# Patient Record
Sex: Female | Born: 1945 | Race: White | Hispanic: No | Marital: Married | Smoking: Never smoker
Health system: Southern US, Community
[De-identification: ages and names within clinical notes are randomized; demographics above are authoritative.]

## PROBLEM LIST (undated history)

## (undated) DIAGNOSIS — F419 Anxiety disorder, unspecified: Secondary | ICD-10-CM

## (undated) DIAGNOSIS — I10 Essential (primary) hypertension: Secondary | ICD-10-CM

## (undated) DIAGNOSIS — F32A Depression, unspecified: Secondary | ICD-10-CM

## (undated) DIAGNOSIS — E673 Hypervitaminosis D: Secondary | ICD-10-CM

## (undated) DIAGNOSIS — N3091 Cystitis, unspecified with hematuria: Secondary | ICD-10-CM

## (undated) DIAGNOSIS — M810 Age-related osteoporosis without current pathological fracture: Secondary | ICD-10-CM

## (undated) DIAGNOSIS — G2581 Restless legs syndrome: Secondary | ICD-10-CM

## (undated) DIAGNOSIS — E039 Hypothyroidism, unspecified: Secondary | ICD-10-CM

## (undated) DIAGNOSIS — E7800 Pure hypercholesterolemia, unspecified: Secondary | ICD-10-CM

## (undated) DIAGNOSIS — R413 Other amnesia: Secondary | ICD-10-CM

## (undated) DIAGNOSIS — N3001 Acute cystitis with hematuria: Secondary | ICD-10-CM

## (undated) HISTORY — PX: EYE SURGERY: SHX253

---

## 2013-10-05 MED ORDER — HYDROCODONE-ACETAMINOPHEN 5 MG-325 MG TAB
5-325 mg | ORAL | Status: AC
Start: 2013-10-05 — End: 2013-10-05
  Administered 2013-10-05: 14:00:00 via ORAL

## 2013-10-05 MED ORDER — HYDROCODONE-ACETAMINOPHEN 5 MG-325 MG TAB
5-325 mg | ORAL | Status: DC
Start: 2013-10-05 — End: 2013-10-05

## 2013-10-05 MED ORDER — HYDROMORPHONE (PF) 1 MG/ML IJ SOLN
1 mg/mL | INTRAMUSCULAR | Status: DC
Start: 2013-10-05 — End: 2013-10-05

## 2013-10-05 MED ORDER — HYDROCODONE-ACETAMINOPHEN 5 MG-325 MG TAB
5-325 mg | ORAL_TABLET | ORAL | Status: AC
Start: 2013-10-05 — End: 2013-10-05

## 2013-10-05 MED ORDER — LIDOCAINE/EPINEPHRINE/TETRACAINE TOPICAL SOLUTION
TOPICAL | Status: AC
Start: 2013-10-05 — End: 2013-10-05
  Administered 2013-10-05: 09:00:00 via TOPICAL

## 2013-10-05 MED ORDER — DIAZEPAM 5 MG TAB
5 mg | ORAL | Status: AC
Start: 2013-10-05 — End: 2013-10-05
  Administered 2013-10-05: 14:00:00 via ORAL

## 2013-10-05 MED ORDER — IBUPROFEN 800 MG TAB
800 mg | ORAL | Status: DC
Start: 2013-10-05 — End: 2013-10-05

## 2013-10-05 MED FILL — LIDOCAINE/EPINEPHRINE/TETRACAINE TOPICAL SOLUTION: TOPICAL | Qty: 5

## 2013-10-05 MED FILL — DIAZEPAM 5 MG TAB: 5 mg | ORAL | Qty: 1

## 2013-10-05 MED FILL — IBUPROFEN 800 MG TAB: 800 mg | ORAL | Qty: 1

## 2013-10-05 MED FILL — HYDROCODONE-ACETAMINOPHEN 5 MG-325 MG TAB: 5-325 mg | ORAL | Qty: 1

## 2013-10-05 NOTE — ED Notes (Signed)
MD at bedside for laceration repair.

## 2013-10-05 NOTE — ED Notes (Signed)
Patient returned from MRI and placed back on monitors. Even unlabored breathing on room air. Patient states improved pain at this time.

## 2013-10-05 NOTE — ED Notes (Signed)
Patient fell in the hotel trying to go to bathroom. Patient hit head. Patient unsure if lost loc

## 2013-10-05 NOTE — ED Notes (Signed)
Patient with continued complaint of bilateral arm pain.

## 2013-10-05 NOTE — ED Provider Notes (Signed)
Patient is a 68 y.o. female presenting with fall. The history is provided by the patient.   Fall  The accident occurred less than 1 hour ago. The fall occurred while standing. She fell from a height of ground level. She landed on hard floor. The volume of blood lost was moderate. The point of impact was the head. The pain is at a severity of 8/10. She was ambulatory at the scene. Associated symptoms include laceration (to forehead). Pertinent negatives include no fever, no abdominal pain and no extremity weakness. Loss of consciousness: pt unsure. Treatment on scene includes a c-collar.        Past Medical History   Diagnosis Date   ??? Arthritis    ??? Gastrointestinal disorder      pud   ??? PUD (peptic ulcer disease)    ??? Psychiatric disorder      anxiety depression        History reviewed. No pertinent past surgical history.      History reviewed. No pertinent family history.     History     Social History   ??? Marital Status: MARRIED     Spouse Name: N/A     Number of Children: N/A   ??? Years of Education: N/A     Occupational History   ??? Not on file.     Social History Main Topics   ??? Smoking status: Never Smoker    ??? Smokeless tobacco: Not on file   ??? Alcohol Use: No   ??? Drug Use: No   ??? Sexual Activity: Not on file     Other Topics Concern   ??? Not on file     Social History Narrative   ??? No narrative on file                  ALLERGIES: Codeine      Review of Systems   Constitutional: Negative for fever and chills.   Respiratory: Negative for shortness of breath, wheezing and stridor.    Cardiovascular: Negative for palpitations.   Gastrointestinal: Negative for abdominal pain.   Musculoskeletal: Positive for arthralgias (pt has pain diffusely through left arm but she is able to fully range the joint.). Negative for extremity weakness.   Skin: Negative.    Neurological: Loss of consciousness: pt unsure.        Prickly sensation to her arms bilaterally.   All other systems reviewed and are negative.      Filed Vitals:     10/05/13 0353   BP: 154/81   Pulse: 69   Temp: 97.9 ??F (36.6 ??C)   Resp: 16   Height: 5\' 6"  (1.676 m)   Weight: 58.968 kg (130 lb)   SpO2: 100%            Physical Exam   Constitutional: She is oriented to person, place, and time. She appears well-developed and well-nourished. No distress.   HENT:   Head: Normocephalic.   Stellite laceration to the forehead.  No active bleeding.   Eyes: Conjunctivae are normal. Right eye exhibits no discharge. Left eye exhibits no discharge.   Neck:   In ccollar no point tenderness minimal tenderness diffusely.   Pulmonary/Chest: Effort normal. No stridor. No respiratory distress.   Musculoskeletal: Normal range of motion. She exhibits no edema. Tenderness: minimal left forearm tenderness.  Full range of motion of the left arm at all joints.   Neurological: She is alert and oriented to person, place, and time.  No focal weakness speech normal   Skin: Skin is warm and dry. Laceration (to forehead) noted.   Psychiatric: She has a normal mood and affect. Her behavior is normal.   Nursing note and vitals reviewed.       MDM  Number of Diagnoses or Management Options  Diagnosis management comments: Pt has stellite laceration to forehead that was repaired.  She continued to have some paresthesias in her hands that have persisted ct normal I have ordered an MRI to evaluate for central cord.       Amount and/or Complexity of Data Reviewed  Tests in the radiology section of CPT??: ordered and reviewed        Wound Repair  Date/Time: 10/05/2013 5:40 AM  Performed by: attendingPreparation: skin prepped with Betadine  Location details: face  Wound length: 3 cm stellite laceration.  Local anesthetic: lidocaine 1% without epinephrine and LET (lido,epi,tetracaine)  Foreign bodies: no foreign bodies  Irrigation solution: saline  Skin closure: 5-0 nylon  Number of sutures: 8  Technique: complex  Approximation: close  Lip approximation: vermillion border well aligned  Patient tolerance: Patient  tolerated the procedure well with no immediate complications  My total time at bedside, performing this procedure was 31-45 minutes.

## 2013-10-05 NOTE — ED Notes (Signed)
Patient taken to MRI patient remains supine and C collar intact.

## 2013-10-05 NOTE — ED Notes (Signed)
Per verbal orders CCollar removed patient sitting upright in stretcher. Patient given meal tray at this time.

## 2013-10-05 NOTE — ED Notes (Signed)
Patient lying supine on stretcher. Even unlabored breathing on room air. Patient denies any new numbness or tingling but continues to c/o bilateral arm pain when she raises her arms. Patient remains in C Collar precautions. Patient daughter remains at bedside. Patient and daughter deny any needs at this time.

## 2013-10-05 NOTE — ED Notes (Signed)
Waiting on disposition.

## 2013-10-05 NOTE — ED Notes (Signed)
I have reviewed discharge instructions with the patient.  The patient verbalized understanding.  Pt cleaned of dried blood from hands and face, neosporin placed to laceration to forehead.  Pt assisted to her daughters car.

## 2018-03-30 LAB — BASIC METABOLIC PANEL
Anion Gap: 11 mmol/L (ref 2–17)
BUN: 31 mg/dL — ABNORMAL HIGH (ref 8–23)
CO2: 29 mmol/L (ref 22–29)
Calcium: 9.5 mg/dL (ref 8.8–10.2)
Chloride: 100 mmol/L (ref 98–107)
Creatinine: 0.6 mg/dL (ref 0.5–0.9)
GFR African American: >60
GFR Non-African American: >60
Glucose: 106 mg/dL — ABNORMAL HIGH (ref 70–99)
Potassium: 4.1 mmol/L (ref 3.5–5.3)
Sodium: 140 mmol/L (ref 135–145)

## 2018-03-30 LAB — CBC WITH AUTO DIFFERENTIAL
Absolute Eos #: 0 10*3/uL (ref 0.0–0.5)
Absolute Lymph #: 1.8 10*3/uL (ref 1.0–3.2)
Absolute Mono #: 0.5 10*3/uL (ref 0.3–1.0)
Basophils %: 0.5 % (ref 0.0–2.0)
Eosinophils %: 0.9 % (ref 0.0–7.0)
Hematocrit: 34.1 % (ref 34.0–47.0)
Hemoglobin: 11.4 g/dL — ABNORMAL LOW (ref 11.5–15.7)
Lymphocytes: 36.9 % (ref 15.0–45.0)
MCH: 31.5 pg (ref 27.0–34.5)
MCHC: 33.5 g/dL (ref 32.0–36.0)
MCV: 94.1 fL (ref 81.0–99.0)
MPV: 8.4 fL (ref 7.2–11.2)
Monocytes: 10.9 % (ref 4.0–12.0)
Neutrophils %: 50.8 % (ref 42.0–74.0)
Neutrophils Absolute: 2.4 10*3/uL (ref 1.6–7.3)
Platelets: 270 10*3/uL (ref 140–440)
RBC: 3.63 x10e6/mcL (ref 3.60–5.20)
RDW: 13.4 % (ref 11.0–16.0)
WBC: 4.8 10*3/uL (ref 3.8–10.6)

## 2018-05-18 LAB — CBC
Hematocrit: 29.2 % — ABNORMAL LOW (ref 34.0–47.0)
Hemoglobin: 10.1 g/dL — ABNORMAL LOW (ref 11.5–15.7)
MCH: 31.7 pg (ref 27.0–34.5)
MCHC: 34.5 g/dL (ref 32.0–36.0)
MCV: 91.8 fL (ref 81.0–99.0)
MPV: 7.2 fL (ref 7.2–11.2)
Platelets: 643 10*3/uL — ABNORMAL HIGH (ref 140–440)
RBC: 3.18 x10e6/mcL — ABNORMAL LOW (ref 3.60–5.20)
RDW: 14.6 % (ref 11.0–16.0)
WBC: 8.1 10*3/uL (ref 3.8–10.6)

## 2018-05-18 LAB — IRON AND TIBC
Iron Saturation: 16 % — ABNORMAL LOW (ref 20–40)
Iron: 47 ug/dL (ref 37–145)
TIBC: 298 ug/dL (ref 250–450)
UIBC: 251 ug/dL (ref 112.0–347.0)

## 2019-01-18 LAB — CBC WITH AUTO DIFFERENTIAL
Absolute Baso #: 0 10*3/uL (ref 0.0–0.2)
Absolute Eos #: 0.1 10*3/uL (ref 0.0–0.5)
Absolute Lymph #: 2.1 10*3/uL (ref 1.0–3.2)
Absolute Mono #: 0.6 10*3/uL (ref 0.3–1.0)
Basophils %: 0.2 % (ref 0.0–2.0)
Eosinophils %: 1.2 % (ref 0.0–7.0)
Hematocrit: 34 % (ref 34.0–47.0)
Hemoglobin: 11.4 g/dL — ABNORMAL LOW (ref 11.5–15.7)
Immature Grans (Abs): 0 10*3/uL
Immature Granulocytes: 0.4 %
Lymphocytes: 38 % (ref 15.0–45.0)
MCH: 29.8 pg (ref 27.0–34.5)
MCHC: 33.5 g/dL (ref 32.0–36.0)
MCV: 89 fL (ref 81.0–99.0)
MPV: 9.8 fL (ref 7.2–13.2)
Monocytes: 10.5 % (ref 4.0–12.0)
NRBC Absolute: 0 10*3/uL
NRBC Automated: 0 %
Neutrophils %: 49.7 % (ref 42.0–74.0)
Neutrophils Absolute: 2.8 10*3/uL (ref 1.6–7.3)
Platelets: 304 10*3/uL (ref 140–440)
RBC: 3.82 x10e6/mcL (ref 3.60–5.20)
RDW: 13.2 % (ref 11.0–16.0)
WBC: 5.6 10*3/uL (ref 3.8–10.6)

## 2019-01-18 LAB — LIPID PANEL
Chol/HDL Ratio: 2.7 (ref 0.0–4.4)
Cholesterol: 182 mg/dL (ref 100–200)
HDL: 67 mg/dL (ref 65–96)
LDL Cholesterol: 97.2 mg/dL (ref 0.0–100.0)
LDL/HDL Ratio: 1.5
Triglycerides: 89 mg/dL (ref 0–149)
VLDL: 17.8 mg/dL (ref 5.0–40.0)

## 2019-01-18 LAB — COMPREHENSIVE METABOLIC PANEL
ALT: 29 U/L (ref 0–33)
AST: 29 U/L (ref 0–32)
Albumin/Globulin Ratio: 2.1 mmol/L — ABNORMAL HIGH (ref 1.00–2.00)
Albumin: 4.7 g/dL (ref 3.5–5.2)
Alk Phosphatase: 85 U/L (ref 35–117)
Anion Gap: 9 mmol/L (ref 2–17)
BUN: 23 mg/dL (ref 8–23)
CO2: 30 mmol/L — ABNORMAL HIGH (ref 22–29)
Calcium: 9.6 mg/dL (ref 8.8–10.2)
Chloride: 100 mmol/L (ref 98–107)
Creatinine: 0.7 mg/dL (ref 0.5–0.9)
GFR African American: 100 mL/min/{1.73_m2} (ref >=90)
GFR Non-African American: 86 mL/min/{1.73_m2} — ABNORMAL LOW (ref >=90)
Globulin: 2 g/dL (ref 1.9–4.4)
Glucose: 112 mg/dL — ABNORMAL HIGH (ref 70–99)
OSMOLALITY CALCULATED: 282 mOsm/kg (ref 270–287)
Potassium: 4.2 mmol/L (ref 3.5–5.3)
Sodium: 139 mmol/L (ref 135–145)
Total Bilirubin: 0.3 mg/dL (ref 0.00–1.20)
Total Protein: 6.9 g/dL (ref 6.4–8.3)

## 2019-01-18 LAB — HEMOGLOBIN A1C
Est. Avg. Glucose, WB: 123
Est. Avg. Glucose-calculated: 133
Hemoglobin A1C: 5.9 % (ref 4.0–6.0)

## 2019-01-18 LAB — TSH WITH REFLEX TO FT4: TSH: 0.141 mcIU/mL — ABNORMAL LOW (ref 0.358–3.740)

## 2019-01-19 LAB — T4, FREE: T4 Free: 1.75 ng/dL — ABNORMAL HIGH (ref 0.82–1.70)

## 2019-02-14 LAB — PAP IG, LIQUID-BASED RFX APTIMA HPV WHEN HPV ASCU 16/18,45 (199340): .: 0

## 2019-04-23 LAB — VITAMIN D 25 HYDROXY: Vit D, 25-Hydroxy: 120.1 ng/mL — ABNORMAL HIGH (ref 30.0–90.0)

## 2019-04-23 LAB — PTH, INTACT: Pth Intact: 28.9 pg/mL (ref 15.00–65.00)

## 2019-04-23 LAB — MAGNESIUM: Magnesium: 2.1 mg/dL (ref 1.6–2.6)

## 2019-04-25 LAB — ELECTROPHORESIS PROTEIN, SERUM
Albumin Electrophoresis: 4.2 g/dL (ref 2.9–4.4)
Albumin/Globulin Ratio: 1.6 (ref 0.7–1.7)
Alpha 1: 0.2 g/dL (ref 0.0–0.4)
Alpha 2: 0.7 g/dL (ref 0.4–1.0)
Beta Globulin: 0.9 g/dL (ref 0.7–1.3)
Gamma Globulin: 0.8 g/dL (ref 0.4–1.8)
Globulin: 2.6 g/dL (ref 2.2–3.9)
Total Protein: 6.8 g/dL (ref 6.0–8.5)

## 2019-05-01 LAB — BASIC METABOLIC PANEL
Anion Gap: 9 mmol/L (ref 2–17)
BUN: 24 mg/dL — ABNORMAL HIGH (ref 8–23)
CO2: 30 mmol/L — ABNORMAL HIGH (ref 22–29)
Calcium: 9.5 mg/dL (ref 8.8–10.2)
Chloride: 102 mmol/L (ref 98–107)
Creatinine: 0.6 mg/dL (ref 0.5–0.9)
GFR African American: 105 mL/min/{1.73_m2} (ref >=90)
GFR Non-African American: 90 mL/min/{1.73_m2} (ref >=90)
Glucose: 101 mg/dL — ABNORMAL HIGH (ref 70–99)
Osmolaliy Calculated: 286 mosm/kg (ref 270–287)
Potassium: 4.5 mmol/L (ref 3.5–5.3)
Sodium: 141 mmol/L (ref 135–145)

## 2020-02-12 LAB — FECAL DNA COLORECTAL CANCER SCREENING (COLOGUARD)

## 2020-02-19 LAB — CBC WITH AUTO DIFFERENTIAL
Absolute Baso #: 0 10*3/uL (ref 0.0–0.2)
Absolute Eos #: 0.1 10*3/uL (ref 0.0–0.5)
Absolute Lymph #: 1.9 10*3/uL (ref 1.0–3.2)
Absolute Mono #: 0.5 10*3/uL (ref 0.3–1.0)
Basophils %: 0.6 % (ref 0.0–2.0)
Eosinophils %: 1.9 % (ref 0.0–7.0)
Hematocrit: 37.2 % (ref 34.0–47.0)
Hemoglobin: 12 g/dL (ref 11.5–15.7)
Immature Grans (Abs): 0.01 10*3/uL (ref 0.00–0.06)
Immature Granulocytes: 0.2 % (ref 0.1–0.6)
Lymphocytes: 36.1 % (ref 15.0–45.0)
MCH: 29.1 pg (ref 27.0–34.5)
MCHC: 32.3 g/dL (ref 32.0–36.0)
MCV: 90.3 fL (ref 81.0–99.0)
MPV: 10.1 fL (ref 7.2–13.2)
Monocytes: 10.4 % (ref 4.0–12.0)
NRBC Absolute: 0 10*3/uL (ref 0.000–0.012)
NRBC Automated: 0 % (ref 0.0–0.2)
Neutrophils %: 50.8 % (ref 42.0–74.0)
Neutrophils Absolute: 2.6 10*3/uL (ref 1.6–7.3)
Platelets: 293 10*3/uL (ref 140–440)
RBC: 4.12 x10e6/mcL (ref 3.60–5.20)
RDW: 14.9 % (ref 11.0–16.0)
WBC: 5.2 10*3/uL (ref 3.8–10.6)

## 2020-02-19 LAB — COMPREHENSIVE METABOLIC PANEL
ALT: 31 U/L (ref 0–33)
AST: 34 U/L — ABNORMAL HIGH (ref 0–32)
Albumin/Globulin Ratio: 2.3 mmol/L — ABNORMAL HIGH (ref 1.00–2.00)
Albumin: 4.6 g/dL (ref 3.5–5.2)
Alk Phosphatase: 93 U/L (ref 35–117)
Anion Gap: 8 mmol/L (ref 2–17)
BUN: 15 mg/dL (ref 8–23)
CO2: 30 mmol/L — ABNORMAL HIGH (ref 22–29)
Calcium: 9.2 mg/dL (ref 8.8–10.2)
Chloride: 104 mmol/L (ref 98–107)
Creatinine: 0.7 mg/dL (ref 0.5–1.0)
GFR African American: 99 mL/min/{1.73_m2} (ref >=90)
GFR Non-African American: 85 mL/min/{1.73_m2} — ABNORMAL LOW (ref >=90)
Globulin: 2 g/dL (ref 1.9–4.4)
Glucose: 96 mg/dL (ref 70–99)
OSMOLALITY CALCULATED: 284 mOsm/kg (ref 270–287)
Potassium: 4.2 mmol/L (ref 3.5–5.3)
Sodium: 142 mmol/L (ref 135–145)
Total Bilirubin: 0.4 mg/dL (ref 0.00–1.20)
Total Protein: 6.6 g/dL (ref 6.4–8.3)

## 2020-02-19 LAB — TSH WITH REFLEX TO FT4: TSH: 2.14 mcIU/mL (ref 0.358–3.740)

## 2020-04-01 LAB — FECAL DNA COLORECTAL CANCER SCREENING (COLOGUARD): FIT-DNA (Cologuard): NEGATIVE

## 2020-07-08 LAB — BASIC METABOLIC PANEL
Anion Gap: 14 mmol/L (ref 2–17)
BUN: 21 mg/dL (ref 8–23)
CO2: 28 mmol/L (ref 22–29)
Calcium: 9.3 mg/dL (ref 8.8–10.2)
Chloride: 100 mmol/L (ref 98–107)
Creatinine: 0.6 mg/dL (ref 0.5–1.0)
GFR African American: 104 mL/min/{1.73_m2} (ref >=90)
GFR Non-African American: 90 mL/min/{1.73_m2} (ref >=90)
Glucose: 93 mg/dL (ref 70–99)
Osmolaliy Calculated: 286 mosm/kg (ref 270–287)
Potassium: 4.3 mmol/L (ref 3.5–5.3)
Sodium: 142 mmol/L (ref 135–145)

## 2020-07-08 LAB — PTH, INTACT: Pth Intact: 33.61 pg/mL (ref 15.00–65.00)

## 2020-07-09 LAB — VITAMIN D 25 HYDROXY: Vit D, 25-Hydroxy: 128.6 ng/mL — ABNORMAL HIGH (ref 30.0–90.0)

## 2021-04-02 NOTE — Telephone Encounter (Signed)
-----   Message from Kara Pacer, [place] sent at 03/19/2021  8:53 AM EDT -----  Regarding: 2021/04/02 check order DEXA; 2021/07/22 due Reclast x 2 trans evenity; DEXA 04/02/19  Refer: [name]  DEXA: 04/02/19   Labs: ordered   Prolia: Covered 100% after $198 deductible, NO PA (verified 03/2019)   Evenity: Covered 100% after $198 deductible, NO PA (verified 03/2019)   Referral notes: Please eval and tx for osteoporosis with prolia   Reather Converse 04/04/2019 12:46:43 PM EDT > LVM X1 to schedule NP apt. Pt will need to complete labs prior to apt.   Reather Converse 04/10/2019 11:52:10 AM EDT > [place] Pt, Pt scheduled for NP apt on 09/09 at 11:00 am. Pt to advised to complete labs prior to apt. Per Dr. Timmie [name] to discuss Prolia as treatment option   Dorthie Santini B, PA 05/01/2019 11:57:39 AM EDT > Evenity will be ordered as soon as BMP results.  Ozie Dimaria B, PA 05/01/2019 04:49:14 PM EDT > BMP resulted. Please send Evenity order.   Reather Converse 05/02/2019 09:12:31 AM EDT > Evenity order faxed. LVM x1 to advise patient of normal results, and that the referral has been faxed to [facility].  Reather Converse 05/02/2019 04:44:31 PM EDT > Referral received at [facility] per fax.  Reather Converse 05/06/2019 03:09:13 PM EDT > Pt scheduled for Evenity on 09/23  Reather Converse 05/17/2019 11:18:05 AM EDT > Evenity given 09/23  Raliyah Montella B, PA 06/21/2019 01:21:15 PM EDT > Evenity given 10/28  Tnya Ades B, PA 07/24/2019 12:49:31 PM EST > 07/24/27 Evenity  [name],Diana W 10/03/2019 03:52:08 PM EST > Evenity 2/10 next 3/17  [name],Diana  11/08/2019 08:13:19 AM EDT > Evenity given 11/06/19 next 12/11/19  [name],Diana  01/16/2020 09:54:45 AM EDT > Evenity given 01/15/20 next 02/19/20  [name],Diana  03/25/2020 03:59:38 PM EDT > Evenity given 03/25/20 next 04/30/20  [name],Diana  05/01/2020 08:48:20 AM EDT > Evenity given 04/30/20 next 06/03/20  Adrieana Fennelly B, PA  05/14/2020 04:27:54 PM EDT > please set her up for an Evenity followup in November. PLease let her know that I have ordered labs prior to that visit.  [name],Diana  05/16/2020 02:08:45 PM > Scheduled 11/17@11 :30am MP pt advised of labs  [facility]  06/04/2020 01:37:38 PM > Evenity #12 given 06/03/20; pt follow up appt 07/08/20  Nevea Spiewak B, PA 07/09/2020 10:01:12 AM > LVM about labs, decrease vitamin D. please send reclast  [name],Diana  07/09/2020 03:12:25 PM > reclast faxed  Rudean Icenhour B, PA 08/05/2020 03:42:56 PM > Reclast given 08/05/20

## 2021-04-07 ENCOUNTER — Encounter

## 2021-04-22 ENCOUNTER — Encounter

## 2021-04-22 ENCOUNTER — Telehealth

## 2021-04-22 LAB — TSH WITH REFLEX TO FT4: TSH: 0.947 mcIU/mL (ref 0.358–3.740)

## 2021-04-22 NOTE — Telephone Encounter (Signed)
Patient is requesting refill  What pharmacy would you like this sent to?[facility]   Have there been any medication changes since your last visit? No    Medication:levothyroxine  Dosage:125 mcg tablet  Frequency:one tablet daily   Quantity:90 day supply

## 2021-04-22 NOTE — Telephone Encounter (Signed)
Per protocol criteria not met. Labs and/or OV > 12 months. Sent to practice for provider discretion.

## 2021-04-22 NOTE — Telephone Encounter (Signed)
Thanks

## 2021-04-22 NOTE — Telephone Encounter (Signed)
Called and left a vm for Pt to call Me  back due to not having labs done since last year  June for her thyroid

## 2021-04-28 NOTE — Telephone Encounter (Signed)
Please remind her to have DEXA scheduled

## 2021-05-26 ENCOUNTER — Encounter

## 2021-05-26 MED ORDER — INFLUENZA VAC HIGH-DOSE QUAD 0.7 ML IM SUSY
0.7 ML | Freq: Once | INTRAMUSCULAR | Status: AC
Start: 2021-05-26 — End: 2021-05-26
  Administered 2021-05-26: 15:00:00 0.7 mL via INTRAMUSCULAR

## 2021-05-29 ENCOUNTER — Telehealth

## 2021-05-29 NOTE — Telephone Encounter (Signed)
Please set her for a VV reclast followup. Labs orderd. DEXA ordered.

## 2021-06-11 ENCOUNTER — Encounter

## 2021-06-11 NOTE — Telephone Encounter (Signed)
Patient is requesting refill  What pharmacy would you like this sent to? [facility]  Have there been any medication changes since your last visit? No     Medication:Pramipexole  Dosage:0.5mg   Frequency:1.5 tab at bedtime  Quantity:90    Was an appointment offered? no  If the patient declined please state why.

## 2021-06-11 NOTE — Telephone Encounter (Signed)
No protocol for this medication/diagnosis, sent to office.

## 2021-06-14 MED ORDER — PRAMIPEXOLE DIHYDROCHLORIDE 0.5 MG PO TABS
0.5 MG | ORAL_TABLET | ORAL | 1 refills | Status: DC
Start: 2021-06-14 — End: 2021-12-17

## 2021-06-18 LAB — BASIC METABOLIC PANEL
Anion Gap: 10 mmol/L (ref 2–17)
BUN: 15 mg/dL (ref 8–23)
CO2: 28 mmol/L (ref 22–29)
Calcium: 8.9 mg/dL (ref 8.8–10.2)
Chloride: 100 mmol/L (ref 98–107)
Creatinine: 0.8 mg/dL (ref 0.5–1.0)
Est, Glom Filt Rate: 77 mL/min/1.73m?? — ABNORMAL LOW (ref >=90)
Glucose: 87 mg/dL (ref 70–99)
OSMOLALITY CALCULATED: 276 mOsm/kg (ref 270–287)
Potassium: 3.9 mmol/L (ref 3.5–5.3)
Sodium: 138 mmol/L (ref 135–145)

## 2021-06-18 LAB — PTH, INTACT: Pth Intact: 34.4 pg/mL (ref 15.00–65.00)

## 2021-06-18 LAB — VITAMIN D 25 HYDROXY: Vit D, 25-Hydroxy: 101 ng/mL — ABNORMAL HIGH (ref 30.0–90.0)

## 2021-06-21 NOTE — Other (Signed)
Beautiful gains. Please call [phone] to schedule a visit to review your labs and DEXA,

## 2021-06-21 NOTE — Telephone Encounter (Signed)
ERROR

## 2021-06-21 NOTE — Other (Signed)
Decrease vitamin D to 2000 IU. Please call [phone] to schedule a visit to review your labs and DEXA,

## 2021-06-21 NOTE — Telephone Encounter (Signed)
Please set a VV for DEXA review, labs review and she is due for Reclast.

## 2021-07-07 ENCOUNTER — Telehealth: Admit: 2021-07-07 | Discharge: 2021-07-07 | Payer: MEDICARE | Attending: Medical

## 2021-07-07 ENCOUNTER — Encounter: Attending: Medical

## 2021-07-07 DIAGNOSIS — M810 Age-related osteoporosis without current pathological fracture: Secondary | ICD-10-CM

## 2021-07-07 NOTE — Progress Notes (Signed)
[name]   Established Patient Note  Roney Jaffe, PA-C    HISTORY  Diana [name] is a 75 y.o. female who presents for continued osteoporosis management.     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     This was a phone only virtual visit and the patient gave consent.    RELEVANT REVIEW OF SYSTEMS IS NOTED IN THE HPI    Not on File     No current outpatient medications on file.     No current facility-administered medications for this visit.       No past medical history on file.     No past surgical history on file.    No family history on file.    Social History     Socioeconomic History    Marital status: Married     Spouse name: Not on file    Number of children: Not on file    Years of education: Not on file    Highest education level: Not on file   Occupational History    Not on file   Tobacco Use    Smoking status: Not on file    Smokeless tobacco: Not on file   Substance and Sexual Activity    Alcohol use: Not on file    Drug use: Not on file    Sexual activity: Not on file   Other Topics Concern    Not on file   Social History Narrative    Not on file     Social Determinants of Health     Financial Resource Strain: Not on file   Food Insecurity: Not on file   Transportation Needs: Not on file   Physical Activity: Not on file   Stress: Not on file   Social Connections: Not on file   Intimate Partner Violence: Not on file   Housing Stability: Not on file        PHYSICAL EXAM   Not done, since this is a virtual visit.    DIAGNOSIS    ICD-10-CM    1. Age-related osteoporosis without current pathological fracture  M81.0           PLAN  Osteoporosis ( femoral neck, total hip)  DEXA ( 06/18/21)  Full labs ( 04/23/19)  Fragility fracture  ( hip, 2016)  Fall risk ( moderate)  Evenity (completed 06/03/20)  Reclast (08/05/20)    DEXA from 2018 and 2020 and 2022 were reviewed.   Lumbar T score of -0.9, 1.9 with 32.3% gain  Femoral neck T score of --2.4, -2.9, -2.5 with 7.6% gain  Total hip T score of -2.3 -3.0, -2.6 with 7.9% gain  Beautiful response to Evenity and Reclast.      Labs reviewed. We discussed how each of these can contribute to osteoporosis.   BMP normal renal and calcium levels. Creatinine clearance was calculated to be at least 71mL/min.  PTH normal.  Vitamin D too high. She will cut back to 2500 per day.     We discussed that calcium intake should be 1000-1200mg  a day and vitamin D should be 2000IU, which can come from diet or supplementation.     We discussed that 150 minutes of  weight bearing exercise a week is recommended to strengthen bones naturally. Both weight bearing and core muscle strengthening were recommended to improve bone quality and reduce falls. This can include walking, yoga, pilates, weight or resistance training. If the patient feels unsafe to do these, alternative weight bearing exercises can be done sitting with ankle weights and hand weights.    This patient has done well with prior Reclast, and the next dose will be ordered.    Reclast counseling included: once a year IV infusion, contraindicated if renal disease, atypical femoral fractures, and osteonecrosis of the jaw. Acute phase reaction was discussed and the patient can reduce symptoms by taking APAP and an antihistamine the morning of the infusion and continuing the next day, and taking 5000IU Vitamin D3 every day the week prior to the infusion. We discussed the estimated 58% spine fracture reduction and 26% hip fracture reduction on this medication over a 5 year course of therapy.     The plan for this patient is vitamin D and calcium supplementation, healthy lifestyle choices, weight bearing exercise, and bisphosphonates for 5 years total. Follow up in 1  year. All patient questions were answered and they are in agreement with this plan.     AUDIO ONLY VIRTUAL VISIT    Total Time spent for this encounter: minutes: 5-10 minutes, 8 minutes.    Diana [name] is a 75 y.o. female evaluated via telephone on 07/07/2021 was evaluated through a synchronous (real-time) audio encounter. Patient identification was verified at the start of the visit. She (or guardian if applicable) is aware that this is a billable service, which includes applicable co-pays. This visit was conducted with the patient's (and/or legal guardian's) verbal consent. She has not had a related appointment within my department in the past 7 days or scheduled within the next 24 hours.   The patient was located at Home: [address]  [place] [address].  The provider was located at Facility (Appt Dept): [address] [place], [address]  [place],  [place] [address].        -Kara Pacer, PA-C  Program Director  [facility]    --Kara Pacer, [place] on 07/07/2021 at 8:16 AM  An electronic signature was used to authenticate this note.

## 2021-07-07 NOTE — Telephone Encounter (Signed)
Please send Reclast to PI

## 2021-11-02 ENCOUNTER — Encounter

## 2021-11-24 DIAGNOSIS — R195 Other fecal abnormalities: Secondary | ICD-10-CM | POA: Diagnosis not present

## 2021-11-24 DIAGNOSIS — M8580 Other specified disorders of bone density and structure, unspecified site: Secondary | ICD-10-CM | POA: Diagnosis not present

## 2021-11-24 DIAGNOSIS — E559 Vitamin D deficiency, unspecified: Secondary | ICD-10-CM | POA: Diagnosis not present

## 2021-11-24 DIAGNOSIS — M8589 Other specified disorders of bone density and structure, multiple sites: Secondary | ICD-10-CM | POA: Diagnosis not present

## 2021-12-08 ENCOUNTER — Encounter

## 2021-12-08 NOTE — Telephone Encounter (Signed)
Medication and/or diagnosis excluded from protocol, sent to office to fill

## 2021-12-16 ENCOUNTER — Encounter

## 2021-12-16 NOTE — Telephone Encounter (Signed)
Patient is requesting refill  pramipexole (MIRAPEX) 0.5 MG tablet  1.5 tablet at bedtime orally 90 days  90 DS  LV-05/26/21  NV-02/23/22    [facility]  [phone]

## 2021-12-16 NOTE — Telephone Encounter (Signed)
No protocol for this medication/diagnosis, sent to office.

## 2021-12-17 MED ORDER — PRAMIPEXOLE DIHYDROCHLORIDE 0.5 MG PO TABS
0.5 MG | ORAL_TABLET | ORAL | 3 refills | Status: AC
Start: 2021-12-17 — End: 2022-02-23

## 2021-12-24 ENCOUNTER — Ambulatory Visit (INDEPENDENT_AMBULATORY_CARE_PROVIDER_SITE_OTHER): Payer: Medicare Other | Admitting: Student in an Organized Health Care Education/Training Program

## 2021-12-24 VITALS — BP 156/94 | HR 70 | Temp 98.2°F | Ht 62.0 in | Wt 108.0 lb

## 2021-12-24 DIAGNOSIS — F331 Major depressive disorder, recurrent, moderate: Secondary | ICD-10-CM | POA: Diagnosis not present

## 2021-12-24 DIAGNOSIS — F411 Generalized anxiety disorder: Secondary | ICD-10-CM | POA: Diagnosis not present

## 2021-12-24 NOTE — Progress Notes (Signed)
BH MD/PA/NP OP Progress Note ? ?12/25/2021 6:17 AM ?Caitlyn [name]  ?MRN:  [id number] ? ?Chief Complaint:  ?Chief Complaint  ?Patient presents with  ? Depression  ? ?HPI:  ?Caitlyn [name] is a 76 yr old female who presents for evaluation for TMS.  PPHx is significant for depression and anxiety. ? ?She presents today with her daughter in law.  They report that patient has been under significant stressors having been hospitalized afew times in the last year and her husbands Parkinsons having advanced to the point where eating is difficult and needing full time care.  She has had to move to [facility] because she would be near family and have an independent living facility and a facility for her husband.  She reports she has had a significant decline in her activity since moving here, reporting playing tennis multiple times a week prior and cooking for herself which she now does neither.   ? ?She has been on multiple medications with only minimal response- Effexor, Prozac, Zyprexa, Abilify, Remeron, Xanax, and Klonopin.  She is currently on Prozac, Remeron, Abilify, and Klonopin.  She has lost about 15 lbs total due to decreased appetite. ? ?Discussed risks and benefits of TMS.  Discussed where in the brain TMS targets and what treatment consists of and for how long.  All questions where asked and answered.She reported continuing to have some hesitation because she worries it would not be effective.  Discussed that data shows approximately 70% of patients find success but that it is possible it could not be effective.  She reported she would further consider it. ? ?She reports no SI, HI, or AVH.  She reports no other concerns at present. ? ? ?Visit Diagnosis:  ?  ICD-10-CM   ?1. MDD (major depressive disorder), recurrent episode, moderate (HCC)  F33.1   ?  ?2. GAD (generalized anxiety disorder)  F41.1   ?  ? ? ?Past Psychiatric History: Depression, Anxiety ? ?Past Medical History: No past medical history on file.  ? ?Family  Psychiatric History: Reports none ? ?Family History: No family history on file. ? ?Social History:  ?Social History  ? ?Socioeconomic History  ? Marital status: Married  ?  Spouse name: Not on file  ? Number of children: Not on file  ? Years of education: Not on file  ? Highest education level: Not on file  ?Occupational History  ? Not on file  ?Tobacco Use  ? Smoking status: Not on file  ? Smokeless tobacco: Not on file  ?Substance and Sexual Activity  ? Alcohol use: Not on file  ? Drug use: Not on file  ? Sexual activity: Not on file  ?Other Topics Concern  ? Not on file  ?Social History Narrative  ? Not on file  ? ?Social Determinants of Health  ? ?Financial Resource Strain: Not on file  ?Food Insecurity: Not on file  ?Transportation Needs: Not on file  ?Physical Activity: Not on file  ?Stress: Not on file  ?Social Connections: Not on file  ? ? ?Allergies: Not on File ? ?Metabolic Disorder Labs: ?No results found for: HGBA1C, MPG ?No results found for: PROLACTIN ?No results found for: CHOL, TRIG, HDL, CHOLHDL, VLDL, LDLCALC ?No results found for: TSH ? ?Therapeutic Level Labs: ?No results found for: LITHIUM ?No results found for: VALPROATE ?No components found for:  CBMZ ? ?Current Medications: ?No current outpatient medications on file.  ? ?No current facility-administered medications for this visit.  ? ? ? ?Musculoskeletal: ?Strength &  Muscle Tone: decreased ?Gait & Station: normal ?Patient leans: N/A ? ?Psychiatric Specialty Exam: ?Review of Systems  ?Cardiovascular:  Negative for chest pain.  ?Gastrointestinal:  Negative for abdominal pain, constipation, diarrhea, nausea and vomiting.  ?Neurological:  Positive for tremors. Negative for dizziness and weakness.  ?Psychiatric/Behavioral:  Positive for dysphoric mood. Negative for agitation, hallucinations, self-injury and suicidal ideas. The patient is nervous/anxious.    ?Blood pressure (!) 156/94, pulse 70, temperature 98.2 ?F (36.8 ?C), height 5\' 2"  (1.575  m), weight 108 lb (49 kg), SpO2 98 %.Body mass index is 19.75 kg/m?.  ?General Appearance: Casual, Neat, and Well Groomed  ?Eye Contact:  Good  ?Speech:  Clear and Coherent and Normal Rate  ?Volume:  Normal  ?Mood:  Anxious and Depressed  ?Affect:  Congruent, Depressed, and Restricted  ?Thought Process:  Coherent and Goal Directed  ?Orientation:  Full (Time, Place, and Person)  ?Thought Content: Logical   ?Suicidal Thoughts:  No  ?Homicidal Thoughts:  No  ?Memory:  Immediate;   Fair ?Recent;   Fair  ?Judgement:  Fair  ?Insight:  Fair  ?Psychomotor Activity:  Normal  ?Concentration:  Concentration: Good and Attention Span: Good  ?Recall:  Good  ?Fund of Knowledge: Good  ?Language: Good  ?Akathisia:  Negative  ?Handed:  Right  ?AIMS (if indicated): not done  ?Assets:  Communication Skills ?Desire for Improvement ?Housing ?Resilience ?Social Support  ?ADL's:  Intact  ?Cognition: WNL  ?Sleep:  Fair  ? ?Screenings: ? ? ?Assessment and Plan:  ?Ellayna has had significant decrease in her activity due to her depression and anxiety.  She has had poor to minimal response to medications trialed so far.  She would be appropriate to undergo TMS.  She is hesitant to undergo and so will consider and discuss with her family further.  Encouraged her to call if she had any further questions.  If she wishes to proceed she will call the office to schedule.   ? ? ?Collaboration of Care: Case Discussed with Supervising Attending Dr. [name]. ? ?Patient/Guardian was advised Release of Information must be obtained prior to any record release in order to collaborate their care with an outside provider. Patient/Guardian was advised if they have not already done so to contact the registration department to sign all necessary forms in order for Lucianne Muss to release information regarding their care.  ? ?Consent: Patient/Guardian gives verbal consent for treatment and assignment of benefits for services provided during this visit. Patient/Guardian expressed  understanding and agreed to proceed.  ? ? ?[place], MD ?12/25/2021, 6:17 AM ? ?

## 2022-01-07 ENCOUNTER — Emergency Department (HOSPITAL_COMMUNITY): Payer: Medicare Other

## 2022-01-07 ENCOUNTER — Emergency Department (HOSPITAL_COMMUNITY)
Admission: EM | Admit: 2022-01-07 | Discharge: 2022-01-09 | Disposition: A | Discharge status: Other Acute Inpatient Hospital | Payer: Medicare Other | Attending: Emergency Medicine | Admitting: Emergency Medicine

## 2022-01-07 ENCOUNTER — Encounter (HOSPITAL_COMMUNITY): Payer: Self-pay

## 2022-01-07 ENCOUNTER — Other Ambulatory Visit: Payer: Self-pay

## 2022-01-07 DIAGNOSIS — Z20822 Contact with and (suspected) exposure to covid-19: Secondary | ICD-10-CM | POA: Insufficient documentation

## 2022-01-07 DIAGNOSIS — R45851 Suicidal ideations: Secondary | ICD-10-CM | POA: Diagnosis not present

## 2022-01-07 DIAGNOSIS — F331 Major depressive disorder, recurrent, moderate: Secondary | ICD-10-CM | POA: Diagnosis not present

## 2022-01-07 DIAGNOSIS — R9431 Abnormal electrocardiogram [ECG] [EKG]: Secondary | ICD-10-CM | POA: Diagnosis not present

## 2022-01-07 DIAGNOSIS — Z79899 Other long term (current) drug therapy: Secondary | ICD-10-CM | POA: Insufficient documentation

## 2022-01-07 DIAGNOSIS — F419 Anxiety disorder, unspecified: Secondary | ICD-10-CM | POA: Diagnosis not present

## 2022-01-07 DIAGNOSIS — R251 Tremor, unspecified: Secondary | ICD-10-CM | POA: Insufficient documentation

## 2022-01-07 DIAGNOSIS — F332 Major depressive disorder, recurrent severe without psychotic features: Secondary | ICD-10-CM | POA: Diagnosis present

## 2022-01-07 HISTORY — DX: Essential (primary) hypertension: I10

## 2022-01-07 HISTORY — DX: Depression, unspecified: F32.A

## 2022-01-07 HISTORY — DX: Anxiety disorder, unspecified: F41.9

## 2022-01-07 LAB — COMPREHENSIVE METABOLIC PANEL
ALT: 16 U/L (ref 0–44)
AST: 20 U/L (ref 15–41)
Albumin: 4.1 g/dL (ref 3.5–5.0)
Alkaline Phosphatase: 59 U/L (ref 38–126)
Anion gap: 8 (ref 5–15)
BUN: 19 mg/dL (ref 8–23)
CO2: 24 mmol/L (ref 22–32)
Calcium: 9.4 mg/dL (ref 8.9–10.3)
Chloride: 107 mmol/L (ref 98–111)
Creatinine, Ser: 0.63 mg/dL (ref 0.44–1.00)
GFR, Estimated: >60 mL/min (ref >60)
Glucose, Bld: 103 mg/dL — ABNORMAL HIGH (ref 70–99)
Potassium: 3.9 mmol/L (ref 3.5–5.1)
Sodium: 139 mmol/L (ref 135–145)
Total Bilirubin: 0.5 mg/dL (ref 0.3–1.2)
Total Protein: 7.1 g/dL (ref 6.5–8.1)

## 2022-01-07 LAB — RAPID URINE DRUG SCREEN, HOSP PERFORMED
Amphetamines: NOT DETECTED
Barbiturates: NOT DETECTED
Benzodiazepines: NOT DETECTED
Cocaine: NOT DETECTED
Opiates: NOT DETECTED
Tetrahydrocannabinol: NOT DETECTED

## 2022-01-07 LAB — CBC WITH DIFFERENTIAL/PLATELET
Abs Immature Granulocytes: 0.02 10*3/uL (ref 0.00–0.07)
Basophils Absolute: 0.1 10*3/uL (ref 0.0–0.1)
Basophils Relative: 1 %
Eosinophils Absolute: 0.1 10*3/uL (ref 0.0–0.5)
Eosinophils Relative: 1 %
HCT: 42.4 % (ref 36.0–46.0)
Hemoglobin: 14.6 g/dL (ref 12.0–15.0)
Immature Granulocytes: 0 %
Lymphocytes Relative: 25 %
Lymphs Abs: 2.1 10*3/uL (ref 0.7–4.0)
MCH: 31.7 pg (ref 26.0–34.0)
MCHC: 34.4 g/dL (ref 30.0–36.0)
MCV: 92.2 fL (ref 80.0–100.0)
Monocytes Absolute: 0.7 10*3/uL (ref 0.1–1.0)
Monocytes Relative: 9 %
Neutro Abs: 5.5 10*3/uL (ref 1.7–7.7)
Neutrophils Relative %: 64 %
Platelets: 290 10*3/uL (ref 150–400)
RBC: 4.6 MIL/uL (ref 3.87–5.11)
RDW: 12.8 % (ref 11.5–15.5)
WBC: 8.4 10*3/uL (ref 4.0–10.5)
nRBC: 0 % (ref 0.0–0.2)

## 2022-01-07 LAB — RESP PANEL BY RT-PCR (FLU A&B, COVID) ARPGX2
Influenza A by PCR: NEGATIVE
Influenza B by PCR: NEGATIVE
SARS Coronavirus 2 by RT PCR: NEGATIVE

## 2022-01-07 LAB — URINALYSIS, ROUTINE W REFLEX MICROSCOPIC
Bilirubin Urine: NEGATIVE
Glucose, UA: NEGATIVE mg/dL
Ketones, ur: NEGATIVE mg/dL
Nitrite: NEGATIVE
Protein, ur: NEGATIVE mg/dL
Specific Gravity, Urine: 1.005 (ref 1.005–1.030)
pH: 5 (ref 5.0–8.0)

## 2022-01-07 LAB — ETHANOL: Alcohol, Ethyl (B): <10 mg/dL (ref <10)

## 2022-01-07 MED ORDER — ARIPIPRAZOLE 5 MG PO TABS
5.0000 mg | ORAL_TABLET | Freq: Every day | ORAL | Status: DC
  Administered 2022-01-08: 5 mg via ORAL
  Filled 2022-01-07: qty 1

## 2022-01-07 MED ORDER — FLUOXETINE HCL 20 MG PO CAPS
40.0000 mg | ORAL_CAPSULE | ORAL | Status: DC
  Administered 2022-01-09: 40 mg via ORAL
  Filled 2022-01-07: qty 2

## 2022-01-07 MED ORDER — FLUOXETINE HCL 20 MG PO CAPS: 20.0000 mg | ORAL_CAPSULE | ORAL | Status: DC

## 2022-01-07 MED ORDER — LISINOPRIL 10 MG PO TABS
10.0000 mg | ORAL_TABLET | Freq: Every day | ORAL | Status: DC
  Administered 2022-01-08 – 2022-01-09 (×2): 10 mg via ORAL
  Filled 2022-01-07 (×2): qty 1

## 2022-01-07 MED ORDER — CYCLOSPORINE 0.05 % OP EMUL
1.0000 [drp] | Freq: Every day | OPHTHALMIC | Status: DC
  Administered 2022-01-08 – 2022-01-09 (×2): 1 [drp] via OPHTHALMIC
  Filled 2022-01-07 (×3): qty 30

## 2022-01-07 MED ORDER — CLONAZEPAM 0.5 MG PO TABS
1.0000 mg | ORAL_TABLET | Freq: Once | ORAL | Status: AC
  Administered 2022-01-07: 1 mg via ORAL
  Filled 2022-01-07: qty 2

## 2022-01-07 MED ORDER — MIRTAZAPINE 7.5 MG PO TABS
15.0000 mg | ORAL_TABLET | Freq: Every day | ORAL | Status: DC
  Administered 2022-01-08: 15 mg via ORAL
  Filled 2022-01-07: qty 2

## 2022-01-07 MED ORDER — LORAZEPAM 2 MG/ML IJ SOLN: 0.5000 mg | INTRAMUSCULAR | Status: DC | PRN

## 2022-01-07 MED ORDER — CLONAZEPAM 0.5 MG PO TABS
1.0000 mg | ORAL_TABLET | Freq: Two times a day (BID) | ORAL | Status: DC
  Administered 2022-01-08 – 2022-01-09 (×3): 1 mg via ORAL
  Filled 2022-01-07 (×3): qty 2

## 2022-01-07 MED ORDER — MIRTAZAPINE 7.5 MG PO TABS
15.0000 mg | ORAL_TABLET | Freq: Once | ORAL | Status: AC
  Administered 2022-01-07: 15 mg via ORAL
  Filled 2022-01-07: qty 2

## 2022-01-07 MED ORDER — FLUOXETINE HCL 20 MG PO CAPS
20.0000 mg | ORAL_CAPSULE | ORAL | Status: DC
  Administered 2022-01-08: 20 mg via ORAL
  Filled 2022-01-07: qty 1

## 2022-01-07 NOTE — ED Provider Notes (Signed)
[facility]-EMERGENCY DEPT Provider Note   CSN: [id number] Arrival date & time: 01/07/22  1330     History  Chief Complaint  Patient presents with   hand tremor   Anxiety   Depression    Caitlyn [name] is a 76 y.o. female.  HPI She presents for evaluation of sensation of anxiety, and hand tremor.  She has been continually rubbing her hands.  Several days ago she took 6 mirtazapine, because she was feeling bad.  She states she was trying to go to sleep.  According to her son, in the room with the patient, she has been "talking about knives."  Patient denies that she will hurt herself.  She has reportedly had multiple psychiatric hospitalization within the last year.  She does not currently see a psychiatrist.  She lives with her husband at a local retirement community.  He is currently in the emergency department with complications from accidentally removing his feeding tube.  Patient has not had any recent illnesses including fever, vomiting, dysuria, cough or shortness of breath.  She has recently relocated to this community.    Home Medications Prior to Admission medications   Medication Sig Start Date End Date Taking? Authorizing Provider  ARIPiprazole (ABILIFY) 5 MG tablet Take 5 mg by mouth daily.   Yes [provider]  clonazePAM (KLONOPIN) 1 MG tablet Take 1 mg by mouth 2 (two) times daily.   Yes [provider]  cycloSPORINE (RESTASIS) 0.05 % ophthalmic emulsion Place 1 drop into both eyes daily.   Yes [provider]  FLUoxetine (PROZAC) 20 MG capsule Take 20-40 mg by mouth See admin instructions. Takes 20 mg and 40 mg on alternate days.   Yes [provider]  lisinopril (ZESTRIL) 10 MG tablet Take 10 mg by mouth daily.   Yes [provider]  loperamide (IMODIUM A-D) 2 MG tablet 1 tablet as needed 11/24/21  Yes [provider]  mirtazapine (REMERON) 15 MG tablet Take 15 mg by mouth at bedtime.   Yes  [provider]      Allergies    Penicillins    Review of Systems   Review of Systems  Physical Exam Updated Vital Signs BP (!) 142/108   Pulse (!) 59   Temp 98.2 F (36.8 C) (Oral)   Resp 15   Ht 5\' 2"  (1.575 m)   Wt 47.6 kg   SpO2 99%   BMI 19.20 kg/m  Physical Exam Vitals and nursing note reviewed.  Constitutional:      General: She is not in acute distress.    Appearance: She is well-developed. She is not ill-appearing or toxic-appearing.  HENT:     Head: Normocephalic and atraumatic.     Right Ear: External ear normal.     Left Ear: External ear normal.     Nose: No congestion or rhinorrhea.     Mouth/Throat:     Mouth: Mucous membranes are moist.  Eyes:     General:        Right eye: No discharge.        Left eye: No discharge.     Extraocular Movements: Extraocular movements intact.     Conjunctiva/sclera: Conjunctivae normal.     Pupils: Pupils are equal, round, and reactive to light.  Neck:     Trachea: Phonation normal.  Cardiovascular:     Rate and Rhythm: Normal rate and regular rhythm.     Heart sounds: Normal heart sounds.  Pulmonary:  Effort: Pulmonary effort is normal.     Breath sounds: Normal breath sounds.  Abdominal:     General: There is no distension.     Palpations: Abdomen is soft.     Tenderness: There is no abdominal tenderness.  Musculoskeletal:        General: Normal range of motion.     Cervical back: Normal range of motion and neck supple.  Skin:    General: Skin is warm and dry.  Neurological:     Mental Status: She is alert and oriented to person, place, and time.     Cranial Nerves: No cranial nerve deficit.     Sensory: No sensory deficit.     Motor: No abnormal muscle tone.     Coordination: Coordination normal.  Psychiatric:        Mood and Affect: Mood normal.        Behavior: Behavior normal.     Comments: Holding hands together rubbing them onto her left anterior thigh.  She appears anxious.    ED  Results / Procedures / Treatments   Labs (all labs ordered are listed, but only abnormal results are displayed) Labs Reviewed  COMPREHENSIVE METABOLIC PANEL - Abnormal; Notable for the following components:      Result Value   Glucose, Bld 103 (*)    All other components within normal limits  URINALYSIS, ROUTINE W REFLEX MICROSCOPIC - Abnormal; Notable for the following components:   Color, Urine STRAW (*)    Hgb urine dipstick SMALL (*)    Leukocytes,Ua SMALL (*)    Bacteria, UA RARE (*)    All other components within normal limits  RESP PANEL BY RT-PCR (FLU A&B, COVID) ARPGX2  ETHANOL  RAPID URINE DRUG SCREEN, HOSP PERFORMED  CBC WITH DIFFERENTIAL/PLATELET    EKG EKG Interpretation  Date/Time:  Friday Jan 07 2022 15:15:43 EDT Ventricular Rate:  71 PR Interval:  112 QRS Duration: 100 QT Interval:  438 QTC Calculation: 419 R Axis:   70 Text Interpretation: Sinus rhythm Multiform ventricular premature complexes Borderline short PR interval Anteroseptal infarct, age indeterminate No previous ECGs available Confirmed by Mancel Bale [phone]) on 01/07/2022 4:07:08 PM  Radiology CT Head Wo Contrast  Result Date: 01/07/2022 CLINICAL DATA:  Altered mental status following recent anti depression medication ingestion, initial encounter EXAM: CT HEAD WITHOUT CONTRAST TECHNIQUE: Contiguous axial images were obtained from the base of the skull through the vertex without intravenous contrast. RADIATION DOSE REDUCTION: This exam was performed according to the departmental dose-optimization program which includes automated exposure control, adjustment of the mA and/or kV according to patient size and/or use of iterative reconstruction technique. COMPARISON:  None Available. FINDINGS: Brain: Mild atrophic changes are noted. Area of decreased attenuation is noted the midportion of left internal capsule suspicious for subacute to chronic ischemia. No acute hemorrhage or acute infarct is noted.  Vascular: No hyperdense vessel or unexpected calcification. Skull: Normal. Negative for fracture or focal lesion. Sinuses/Orbits: No acute finding. Other: None. IMPRESSION: Changes consistent with subacute to chronic ischemia within the left internal capsule. No other focal abnormality is noted Electronically Signed   By: Alcide Clever M.D.   On: 01/07/2022 18:57   MR BRAIN WO CONTRAST  Result Date: 01/07/2022 CLINICAL DATA:  Altered mental status EXAM: MRI HEAD WITHOUT CONTRAST TECHNIQUE: Multiplanar, multiecho pulse sequences of the brain and surrounding structures were obtained without intravenous contrast. COMPARISON:  None Available. FINDINGS: Brain: No acute infarct, mass effect or extra-axial collection. Focus of chronic  microhemorrhage in the medial left temporal lobe. Minimal multifocal hyperintense T2-weight signal within the white matter. The midline structures are normal. Vascular: Major flow voids are preserved. Skull and upper cervical spine: Normal calvarium and skull base. Visualized upper cervical spine and soft tissues are normal. Sinuses/Orbits:No paranasal sinus fluid levels or advanced mucosal thickening. No mastoid or middle ear effusion. Normal orbits. IMPRESSION: 1. No acute intracranial abnormality. 2. Focus of chronic microhemorrhage in the medial left temporal lobe. Electronically Signed   By: Deatra RobinsonKevin  Herman M.D.   On: 01/07/2022 23:17    Procedures Procedures    Medications Ordered in ED Medications  LORazepam (ATIVAN) injection 0.5 mg (has no administration in time range)  clonazePAM (KLONOPIN) tablet 1 mg (1 mg Oral Given 01/07/22 2230)  mirtazapine (REMERON) tablet 15 mg (15 mg Oral Given 01/07/22 2230)    ED Course/ Medical Decision Making/ A&P                           Medical Decision Making Patient presenting for evaluation of persistent anxiety, worsening despite usual alcohol treatment and recent hospitalizations in psychiatric stability.  She is not currently  seeing psychiatry or getting therapy.  She lives with her elderly husband.  Differential diagnosis includes suicidal ideation with plan, severe anxiety, depression and psychosocial stressors.  She will require comprehensive evaluation including medical screening, and subsequent evaluation by the psychiatry service.  Amount and/or Complexity of Data Reviewed Independent Historian: caregiver    Details: Patient gives most history but it is supplemented by her son, Erasmo DownerBrad Matson, at the bedside. Labs: ordered.    Details: CBC, metabolic panel, alcohol level, urine drug screen, urinalysis, viral panel- Radiology: ordered and independent interpretation performed.    Details: CT head- ECG/medicine tests: ordered and independent interpretation performed.    Details: Cardiac monitor-normal sinus rhythm with occasional PVCs. Discussion of management or test interpretation with external provider(s): Case discussed with neuro hospitalist, he recommends MRI to follow-up on abnormal CT of head, to rule out CVA.  Risk Prescription drug management. Decision regarding hospitalization. Risk Details: Patient presenting with anxiety and suicidal ideation.  Medical screening exam is reassuring.  Possible early dementia.  No evidence for CVA, encephalopathy, UTI.  She requires evaluation by TTS/psychiatry, possibly placement.  Patient is medically cleared for treatment by theatric services.  She does not require medical hospitalization.           Final Clinical Impression(s) / ED Diagnoses Final diagnoses:  Anxiety  Suicidal ideation    Rx / DC Orders ED Discharge Orders     None         Mancel BaleWentz, Caree Wolpert, MD 01/07/22 2354

## 2022-01-07 NOTE — ED Provider Triage Note (Signed)
Emergency Medicine Provider Triage Evaluation Note  Caitlyn [name] , a 76 y.o. female  was evaluated in triage.  Pt complains of anxiety, depression, and hand shaking. Patient presents with her family who states that the patient has been making comments about 'I just cant live like this' and referring to having access to knives in her living facility. Patient also states that on Monday she wanted to go to sleep in the middle of the day and subsequently too 'a handful' of her prescribed Mirtazapine. She repeatedly denies that this was in any attempt to take her life.  She called her doctor afterwards and he told her to just not take her mirtazapine for the next few days which she did.  Patient also states concerned that her right hand has been shaking more than normal.  However, she states that this has been going on for the past several months.  She states that she started taking Abilify at the beginning of the month and she is concerned that this is causing her hand to shake worse.  Patient actively denies any suicidal ideation, however family in the room states that she has been making suicidal comments all week.  History of suicide attempt last fall via medication overdose.  Review of Systems  Positive: Negative: See above  Physical Exam  BP 115/85 (BP Location: Left Arm)   Pulse 84   Temp 98.2 F (36.8 C) (Oral)   Resp 16   Ht 5\' 2"  (1.575 m)   Wt 47.6 kg   SpO2 96%   BMI 19.20 kg/m  Gen:   Awake, no distress   Resp:  Normal effort  MSK:   Moves extremities without difficulty  Other:    Medical Decision Making  Medically screening exam initiated at 2:52 PM.  Appropriate orders placed.  was informed that the remainder of the evaluation will be completed by another provider, this initial triage assessment does not replace that evaluation, and the importance of remaining in the ED until their evaluation is complete.  Medical clearance labs ordered   [name] 01/07/22 1503

## 2022-01-07 NOTE — ED Notes (Signed)
Pt to MRI at this time.

## 2022-01-07 NOTE — ED Notes (Signed)
Pt back from MRI 

## 2022-01-07 NOTE — ED Triage Notes (Addendum)
Patient's son reports that the patient took 6 days worth of of her anti depression medications 4 days ago. Patietient states she was wanting to go to sleep.  Son reports that the patient has had psychiatric admissions for anxiety and depression as well suicidal attempts.  Patient  also c/o right hand tremors.

## 2022-01-08 DIAGNOSIS — F332 Major depressive disorder, recurrent severe without psychotic features: Secondary | ICD-10-CM | POA: Diagnosis present

## 2022-01-08 MED ORDER — BENZTROPINE MESYLATE 0.5 MG PO TABS
1.0000 mg | ORAL_TABLET | Freq: Every day | ORAL | Status: DC
  Administered 2022-01-08 – 2022-01-09 (×2): 1 mg via ORAL
  Filled 2022-01-08 (×2): qty 2

## 2022-01-08 NOTE — ED Notes (Signed)
Pt given toothbrush and paste and comb, and shasta.

## 2022-01-08 NOTE — ED Notes (Signed)
Pt changed into maroon scrubs. Belongings left at bedside as pt is able to contract for safety and family member remains at bedside. Pt updated regarding plan of care and states no further questions or needs at this time. Call light in reach.

## 2022-01-08 NOTE — ED Notes (Signed)
Mckenzye Cutright (son): would like to be called with any updates [phone]

## 2022-01-08 NOTE — Consult Note (Signed)
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 Son reports not being satisfied with care received in ED and being told patient could wait extensive amount of time for placement. Provider agreed to reassess patient for safety where is was determined patient is unable to be safely discharged.   Caitlyn [name] is a 76 y.o. female patient admitted with anxiety.  Patient reports, "My hand on my right was just constantly patting" as reason for admission. Patient states she has been feeling well and the only thing upsetting her is the hand tremor that is grossly affecting her ADLs. She endorses history of self harm "long time ago"   Per chart review, on admission patient's son reported that patient took 6 days worth of medication x4 days ago "to sleep" where he reported history of multiple psychiatric admissions and suicide attempts; told MD she had been talking about knives and expressed concerns of safety given that she lives alone. Patient minimizing son concerns and states she only mentioned knives to state there were none in the apartment to harm myself. She minimizes previous attempts and hospitalizations; son confirms history and intent. She endorses history of anxiety and depression, took Lexapro for 20 years, stopped approximately 3 years ago. Patient and husband moved to [place] from [place] in January due to decline in health and overall functioning; husband currently lives in  skilled area of community and patient lives alone in independent living apartment.   On assessment patient presents anxious and cooperative. Constant hand movement, patient appears restless; son notes onset since Abilify initiation. She reports increased frustration from the movements that affect her ADLs and overall functioning. MMSE attempted; unable to fully complete via telepsych. Oriented to person, time, place; situation (tremor). Able to spell world, complete serial 2's 7's (partial 7's, stumbled at 35), partial interpretation of proverb. 2/3 word recall. Able to follow 3 point commands. She minimizes psychiatric history; son interjects frequently to provide clear information regarding previous attempts, recent statements, and behaviors.   Collateral:  Aeja Garney (son) at bedside Son states mother requested to come in voluntary for the hand movements. Son states mother has been in and out of hospitals since the change from the Lexapro about 3 years ago due to possible tolerance. Most recently hospitalized at [facility] psychiatric hospital 30 days, offered ECT; family declined. Medications recently changed, Abilify added and Prozac decreased (unknown reason). States family is currently looking at [name] therapy in [place], getting second opinion; has appointment Monday for assessment.  Most recent suicide attempt was last year when took 20 Klonipin. States patient is sleeping 12 hours (more than normal), increasingly withdrawing from family/friends, not engaging in normal activities, and is currently 20 lbs lighter.  Provider acknowledged family concerns of patient "sitting for days while waiting for placement" and provided assurance that social work was actively working to find placement. Provider discussed extensively the safety concerns he (son) expressed on admission, patient's history, and risk factors as reasons for provider recommendations. When discussing discharge safety plan, son is unable to  ensure safe discharge plan where patient  could be monitored. Provider discussed safety concerns given patient's history and risk factors, further stating a safety plan would need to be in place before considering discharge and that until patient is safe for discharge further explained the need to adjust medications given concern for possible akathisia and ongoing depressive symptoms.Son verbalized an understanding   HPI:  Caitlyn [name] is a 76 year old female with past psychiatric history of anxiety, depression, and suicide attempts [facility] with her son for evaluation of anxiety and suicidal ideations. Patient presented to [facility] 12/26/21 for evaluation for [place] consult. Patient and husband are from [place], relocated to [place] recently by son due to decline in health and functioning. Currently lives in retirement community in apartment while husband lives in skilled area. Denies any history of substance use; multiple psychiatric hospital admissions related to depression and suicide attempts. UDS-, BAL<10.    Past Psychiatric History: anxiety, depression, suicide attempts  Risk to Self:   Risk to Others:   Prior Inpatient Therapy:   Prior Outpatient Therapy:    Past Medical History:  Past Medical History:  Diagnosis Date   Anxiety    Depression    Hypertension     Past Surgical History:  Procedure Laterality Date   EYE SURGERY     Family History: History reviewed. No pertinent family history. Family Psychiatric  History: not noted Social History:  Social History   Substance and Sexual Activity  Alcohol Use Never     Social History   Substance and Sexual Activity  Drug Use Never    Social History   Socioeconomic History   Marital status: Married    Spouse name: Not on file   Number of children: Not on file   Years of education: Not on file   Highest education level: Not on file  Occupational History   Not on file  Tobacco Use   Smoking status:  Never   Smokeless tobacco: Never  Vaping Use   Vaping Use: Never used  Substance and Sexual Activity   Alcohol use: Never   Drug use: Never   Sexual activity: Not on file  Other Topics Concern   Not on file  Social History Narrative   Not on file   Social Determinants of Health   Financial Resource Strain: Not on file  Food Insecurity: Not on file  Transportation Needs: Not on file  Physical Activity: Not on file  Stress: Not on file  Social Connections: Not on file   Additional Social History:    Allergies:   Allergies  Allergen Reactions   Penicillins     Labs:  Results for orders placed or performed during the hospital encounter of 01/07/22 (from the past 48 hour(s))  Resp Panel by RT-PCR (Flu A&B, Covid) Nasopharyngeal Swab     Status: None   Collection Time: 01/07/22  3:16 PM   Specimen: Nasopharyngeal Swab; Nasopharyngeal(NP) swabs in vial transport medium  Result Value Ref Range   SARS Coronavirus 2 by RT PCR NEGATIVE NEGATIVE    Comment: (NOTE) SARS-CoV-2 target nucleic acids are NOT DETECTED.  The SARS-CoV-2 RNA is generally detectable in upper respiratory specimens during the acute phase of infection. The lowest concentration of SARS-CoV-2 viral copies this assay can detect is 138 copies/mL. A negative result does not preclude SARS-Cov-2 infection and should not be used as the sole basis for treatment or other patient management decisions. A negative result may occur with  improper specimen collection/handling, submission of specimen other than  nasopharyngeal swab, presence of viral mutation(s) within the areas targeted by this assay, and inadequate number of viral copies(<138 copies/mL). A negative result must be combined with clinical observations, patient history, and epidemiological information. The expected result is Negative.  Fact Sheet for Patients:  EntrepreneurPulse.com.au  Fact Sheet for Healthcare Providers:   IncredibleEmployment.be  This test is no t yet approved or cleared by the [place] FDA and  has been authorized for detection and/or diagnosis of SARS-CoV-2 by FDA under an Emergency Use Authorization (EUA). This EUA will remain  in effect (meaning this test can be used) for the duration of the COVID-19 declaration under Section 564(b)(1) of the Act, 21 U.S.C.section 360bbb-3(b)(1), unless the authorization is terminated  or revoked sooner.       Influenza A by PCR NEGATIVE NEGATIVE   Influenza B by PCR NEGATIVE NEGATIVE    Comment: (NOTE) The Xpert Xpress SARS-CoV-2/FLU/RSV plus assay is intended as an aid in the diagnosis of influenza from Nasopharyngeal swab specimens and should not be used as a sole basis for treatment. Nasal washings and aspirates are unacceptable for Xpert Xpress SARS-CoV-2/FLU/RSV testing.  Fact Sheet for Patients: EntrepreneurPulse.com.au  Fact Sheet for Healthcare Providers: IncredibleEmployment.be  This test is not yet approved or cleared by the [place] FDA and has been authorized for detection and/or diagnosis of SARS-CoV-2 by FDA under an Emergency Use Authorization (EUA). This EUA will remain in effect (meaning this test can be used) for the duration of the COVID-19 declaration under Section 564(b)(1) of the Act, 21 U.S.C. section 360bbb-3(b)(1), unless the authorization is terminated or revoked.  Performed at [facility], [place] [address], [place], [place] [address]   Comprehensive metabolic panel     Status: Abnormal   Collection Time: 01/07/22  3:55 PM  Result Value Ref Range   Sodium 139 135 - 145 mmol/L   Potassium 3.9 3.5 - 5.1 mmol/L   Chloride 107 98 - 111 mmol/L   CO2 24 22 - 32 mmol/L   Glucose, Bld 103 (H) 70 - 99 mg/dL    Comment: Glucose reference range applies only to samples taken after fasting for at least 8 hours.   BUN 19 8 - 23 mg/dL    Creatinine, Ser 0.63 0.44 - 1.00 mg/dL   Calcium 9.4 8.9 - 10.3 mg/dL   Total Protein 7.1 6.5 - 8.1 g/dL   Albumin 4.1 3.5 - 5.0 g/dL   AST 20 15 - 41 U/L   ALT 16 0 - 44 U/L   Alkaline Phosphatase 59 38 - 126 U/L   Total Bilirubin 0.5 0.3 - 1.2 mg/dL   GFR, Estimated >60 >60 mL/min    Comment: (NOTE) Calculated using the CKD-EPI Creatinine Equation (2021)    Anion gap 8 5 - 15    Comment: Performed at [facility], [place] [address] [place], [place] [address]  Ethanol     Status: None   Collection Time: 01/07/22  3:55 PM  Result Value Ref Range   Alcohol, Ethyl (B) <10 <10 mg/dL    Comment: (NOTE) Lowest detectable limit for serum alcohol is 10 mg/dL.  For medical purposes only. Performed at [facility], [place] [address], [place], [place] [address]   CBC with Diff     Status: None   Collection Time: 01/07/22  3:55 PM  Result Value Ref Range   WBC 8.4 4.0 - 10.5 K/uL   RBC 4.60 3.87 - 5.11 MIL/uL   Hemoglobin  14.6 12.0 - 15.0 g/dL   HCT 42.4 36.0 - 46.0 %   MCV 92.2 80.0 - 100.0 fL   MCH 31.7 26.0 - 34.0 pg   MCHC 34.4 30.0 - 36.0 g/dL   RDW 12.8 11.5 - 15.5 %   Platelets 290 150 - 400 K/uL   nRBC 0.0 0.0 - 0.2 %   Neutrophils Relative % 64 %   Neutro Abs 5.5 1.7 - 7.7 K/uL   Lymphocytes Relative 25 %   Lymphs Abs 2.1 0.7 - 4.0 K/uL   Monocytes Relative 9 %   Monocytes Absolute 0.7 0.1 - 1.0 K/uL   Eosinophils Relative 1 %   Eosinophils Absolute 0.1 0.0 - 0.5 K/uL   Basophils Relative 1 %   Basophils Absolute 0.1 0.0 - 0.1 K/uL   Immature Granulocytes 0 %   Abs Immature Granulocytes 0.02 0.00 - 0.07 K/uL    Comment: Performed at [facility], [place] [address] [place], [place] [address]  Urine rapid drug screen (hosp performed)     Status: None   Collection Time: 01/07/22  7:52 PM  Result Value Ref Range   Opiates NONE DETECTED NONE DETECTED   Cocaine NONE DETECTED NONE DETECTED   Benzodiazepines NONE DETECTED  NONE DETECTED   Amphetamines NONE DETECTED NONE DETECTED   Tetrahydrocannabinol NONE DETECTED NONE DETECTED   Barbiturates NONE DETECTED NONE DETECTED    Comment: (NOTE) DRUG SCREEN FOR MEDICAL PURPOSES ONLY.  IF CONFIRMATION IS NEEDED FOR ANY PURPOSE, NOTIFY LAB WITHIN 5 DAYS.  LOWEST DETECTABLE LIMITS FOR URINE DRUG SCREEN Drug Class                     Cutoff (ng/mL) Amphetamine and metabolites    1000 Barbiturate and metabolites    200 Benzodiazepine                 [id number] Tricyclics and metabolites     300 Opiates and metabolites        300 Cocaine and metabolites        300 THC                            50 Performed at [facility], [place] [address], [place],  [address]   Urinalysis, Routine w reflex microscopic Urine, Clean Catch     Status: Abnormal   Collection Time: 01/07/22  7:52 PM  Result Value Ref Range   Color, Urine STRAW (A) YELLOW   APPearance CLEAR CLEAR   Specific Gravity, Urine 1.005 1.005 - 1.030   pH 5.0 5.0 - 8.0   Glucose, UA NEGATIVE NEGATIVE mg/dL   Hgb urine dipstick SMALL (A) NEGATIVE   Bilirubin Urine NEGATIVE NEGATIVE   Ketones, ur NEGATIVE NEGATIVE mg/dL   Protein, ur NEGATIVE NEGATIVE mg/dL   Nitrite NEGATIVE NEGATIVE   Leukocytes,Ua SMALL (A) NEGATIVE   RBC / HPF 0-5 0 - 5 RBC/hpf   WBC, UA 6-10 0 - 5 WBC/hpf   Bacteria, UA RARE (A) NONE SEEN   Squamous Epithelial / LPF 0-5 0 - 5   Mucus PRESENT     Comment: Performed at [facility], [place] [address] [place],  [address]    Medications:  Current Facility-Administered Medications  Medication Dose Route Frequency Provider Last Rate Last Admin   benztropine (COGENTIN) tablet 1 mg  1 mg Oral Daily Leevy-Johnson, Bodie Abernethy A, NP   1 mg at  01/09/22 0912   clonazePAM (KLONOPIN) tablet 1 mg  1 mg Oral BID Daleen Bo, MD   1 mg at 01/09/22 0912   cycloSPORINE (RESTASIS) 0.05 % ophthalmic emulsion 1 drop  1 drop Both Eyes Daily Daleen Bo, MD    1 drop at 01/09/22 0916   FLUoxetine (PROZAC) capsule 20 mg  20 mg Oral Violeta Gelinas, MD   20 mg at 01/08/22 0953   FLUoxetine (PROZAC) capsule 40 mg  40 mg Oral Violeta Gelinas, MD   40 mg at 01/09/22 0912   lisinopril (ZESTRIL) tablet 10 mg  10 mg Oral Daily Daleen Bo, MD   10 mg at 01/09/22 0912   LORazepam (ATIVAN) injection 0.5 mg  0.5 mg Intravenous PRN Daleen Bo, MD       mirtazapine (REMERON) tablet 15 mg  15 mg Oral QHS Daleen Bo, MD   15 mg at 01/08/22 2144   Current Outpatient Medications  Medication Sig Dispense Refill   ARIPiprazole (ABILIFY) 5 MG tablet Take 5 mg by mouth daily.     clonazePAM (KLONOPIN) 1 MG tablet Take 1 mg by mouth 2 (two) times daily.     cycloSPORINE (RESTASIS) 0.05 % ophthalmic emulsion Place 1 drop into both eyes daily.     FLUoxetine (PROZAC) 20 MG capsule Take 20-40 mg by mouth See admin instructions. Takes 20 mg and 40 mg on alternate days.     lisinopril (ZESTRIL) 10 MG tablet Take 10 mg by mouth daily.     loperamide (IMODIUM A-D) 2 MG tablet 1 tablet as needed     mirtazapine (REMERON) 15 MG tablet Take 15 mg by mouth at bedtime.      Musculoskeletal: Strength & Muscle Tone: increased Gait & Station:  akathisia noted Patient leans: Front  Psychiatric Specialty Exam:  Presentation  General Appearance: Casual  Eye Contact:Fair  Speech:Clear and Coherent  Speech Volume:Normal  Handedness:No data recorded  Mood and Affect  Mood:Anxious  Affect:Constricted   Thought Process  Thought Processes:Other (comment) (nonlinear)  Descriptions of Associations:Circumstantial  Orientation:Full (Time, Place and Person)  Thought Content:Scattered; Tangential  History of Schizophrenia/Schizoaffective disorder:No data recorded Duration of Psychotic Symptoms:No data recorded Hallucinations:Hallucinations: None  Ideas of Reference:None  Suicidal Thoughts:Suicidal Thoughts: Yes, Passive (pt denies intent,  plan)  Homicidal Thoughts:Homicidal Thoughts: No   Sensorium  Memory:Immediate Fair; Recent Fair; Remote Fair  Judgment:Other (comment) (inconsistent)  Insight:Shallow; Lacking   Executive Functions  Concentration:Fair  Attention Span:Fair  [place] of Knowledge:Good  Language:Fair   Psychomotor Activity  Psychomotor Activity:Psychomotor Activity: Increased; Extrapyramidal Side Effects (EPS); Restlessness (r/o akathisia) Extrapyramidal Side Effects (EPS): Akathisia AIMS Completed?: No (telepsych)   Assets  Assets:Communication Skills; Housing; Resilience; Social Support; Intimacy; Financial Resources/Insurance; Vocational/Educational   Sleep  Sleep:Sleep: Fair    Physical Exam: Physical Exam HENT:     Head: Normocephalic.     Nose: Nose normal.     Mouth/Throat:     Pharynx: Oropharynx is clear.  Eyes:     Pupils: Pupils are equal, round, and reactive to light.  Cardiovascular:     Rate and Rhythm: Normal rate.     Pulses: Normal pulses.  Pulmonary:     Effort: Pulmonary effort is normal.  Abdominal:     General: Abdomen is flat.  Musculoskeletal:     Cervical back: Normal range of motion.     Comments: Constant movement; r/o akathisia  Skin:    General: Skin is warm and dry.  Neurological:  Mental Status: She is alert and oriented to person, place, and time.  Psychiatric:        Attention and Perception: She does not perceive auditory or visual hallucinations.        Mood and Affect: Mood is anxious.        Speech: Speech is tangential.        Behavior: Behavior is cooperative.        Cognition and Memory: Cognition is impaired.        Judgment: Judgment is impulsive and inappropriate.   Review of Systems  Constitutional:  Positive for weight loss.  Neurological:  Positive for tremors.  Psychiatric/Behavioral:  Positive for depression and suicidal ideas. The patient is nervous/anxious.   Blood pressure (!) 122/59, pulse (!) 47,  temperature 97.7 F (36.5 C), resp. rate 17, height 5\' 2"  (1.575 m), weight 47.6 kg, SpO2 99 %. Body mass index is 19.2 kg/m.  Treatment Plan Summary: Daily contact with patient to assess and evaluate symptoms and progress in treatment, Medication management, and Plan discontinue Abilify 5mg  due to concerns of akathisia, start benztropine 1 mg for EPS. Continue to seek inpatient hospitalization for further observation, stabilization, treatment.   Disposition: Recommend psychiatric Inpatient admission when medically cleared. Supportive therapy provided about ongoing stressors. Discussed crisis plan, support from social network, calling 911, coming to the Emergency Department, and calling Suicide Hotline.  This service was provided via telemedicine using a 2-way, interactive audio and video technology.  Names of all persons participating in this telemedicine service and their role in this encounter. Name: Oneida Alar Role: PMHNP  Name: Hampton Abbot Role: Attending MD  Name: [name] Role: patient  Name: Misty Stanley Role: son    Inda Merlin, NP 01/09/2022 10:36 AM

## 2022-01-08 NOTE — ED Notes (Signed)
Pt's son took pt belongings home.

## 2022-01-08 NOTE — ED Notes (Signed)
TTS assessment being completed at this time. ?

## 2022-01-08 NOTE — ED Notes (Signed)
TTS complete. Pt states no further needs at this time.

## 2022-01-08 NOTE — BH Assessment (Signed)
Comprehensive Clinical Assessment (CCA) Note  01/08/2022 Caitlyn [name] [id number]  Disposition: Caitlyn Reichert, NP, patient meets inpatient criteria. Geropsychiatry recommended. Disposition SW will secure placement in the AM.   The patient demonstrates the following risk factors for suicide: Chronic risk factors for suicide include: psychiatric disorder of depression and previous suicide attempts fall/2022 attempted overdose . Acute risk factors for suicide include: social withdrawal/isolation. Protective factors for this patient include: positive social support, responsibility to others (children, family), coping skills, and hope for the future. Considering these factors, the overall suicide risk at this point appears to be high. Patient is not appropriate for outpatient follow up.  [place] ED from 01/07/2022 in [place] No Risk      Caitlyn [name] is a 76 year old female presenting voluntary to [facility] due to depression and taking 6 days worth of anti-depression medications 4 days ago. Patient is accompanied by son, Caitlyn [name]. Patient gave consent for patient to be present. Patient denied the overdose was intentional, stating "I just wanted to sleep, it was not a smart thing to do". Patient denied SI, HI, psychosis and alcohol and drug usage. Patient also reports right hand tremors is the reason why she is in the ED. Patient reported psych medications from PCP are not working. Patient reported worsening depressive symptoms for the past 2-3 months since she moved from [place]. Patient reported suicide attempt of overdose on medication in Fall/2023. Multiple psych hospitalizations reported. Patient resides in [place], patients husband resides in the rehab in same community and even though they don't get to see each other, they talk on the phone 4-5x a day. Patient contracts for safety.  Caitlyn [name], son, reports patient is  not acting like herself and that she has been severely depressed for 2 years. Caitlyn [name] states that patient is not thriving, she isolates herself and has decreased in weight from 120 lbs to 103 lbs. Caitlyn [name] states that patient has stated she can't keep going on like this. Caitlyn [name] reports her safety is a big concern and that she needs inpatient treatment as she is unable to keep herself safe.   Chief Complaint:  Chief Complaint  Patient presents with   hand tremor   Anxiety   Depression   Visit Diagnosis:  Major depressive disorder  CCA Screening, Triage and Referral (STR)  Patient Reported Information How did you hear about [place]? Family/Friend  What Is the Reason for Your Visit/Call Today? Depression and right hand tremors  How Long Has This Been Causing You Problems? 1 wk - 1 month  What Do You Feel Would Help You the Most Today? Treatment for Depression or other mood problem; Stress Management   Have You Recently Had Any Thoughts About Hurting Yourself? No  Are You Planning to Commit Suicide/Harm Yourself At This time? No   Have you Recently Had Thoughts About [place]? No  Are You Planning to Harm Someone at This Time? No  Explanation: No data recorded  Have You Used Any Alcohol or Drugs in the Past 24 Hours? No  How Long Ago Did You Use Drugs or Alcohol? No data recorded What Did You Use and How Much? No data recorded  Do You Currently Have a Therapist/Psychiatrist? No  Name of Therapist/Psychiatrist: No data recorded  Have You Been Recently Discharged From Any Office Practice or Programs? No  Explanation of Discharge From Practice/Program: No data recorded    CCA Screening Triage Referral Assessment  Type of Contact: Tele-Assessment  Telemedicine Service Delivery:   Is this Initial or Reassessment? Initial Assessment  Date Telepsych consult ordered in [facility]:  01/07/22  Time Telepsych consult ordered in [facility]:  1650  Location of Assessment: [facility]  Provider  Location: [facility] Assessment Services   Collateral Involvement: Caitlyn [name], son   Does Patient Have a Stage manager Guardian? No data recorded Name and Contact of Legal Guardian: No data recorded If Minor and Not Living with Parent(s), Who has Custody? No data recorded Is CPS involved or ever been involved? Never  Is APS involved or ever been involved? Never   Patient Determined To Be At Risk for Harm To Self or Others Based on Review of Patient Reported Information or Presenting Complaint? No data recorded Method: No data recorded Availability of Means: No data recorded Intent: No data recorded Notification Required: No data recorded Additional Information for Danger to Others Potential: No data recorded Additional Comments for Danger to Others Potential: No data recorded Are There Guns or Other Weapons in Your Home? No data recorded Types of Guns/Weapons: No data recorded Are These Weapons Safely Secured?                            No data recorded Who Could Verify You Are Able To Have These Secured: No data recorded Do You Have any Outstanding Charges, Pending Court Dates, Parole/Probation? No data recorded Contacted To Inform of Risk of Harm To Self or Others: No data recorded   Does Patient Present under Involuntary Commitment? No  IVC Papers Initial File Date: No data recorded  [place] of Residence: [place]   Patient Currently Receiving the Following Services: Not Receiving Services   Determination of Need: Emergent (2 hours)   Options For Referral: Outpatient Therapy; Medication Management; Inpatient Hospitalization     CCA Biopsychosocial Patient Reported Schizophrenia/Schizoaffective Diagnosis in Past: No data recorded  Strengths: self-awareness   Mental Health Symptoms Depression:   Hopelessness; Fatigue; Difficulty Concentrating; Change in energy/activity; Increase/decrease in appetite; Sleep (too much or little); Tearfulness   Duration of  Depressive symptoms:  Duration of Depressive Symptoms: Less than two weeks   Mania:   None   Anxiety:    None; Worrying; Sleep; Restlessness; Fatigue   Psychosis:   None   Duration of Psychotic symptoms:    Trauma:   None   Obsessions:   None   Compulsions:   None   Inattention:   None   Hyperactivity/Impulsivity:   None   Oppositional/Defiant Behaviors:   None   Emotional Irregularity:   None   Other Mood/Personality Symptoms:  No data recorded   Mental Status Exam Appearance and self-care  Stature:   Average   Weight:   Thin   Clothing:   Age-appropriate   Grooming:   Normal   Cosmetic use:   None   Posture/gait:   Other (Comment) (laying down on bed)   Motor activity:   Not Remarkable   Sensorium  Attention:   Normal   Concentration:   Normal   Orientation:   Time; Situation; Place; Person   Recall/memory:   Defective in Immediate   Affect and Mood  Affect:   Depressed; Flat   Mood:   Depressed   Relating  Eye contact:   Normal   Facial expression:   Depressed; Sad   Attitude toward examiner:   Cooperative   Thought and Language  Speech flow:  Slow   Thought content:   Appropriate to Mood and Circumstances   Preoccupation:   None   Hallucinations:   None   Organization:  No data recorded  [employer] of Knowledge:   Average   Intelligence:   Average   Abstraction:   Functional   Judgement:   Dangerous   Reality Testing:   Adequate   Insight:   Lacking   Decision Making:   Impulsive   Social Functioning  Social Maturity:  No data recorded  Social Judgement:   Naive   Stress  Stressors:   Illness   Coping Ability:   Programme researcher, broadcasting/film/video Deficits:   Self-care; Self-control; Decision making   Supports:   Family     Religion: Religion/Spirituality Are 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)  Leisure/Recreation: Leisure / Recreation Do You Have Hobbies?:  Yes Leisure and Hobbies: chair yoga  Exercise/Diet: Exercise/Diet Do You Exercise?: No Have You Gained or Lost A Significant Amount of Weight in the Past Six Months?: Yes-Lost Number of Pounds Lost?:  (uta) Do You Follow a Special Diet?: No Do You Have Any Trouble Sleeping?: No   CCA Employment/Education Employment/Work Situation: Employment / Work Nurse, 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 Has Patient ever Been in 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)  Education: Education Is Patient Currently Attending School?: No Last Grade Completed: 12 Did You Nutritional therapist?:  (uta) Did You Have An Individualized Education Program (IIEP):  [place] [place]) Did You Have Any Difficulty At School?:  [employer]) Patient's Education Has Been Impacted by Current Illness:  (uta)   CCA Family/Childhood History Family and Relationship History: Family history Marital status: Married Number of Years Married:  Special educational needs teacher) What types of issues is patient dealing with in the relationship?: getting old Does patient have children?: Yes How many children?: 2 How is patient's relationship with their children?: good  Childhood History:  Childhood History Did patient suffer any verbal/emotional/physical/sexual abuse as a child?: No Did patient suffer from severe childhood neglect?: No Has patient ever been sexually abused/assaulted/raped as an adolescent or adult?: No Was the patient ever a victim of a crime or a disaster?: No Witnessed domestic violence?: No  Child/Adolescent Assessment:     CCA Substance Use Alcohol/Drug Use: Alcohol / Drug Use Pain Medications: see MAR Prescriptions: see MAR Over the Counter: see MAR History of alcohol / drug use?: No history of alcohol / drug abuse                         ASAM's:  Six Dimensions of Multidimensional Assessment  Dimension 1:  Acute Intoxication and/or Withdrawal Potential:      Dimension 2:  Biomedical Conditions and Complications:      Dimension 3:  Emotional,  Behavioral, or Cognitive Conditions and Complications:     Dimension 4:  Readiness to Change:     Dimension 5:  Relapse, Continued use, or Continued Problem Potential:     Dimension 6:  Recovery/Living Environment:     ASAM Severity Score:    ASAM Recommended Level of Treatment:     Substance use Disorder (SUD)    Recommendations for Services/Supports/Treatments: Recommendations for Services/Supports/Treatments Recommendations For Services/Supports/Treatments: Individual Therapy, Medication Management, Inpatient Hospitalization  Discharge Disposition:    DSM5 Diagnoses: There are no problems to display for this patient.    Referrals to Alternative Service(s): Referred to Alternative Service(s):   Place:   Date:   Time:    Referred to Alternative Service(s):  Place:   Date:   Time:    Referred to Alternative Service(s):   Place:   Date:   Time:    Referred to Alternative Service(s):   Place:   Date:   Time:     Venora Maples, [facility]

## 2022-01-08 NOTE — ED Provider Notes (Addendum)
  Physical Exam  BP (!) 88/64   Pulse (!) 55   Temp 98.3 F (36.8 C) (Oral)   Resp 18   Ht 5\' 2"  (1.575 m)   Wt 47.6 kg   SpO2 97%   BMI 19.20 kg/m   Physical Exam  Procedures  Procedures  ED Course / MDM    Medical Decision Making Amount and/or Complexity of Data Reviewed Labs: ordered. Radiology: ordered.  Risk Prescription drug management.   Discussed with patient and son.  She is eager to go somewhere else.  He is wondering about the placement.  States she has already been here 20 hours.  He was informed that it could be eventually even days while they are attempting to get the placement.  Reviewing the notes from psychiatry they state that she has not a candidate for outpatient management this time and is high enough risk to need inpatient treatment.  Reviewed lab work is reassuring.  Has not had her breakfast yet because the tray has not come up yet.       Davonna Belling, MD 01/08/22 [phone]  Patient is somewhat eager to leave.  Has been reevaluated by psychiatry but waiting on their new recommendations.  Has been faxed out for placement but no placement yet.    Davonna Belling, MD 01/08/22 1452

## 2022-01-08 NOTE — BH Assessment (Signed)
Received a call from Charna Busman, staff at [facility]. States that patient has been accepted to [facility], may arrive anytime 01/09/2022 after 8am. The accepting provider is Dr. Kevan Ny. Patient will go to the 700 hall upon arrival.  Call report to the Geriatric unit at #[phone].

## 2022-01-08 NOTE — BH Assessment (Signed)
Per Maxie Barb, NP, patient meets inpatient criteria. Geropsychiatry recommended. Disposition  counselor faxed patient's referrals to the following hospitals.     [facility]   N/A [address] [place] [address] [phone] --  [facility]   N/A [address] [place], [place] [address] [phone] --  [facility]   N/A [address] [place] [address] [phone] --  [facility]   N/A [address], [place] [address] [phone] --  [facility]   N/A [address] [place], [place] [address] [phone] --  [facility]   N/A [address] [place] [address] [phone] --  [facility]   N/A [phone] [address] [place] [address] [phone] --  [facility]   N/A [address] [place], [place] [address] [phone] --  [facility]   N/A [address], [place] [address] [phone] --  [facility]   N/A [address] [place] [address] [phone] --  [facility]   N/A [address], [place] [address] [phone] --  [facility]   N/A [address] [place] [address] [phone] --  [facility]   N/A [address], [place] [address] [phone] --  [facility]   N/A [address], [place] [address] [phone] --  [facility]   N/A [address], [place] [address] [phone] --  [facility]   N/A [address], [place] [address] [phone] --  [facility]   N/A [address]  [place] [address] [phone] --  [facility]   N/A [address] [place] [address] [phone] --  [facility]   N/A [address], [place] [address] [phone] --  [facility]   N/A [address] [place] [address] [phone] --  [facility]   N/A [address], [place] [address] [phone] --  [facility]   N/A [address] [place] [address] [phone] --  [facility]   N/A [address], [place] [address] [phone] --  [facility]   N/A [address] [place] [address] [phone] --  [facility]   N/A [address] [place] [address] [phone] --  [facility]   N/A [address], [place] [address] [phone] --  [facility]   N/A [address] [place] [address] [phone] --  [facility]   N/A [address] [place] [address] [phone] --  [facility]   N/A [address], [place] [address] [phone] --

## 2022-01-09 NOTE — ED Provider Notes (Addendum)
Emergency Medicine Observation Re-evaluation Note  Caitlyn [name] is a 76 y.o. female, seen on rounds today.  Pt initially presented to the ED for complaints of hand tremor, Anxiety, and Depression Currently, the patient is resting.  Physical Exam  BP (!) 122/59   Pulse (!) 47   Temp 97.7 F (36.5 C)   Resp 17   Ht 5\' 2"  (1.575 m)   Wt 47.6 kg   SpO2 99%   BMI 19.20 kg/m  Physical Exam  General: calm, no distress Cardiac: warm well perfused Lungs: even unlabored Psych: calm  ED Course / MDM  EKG:EKG Interpretation  Date/Time:  Friday Jan 07 2022 15:15:43 EDT Ventricular Rate:  71 PR Interval:  112 QRS Duration: 100 QT Interval:  438 QTC Calculation: 419 R Axis:   70 Text Interpretation: Sinus rhythm Multiform ventricular premature complexes Borderline short PR interval Anteroseptal infarct, age indeterminate No previous ECGs available Confirmed by [email or web address] [phone]) on 01/07/2022 4:07:08 PM  I have reviewed the labs performed to date as well as medications administered while in observation.  Recent changes in the last 24 hours include accepted to 01/09/2022.  Plan  Current plan is for [facility]  Caitlyn [name] is not under involuntary commitment.     [name], MD 01/09/22 [phone]  Addendum -I discussed the case with the psych NP further request morning, discussed the case with [facility] admission RN.  Our psych NP has discussed with [facility].  They are planning to reassess patient this morning.  They are now considering discharge with family versus inpatient geropsych placement.    Lucianne Muss, MD 01/09/22 0932   ADDENDUM - received further update from 01/11/22 NP - family and patient are agreeable to transfer to [facility]. Remain voluntary and cooperative. Staff calling facility to provide update and working on transport.     [facility], MD 01/09/22 1114

## 2022-01-09 NOTE — ED Notes (Signed)
Report given to [facility] [employer]. Safe transport contacted. This Probation officer left a voicemail for safe transport to contact due to the phone call going to voicemail.

## 2022-01-09 NOTE — Consult Note (Signed)
  Caitlyn [name] is a 76 year old female with past psychiatric history of anxiety, depression, and suicide attempts presented to [facility] voluntarily with her son for evaluation of anxiety and suicidal ideations. Patient presented to [facility] 12/26/21 for evaluation for [place] consult. Patient and husband are from [place], relocated to [place] recently by son due to decline in health and functioning. Currently lives in retirement community in apartment while husband lives in skilled area. Denies any history of substance use; multiple psychiatric hospital admissions related to depression and suicide attempts. UDS-, BAL<10.   Provider contacted as son was initially declining placement for patient at [facility]. Provider spoke with son at length about disposition. Son initially stated plan to take patient to [facility] in the morning for admission and to begin ECT therapy. Provider consulted with Attending MD Dwyane Dee regarding case; patient placed on continuous observation for reassessment in morning.   Provider notified daughter in law was at bedside wanting to speak with provider. Provider spoke to both patient and daughter in law at length where is has been determined the patient has now agreed to continue with [facility] placement. RN to call report to begin transfer process.

## 2022-01-10 DIAGNOSIS — R6889 Other general symptoms and signs: Secondary | ICD-10-CM | POA: Diagnosis not present

## 2022-01-10 DIAGNOSIS — Z119 Encounter for screening for infectious and parasitic diseases, unspecified: Secondary | ICD-10-CM | POA: Diagnosis not present

## 2022-01-10 DIAGNOSIS — N390 Urinary tract infection, site not specified: Secondary | ICD-10-CM | POA: Diagnosis not present

## 2022-01-11 DIAGNOSIS — I10 Essential (primary) hypertension: Secondary | ICD-10-CM | POA: Diagnosis not present

## 2022-01-12 DIAGNOSIS — I10 Essential (primary) hypertension: Secondary | ICD-10-CM | POA: Diagnosis not present

## 2022-01-13 DIAGNOSIS — I10 Essential (primary) hypertension: Secondary | ICD-10-CM | POA: Diagnosis not present

## 2022-01-14 DIAGNOSIS — I10 Essential (primary) hypertension: Secondary | ICD-10-CM | POA: Diagnosis not present

## 2022-01-15 DIAGNOSIS — I10 Essential (primary) hypertension: Secondary | ICD-10-CM | POA: Diagnosis not present

## 2022-01-16 DIAGNOSIS — I10 Essential (primary) hypertension: Secondary | ICD-10-CM | POA: Diagnosis not present

## 2022-01-16 DIAGNOSIS — N390 Urinary tract infection, site not specified: Secondary | ICD-10-CM | POA: Diagnosis not present

## 2022-01-17 DIAGNOSIS — Z0489 Encounter for examination and observation for other specified reasons: Secondary | ICD-10-CM | POA: Diagnosis not present

## 2022-01-17 DIAGNOSIS — N390 Urinary tract infection, site not specified: Secondary | ICD-10-CM | POA: Diagnosis not present

## 2022-01-17 DIAGNOSIS — I10 Essential (primary) hypertension: Secondary | ICD-10-CM | POA: Diagnosis not present

## 2022-01-18 DIAGNOSIS — I10 Essential (primary) hypertension: Secondary | ICD-10-CM | POA: Diagnosis not present

## 2022-01-18 DIAGNOSIS — R0981 Nasal congestion: Secondary | ICD-10-CM | POA: Diagnosis not present

## 2022-01-18 DIAGNOSIS — Z0489 Encounter for examination and observation for other specified reasons: Secondary | ICD-10-CM | POA: Diagnosis not present

## 2022-01-18 DIAGNOSIS — N390 Urinary tract infection, site not specified: Secondary | ICD-10-CM | POA: Diagnosis not present

## 2022-01-19 DIAGNOSIS — Z0489 Encounter for examination and observation for other specified reasons: Secondary | ICD-10-CM | POA: Diagnosis not present

## 2022-01-19 DIAGNOSIS — I10 Essential (primary) hypertension: Secondary | ICD-10-CM | POA: Diagnosis not present

## 2022-02-16 ENCOUNTER — Encounter

## 2022-02-16 NOTE — Progress Notes (Signed)
Labs for patient's SAWV. Left message for patient.

## 2022-02-23 ENCOUNTER — Ambulatory Visit: Admit: 2022-02-23 | Discharge: 2022-02-23 | Payer: MEDICARE | Attending: Internal Medicine | Primary: Internal Medicine

## 2022-02-23 ENCOUNTER — Encounter

## 2022-02-23 DIAGNOSIS — I10 Essential (primary) hypertension: Secondary | ICD-10-CM | POA: Diagnosis not present

## 2022-02-23 DIAGNOSIS — G2581 Restless legs syndrome: Secondary | ICD-10-CM

## 2022-02-23 DIAGNOSIS — Z0000 Encounter for general adult medical examination without abnormal findings: Secondary | ICD-10-CM

## 2022-02-23 LAB — LIPID PANEL
Chol/HDL Ratio: 2.2 (ref 0.0–4.4)
Cholesterol: 195 mg/dL (ref 100–200)
HDL: 88 mg/dL (ref >=50)
LDL Cholesterol: 93.8 mg/dL (ref 0.0–100.0)
LDL/HDL Ratio: 1.1
Triglycerides: 66 mg/dL (ref 0–149)
VLDL: 13.2 mg/dL (ref 5.0–40.0)

## 2022-02-23 LAB — CBC WITH AUTO DIFFERENTIAL
Absolute Baso #: 0 10*3/uL (ref 0.0–0.2)
Absolute Eos #: 0.1 10*3/uL (ref 0.0–0.5)
Absolute Lymph #: 2.1 10*3/uL (ref 1.0–3.2)
Absolute Mono #: 0.6 10*3/uL (ref 0.3–1.0)
Basophils %: 0.6 % (ref 0.0–2.0)
Eosinophils %: 1.1 % (ref 0.0–7.0)
Hematocrit: 36.3 % (ref 34.0–47.0)
Hemoglobin: 11.7 g/dL (ref 11.5–15.7)
Immature Grans (Abs): 0.01 10*3/uL (ref 0.00–0.06)
Immature Granulocytes: 0.2 % (ref 0.0–0.6)
Lymphocytes: 39.2 % (ref 15.0–45.0)
MCH: 29.9 pg (ref 27.0–34.5)
MCHC: 32.2 g/dL (ref 32.0–36.0)
MCV: 92.8 fL (ref 81.0–99.0)
MPV: 9.6 fL (ref 7.2–13.2)
Monocytes: 10.6 % (ref 4.0–12.0)
NRBC Absolute: 0 10*3/uL (ref 0.000–0.012)
NRBC Automated: 0 % (ref 0.0–0.2)
Neutrophils %: 48.3 % (ref 42.0–74.0)
Neutrophils Absolute: 2.6 10*3/uL (ref 1.6–7.3)
Platelets: 289 10*3/uL (ref 140–440)
RBC: 3.91 x10e6/mcL (ref 3.60–5.20)
RDW: 14.5 % (ref 11.0–16.0)
WBC: 5.3 10*3/uL (ref 3.8–10.6)

## 2022-02-23 LAB — COMPREHENSIVE METABOLIC PANEL
ALT: 36 U/L — ABNORMAL HIGH (ref 0–35)
AST: 36 U/L — ABNORMAL HIGH (ref 0–35)
Albumin/Globulin Ratio: 2.6 (ref 1.00–2.70)
Albumin: 4.9 g/dL (ref 3.5–5.2)
Alk Phosphatase: 75 U/L (ref 35–117)
Anion Gap: 12 mmol/L (ref 2–17)
BUN: 19 mg/dL (ref 8–23)
CO2: 26 mmol/L (ref 22–29)
Calcium: 9.7 mg/dL (ref 8.8–10.2)
Chloride: 103 mmol/L (ref 98–107)
Creatinine: 0.8 mg/dL (ref 0.5–1.0)
Est, Glom Filt Rate: 76 mL/min/1.73m (ref >=60)
Globulin: 1.9 g/dL (ref 1.9–4.4)
Glucose: 97 mg/dL (ref 70–99)
OSMOLALITY CALCULATED: 283 mOsm/kg (ref 270–287)
Potassium: 4.6 mmol/L (ref 3.5–5.3)
Sodium: 141 mmol/L (ref 135–145)
Total Bilirubin: 0.42 mg/dL (ref 0.00–1.20)
Total Protein: 6.8 g/dL (ref 6.4–8.3)

## 2022-02-23 LAB — IRON AND TIBC
Iron Saturation: 32 % (ref 20–40)
Iron: 106 ug/dL (ref 37–145)
TIBC: 335 ug/dL (ref 250–450)
UIBC: 229 ug/dL (ref 112.0–347.0)

## 2022-02-23 LAB — T4, FREE: T4 Free: 0.93 ng/dL (ref 0.82–1.70)

## 2022-02-23 LAB — TSH WITH REFLEX: TSH: 12.4 mcIU/mL — ABNORMAL HIGH (ref 0.358–3.740)

## 2022-02-23 MED ORDER — PRAMIPEXOLE DIHYDROCHLORIDE 1 MG PO TABS
1 MG | ORAL_TABLET | Freq: Every evening | ORAL | 3 refills | Status: DC
Start: 2022-02-23 — End: 2022-03-04

## 2022-02-23 NOTE — Progress Notes (Signed)
CHIEF COMPLAINT:  Chief Complaint   Patient presents with    Medicare AWV     Breast exam as well as sleep issue pt has been up and down with her sleeping this has been going on for months now pt is taking Mag,trazadon and melatonin .        HISTORY OF PRESENT ILLNESS:  Diana [name] is a 76 y.o. female  who presents reason: for SAWV and follow up osteoporosis, depression  and anxiety and RLS.  Having trouble sleeping due to RLS  Osteoporosis - finished Evenity and now on Reclast  PHQ:  PHQ-9  02/23/2022   Little interest or pleasure in doing things 1   Feeling down, depressed, or hopeless 1   PHQ-2 Score 2   PHQ-9 Total Score 2       Past Medical History:   Diagnosis Date    Anemia     Arthritis     Bleeding ulcer     DDD (degenerative disc disease), lumbar     History of blood transfusion     Hypertension     Hypothyroidism     Osteoporosis     Scoliosis       Past Surgical History:   Procedure Laterality Date    JOINT REPLACEMENT Left 08/2014    KNEE ARTHROSCOPY Left 2010    NOSE SURGERY      RESET AFTER BEING BROKEN    TOTAL HIP ARTHROPLASTY  08/23/2015    TOTAL KNEE ARTHROPLASTY Right 04/2018    TUBAL LIGATION        Social History     Socioeconomic History    Marital status: Married     Spouse name: None    Number of children: None    Years of education: None    Highest education level: None   Tobacco Use    Smoking status: Never    Smokeless tobacco: Never   Vaping Use    Vaping Use: Never used   Substance and Sexual Activity    Alcohol use: Yes    Drug use: Never    Sexual activity: Not Currently     Partners: Male     Social Determinants of Health     Physical Activity: Insufficiently Active    Days of Exercise per Week: 1 day    Minutes of Exercise per Session: 30 min       Current Outpatient Medications   Medication Instructions    acetaminophen (TYLENOL) 650 MG extended release tablet No dose, route, or frequency recorded.    Bacillus Coagulans-Inulin (PROBIOTIC FORMULA) 1-250 BILLION-MG CAPS No dose, route,  or frequency recorded.    magnesium oxide (MAG-OX) 500 mg    omeprazole (PRILOSEC) 40 MG delayed release capsule 1 capsule    pramipexole (MIRAPEX) 1.5 mg, Oral, EVERY EVENING, 1.5 TABLETS AT BEDTIME ORALLY 90 DAYS    RA Vitamin E-Vit A & D 20000 units CREA 1 tablet Orally Twice a day    traZODone (DESYREL) 100 MG tablet TAKE 2 TABLETS BY MOUTH EVERY DAY AT BEDTIME    vitamin D-3 (CHOLECALCIFEROL) 125 MCG (5000 UT) TABS 5,000 Int'l Units    zoster recombinant adjuvanted vaccine (SHINGRIX) 50 MCG/0.5ML SUSR injection No dose, route, or frequency recorded.             Review of Systems   Constitutional:  Negative for chills and fever.   HENT:  Negative for congestion.    Respiratory:  Negative for cough and shortness of breath.  Cardiovascular:  Negative for chest pain and palpitations.   Gastrointestinal:  Negative for abdominal pain, constipation, diarrhea, nausea and vomiting.   Genitourinary:  Negative for frequency.   Musculoskeletal:  Negative for arthralgias and myalgias.   Neurological:  Negative for weakness.   Psychiatric/Behavioral:  Positive for sleep disturbance. Negative for confusion and dysphoric mood.       BP 110/62   Pulse 55   Temp 97.9 F (36.6 C) (Oral)   Ht 5\' 5"  (1.651 m)   Wt 123 lb (55.8 kg)   SpO2 94%   BMI 20.47 kg/m     Objective   Physical Exam  Constitutional:       Appearance: Normal appearance.   HENT:      Head: Normocephalic and atraumatic.   Cardiovascular:      Rate and Rhythm: Normal rate and regular rhythm.      Heart sounds: Normal heart sounds.   Pulmonary:      Effort: Pulmonary effort is normal.      Breath sounds: Normal breath sounds. No wheezing.   Abdominal:      Palpations: Abdomen is soft.      Tenderness: There is no abdominal tenderness.   Musculoskeletal:         General: Normal range of motion.      Cervical back: Normal range of motion and neck supple.   Skin:     General: Skin is warm and dry.   Neurological:      General: No focal deficit present.       Mental Status: She is alert.   Psychiatric:         Mood and Affect: Mood normal.         Behavior: Behavior normal.         Thought Content: Thought content normal.         Judgment: Judgment normal.           Lab Results   Component Value Date    WBC 5.3 02/23/2022    HGB 11.7 02/23/2022    HCT 36.3 02/23/2022    MCV 92.8 02/23/2022    PLT 289 02/23/2022     Lab Results   Component Value Date/Time    NA 141 02/23/2022 10:24 AM    K 4.6 02/23/2022 10:24 AM    CL 103 02/23/2022 10:24 AM    CO2 26 02/23/2022 10:24 AM    BUN 19 02/23/2022 10:24 AM    CREATININE 0.8 02/23/2022 10:24 AM    GLUCOSE 97 02/23/2022 10:24 AM    CALCIUM 9.7 02/23/2022 10:24 AM      Lab Results   Component Value Date    CHOL 195 02/23/2022    CHOL 182 01/18/2019     Lab Results   Component Value Date    TRIG 66 02/23/2022    TRIG 89 01/18/2019     Lab Results   Component Value Date    HDL 88 02/23/2022    HDL 67 01/18/2019     Lab Results   Component Value Date    LDLCHOLESTEROL 93.8 02/23/2022    LDLCHOLESTEROL 97.2 01/18/2019     Lab Results   Component Value Date    VLDL 13.2 02/23/2022    VLDL 17.8 01/18/2019     Lab Results   Component Value Date    CHOLHDLRATIO 2.2 02/23/2022    CHOLHDLRATIO 2.7 01/18/2019     No results found for: PSA, PSADIA  No results found  for: TSHFT4, TSH    IMPRESSION/PLAN  1. Medicare annual wellness visit, subsequent  2. Restless legs syndrome  Assessment & Plan:  Chronic, not controlled.  Increase dose of Mirapex and monitor for efficacy and side effects.  Check iron levels and correct if low  3. Restless leg syndrome  Assessment & Plan:  Chronic, not controlled.  Increase dose of Mirapex and monitor for efficacy and side effects.  Check iron levels and correct if low  Orders:  -     pramipexole (MIRAPEX) 1 MG tablet; Take 1.5 tablets by mouth every evening 1.5 TABLETS AT BEDTIME ORALLY 90 DAYS, Disp-135 tablet, R-3Normal  -     CBC with Auto Differential; Future  -     Comprehensive Metabolic Panel; Future  -      Iron and TIBC; Future  4. Primary insomnia  -     Comprehensive Metabolic Panel; Future  5. Acquired hypothyroidism  Assessment & Plan:  Chronic problem, controlled, continue current treatment   Orders:  -     Comprehensive Metabolic Panel; Future  -     TSH with Reflex; Future  6. Dyslipidemia  -     Lipid Panel; Future  7. Chronic pain of right knee  -     Iron and TIBC; Future      Follow up and Dispositions:  Return in about 1 year (around 02/24/2023) for Sawv.       Nicholes Rough, MD     An electronic signature was used to authenticate this note.Medicare Annual Wellness Visit    TRENICE MESA is here for Medicare AWV (Breast exam as well as sleep issue pt has been up and down with her sleeping this has been going on for months now pt is taking Mag,trazadon and melatonin .)    Assessment & Plan   Medicare annual wellness visit, subsequent  Restless legs syndrome  Assessment & Plan:  Chronic, not controlled.  Increase dose of Mirapex and monitor for efficacy and side effects.  Check iron levels and correct if low  Restless leg syndrome  Assessment & Plan:  Chronic, not controlled.  Increase dose of Mirapex and monitor for efficacy and side effects.  Check iron levels and correct if low  Orders:  -     pramipexole (MIRAPEX) 1 MG tablet; Take 1.5 tablets by mouth every evening 1.5 TABLETS AT BEDTIME ORALLY 90 DAYS, Disp-135 tablet, R-3Normal  -     CBC with Auto Differential; Future  -     Comprehensive Metabolic Panel; Future  -     Iron and TIBC; Future  Primary insomnia  -     Comprehensive Metabolic Panel; Future  Acquired hypothyroidism  Assessment & Plan:  Chronic problem, controlled, continue current treatment   Orders:  -     Comprehensive Metabolic Panel; Future  -     TSH with Reflex; Future  Dyslipidemia  -     Lipid Panel; Future  Chronic pain of right knee  -     Iron and TIBC; Future    Recommendations for Preventive Services Due: see orders and patient instructions/AVS.  Recommended screening schedule  for the next 5-10 years is provided to the patient in written form: see Patient Instructions/AVS.     Return in about 1 year (around 02/24/2023) for Sawv.     Subjective       Patient's complete Health Risk Assessment and screening values have been reviewed and are found in Flowsheets. The following  problems were reviewed today and where indicated follow up appointments were made and/or referrals ordered.    Positive Risk Factor Screenings with Interventions:    Fall Risk:  Do you feel unsteady or are you worried about falling? : no  2 or more falls in past year?: no  Fall with injury in past year?: (!) yes     Interventions:    See AVS for additional education material             Social and Emotional Support:  Do you get the social and emotional support that you need?: (!) No    Interventions:  See AVS for additional education material      Hearing Screen:  Do you or your family notice any trouble with your hearing that hasn't been managed with hearing aids?: (!) Yes    Interventions:  See AVS for additional education material      ADL's:   Patient reports needing help with:  Select all that apply: (!) Housekeeping  Interventions:  See AVS for additional education material                    Objective   Vitals:    02/23/22 0931   BP: 110/62   Pulse: 55   Temp: 97.9 F (36.6 C)   TempSrc: Oral   SpO2: 94%   Weight: 123 lb (55.8 kg)   Height: 5\' 5"  (1.651 m)      Body mass index is 20.47 kg/m.               Allergies   Allergen Reactions    Amitriptyline Hcl      Other reaction(s): sleep walking    Aspirin      Other reaction(s): bleeding ulcers    Codeine      Other reaction(s): Unknown    Pseudoeph-Bromphen-Cod      Other reaction(s): Hallucinations     Prior to Visit Medications    Medication Sig Taking? Authorizing Provider   magnesium oxide (MAG-OX) 140 MG CAPS 500 mg Yes Historical Provider, MD   vitamin D-3 (CHOLECALCIFEROL) 125 MCG (5000 UT) TABS 5,000 Int'l Units Yes Historical Provider, MD   Bacillus  Coagulans-Inulin (PROBIOTIC FORMULA) 1-250 BILLION-MG CAPS  Yes Historical Provider, MD   acetaminophen (TYLENOL) 650 MG extended release tablet  Yes Historical Provider, MD   zoster recombinant adjuvanted vaccine (SHINGRIX) 50 MCG/0.5ML SUSR injection  Yes Historical Provider, MD   pramipexole (MIRAPEX) 1 MG tablet Take 1.5 tablets by mouth every evening 1.5 TABLETS AT BEDTIME ORALLY 90 DAYS Yes , MD   RA Vitamin E-Vit A & D 20000 units CREA 1 tablet Orally Twice a day Yes Historical Provider, MD   traZODone (DESYREL) 100 MG tablet TAKE 2 TABLETS BY MOUTH EVERY DAY AT BEDTIME Yes Historical Provider, MD   omeprazole (PRILOSEC) 40 MG delayed release capsule 1 capsule Yes [facility] [employer], MD       CareTeam (Including outside providers/suppliers regularly involved in providing care):   Patient Care Team:  Nicholes Rough, MD as PCP - General (Internal Medicine)  Nicholes Rough, MD as PCP - Empaneled Provider  Nicholes Rough, MD     Reviewed and updated this visit:  Tobacco  Allergies  Meds  Problems  Med Hx  Surg Hx  Soc Hx  Fam Hx

## 2022-02-23 NOTE — Patient Instructions (Signed)
Preventing Falls: Care Instructions    Talk to your doctor about the medicines you take. Ask if any of them increase the risk of falls and whether they can be changed or stopped.   Try to exercise regularly. It can help improve your strength and balance. This can help lower your risk of falling.     Practice fall safety and prevention.    Wear low-heeled shoes that fit well and give your feet good support. Talk to your doctor if you have foot problems that make this hard.  Carry a cellphone or wear a medical alert device that you can use to call for help.  Use stepladders instead of chairs to reach high objects. Don't climb if you're at risk for falls. Ask for help, if needed.  Wear the correct eyeglasses, if you need them.    Make your home safer.    Remove rugs, cords, clutter, and furniture from walkways.  Keep your house well lit. Use night-lights in hallways and bathrooms.  Install and use sturdy handrails on stairways.  Wear nonskid footwear, even inside. Don't walk barefoot or in socks without shoes.    Be safe outside.    Use handrails, curb cuts, and ramps whenever possible.  Keep your hands free by using a shoulder bag or backpack.  Try to walk in well-lit areas. Watch out for uneven ground, changes in pavement, and debris.  Be careful in the winter. Walk on the grass or gravel when sidewalks are slippery. Use de-icer on steps and walkways. Add non-slip devices to shoes.    Put grab bars and nonskid mats in your shower or tub and near the toilet. Try to use a shower chair or bath bench when bathing.   Get into a tub or shower by putting in your weaker leg first. Get out with your strong side first. Have a phone or medical alert device in the bathroom with you.   Where can you learn more?  Go to https://www.bennett.info/ and enter G117 to learn more about "Preventing Falls: Care Instructions."  Current as of: November 9, 2022Content Version: 13.7   2006-2023 Healthwise,  Incorporated.   Care instructions adapted under license by [facility] If you have questions about a medical condition or this instruction, always ask your healthcare professional. [place] any warranty or liability for your use of this information.      For more information on your local Area Agency on Aging or Council on Aging please visit the appropriate web site below:    [place]: ChurchReunion.gl    [place]: https://aging.DirectoryZip.se    [place]: https://green-martinez.com/    [place]: https://ward.info/           Hearing Loss: Care Instructions  Overview     Hearing loss is a sudden or slow decrease in how well you hear. It can range from slight to profound. Permanent hearing loss can occur with aging. It also can happen when you are exposed long-term to loud noise. Examples include listening to loud music, riding motorcycles, or being around other loud machines.  Hearing loss can affect your work and home life. It can make you feel lonely or depressed. You may feel that you have lost your independence. But hearing aids and other devices can help you hear better and feel connected to others.  Follow-up care is a key part of your treatment and safety. Be sure to make and go to all appointments, and call your doctor if you are having problems.  It's also a good idea to know your test results and keep a list of the medicines you take.  How can you care for yourself at home?  Avoid loud noises whenever possible. This helps keep your hearing from getting worse.  Always wear hearing protection around loud noises.  Wear a hearing aid as directed.  A professional can help you pick a hearing aid that will work best for you.  You can also get hearing aids over the counter for mild to moderate hearing loss.  Have hearing tests as your doctor suggests. They can show whether your hearing has changed. Your hearing aid may need to be adjusted.  Use other  devices as needed. These may include:  Telephone amplifiers and hearing aids that can connect to a television, stereo, radio, or microphone.  Devices that use lights or vibrations. These alert you to the doorbell, a ringing telephone, or a baby monitor.  Television closed-captioning. This shows the words at the bottom of the screen. Most new TVs can do this.  TTY (text telephone). This lets you type messages back and forth on the telephone instead of talking or listening. These devices are also called TDD. When messages are typed on the keyboard, they are sent over the phone line to a receiving TTY. The message is shown on a monitor.  Use text messaging, social media, and email if it is hard for you to communicate by telephone.  Try to learn a listening technique called speechreading. It is not lipreading. You pay attention to people's gestures, expressions, posture, and tone of voice. These clues can help you understand what a person is saying. Face the person you are talking to, and have them face you. Make sure the lighting is good. You need to see the other person's face clearly.  Think about counseling if you need help to adjust to your hearing loss.  When should you call for help?  Watch closely for changes in your health, and be sure to contact your doctor if:   You think your hearing is getting worse.    You have new symptoms, such as dizziness or nausea.   Where can you learn more?  Go to RecruitSuit.ca and enter R798 to learn more about "Hearing Loss: Care Instructions."  Current as of: March 1, 2023Content Version: 13.7   2006-2023 Healthwise, Incorporated.   Care instructions adapted under license by [facility]. If you have questions about a medical condition or this instruction, always ask your healthcare professional. Healthwise, Incorporated disclaims any warranty or liability for your use of this information.           Learning About Activities of Daily  Living  What are activities of daily living?     Activities of daily living (ADLs) are the basic self-care tasks you do every day. As you age, and if you have health problems, you may find that it's harder to do these things for yourself. That's when you may need some help.  Your doctor uses ADLs to measure how much help you need. Knowing what you can and can't do for yourself is an important first step to getting help. And when you have the help you need, you can stay as independent as possible.  Your doctor will want to know if you are able to do tasks such as:  Take a bath or shower without help.  Go to the bathroom by yourself.  Dress and undress without help.  Shave, comb your hair, and  brush teeth on your own.  Get in and out of bed or a chair without help.  Feed yourself without help.  If you are having trouble doing basic self-care tasks, talk with your doctor. You may want to bring a caregiver or family member who can help the doctor understand your needs and abilities.  How will a doctor assess your ADLs?  Asking about ADLs is part of a routine health checkup your doctor will likely do as you age. Your health check might be done in a doctor's office, in your home, or at a hospital. The goal is to find out if you are having any problems that could make your health problems worse or that make it unsafe for you to be on your own.  To measure your ADLs, your doctor will ask how hard it is for you to do routine tasks. He or she may also want to know if you have changed the way you do a task because of a health problem. He or she may watch how you:  Walk back and forth.  Keep your balance while you stand or walk.  Move from sitting to standing or from a bed to a chair.  Button or unbutton a [employer] fast streamer.  Remove and put on your shoes.  It's normal to feel a little worried or anxious if you find you can't do all the things you used to be able to do. Talking with your doctor about ADLs isn't a test that you  either pass or fail. It's just a way to get more information about your health and safety.  Follow-up care is a key part of your treatment and safety. Be sure to make and go to all appointments, and call your doctor if you are having problems. It's also a good idea to know your test results and keep a list of the medicines you take.  Current as of: February 27, 2023Content Version: 13.7   2006-2023 Healthwise, Incorporated.   Care instructions adapted under license by [facility]. If you have questions about a medical condition or this instruction, always ask your healthcare professional. Healthwise, Incorporated disclaims any warranty or liability for your use of this information.           A Healthy Heart: Care Instructions  Your Care Instructions     Coronary artery disease, also called heart disease, occurs when a substance called plaque builds up in the vessels that supply oxygen-rich blood to your heart muscle. This can narrow the blood vessels and reduce blood flow. A heart attack happens when blood flow is completely blocked. A high-fat diet, smoking, and other factors increase the risk of heart disease.  Your doctor has found that you have a chance of having heart disease. You can do lots of things to keep your heart healthy. It may not be easy, but you can change your diet, exercise more, and quit smoking. These steps really work to lower your chance of heart disease.  Follow-up care is a key part of your treatment and safety. Be sure to make and go to all appointments, and call your doctor if you are having problems. It's also a good idea to know your test results and keep a list of the medicines you take.  How can you care for yourself at home?  Diet   Use less salt when you cook and eat. This helps lower your blood pressure. Taste food before salting. Add only a little salt when you  think you need it. With time, your taste buds will adjust to less salt.    Eat fewer snack items, fast  foods, canned soups, and other high-salt, high-fat, processed foods.    Read food labels and try to avoid saturated and trans fats. They increase your risk of heart disease by raising cholesterol levels.    Limit the amount of solid fat-butter, margarine, and shortening-you eat. Use olive, peanut, or canola oil when you cook. Bake, broil, and steam foods instead of frying them.    Eat a variety of fruit and vegetables every day. Dark green, deep orange, red, or yellow fruits and vegetables are especially good for you. Examples include spinach, carrots, peaches, and berries.    Foods high in fiber can reduce your cholesterol and provide important vitamins and minerals. High-fiber foods include whole-grain cereals and breads, oatmeal, beans, brown rice, citrus fruits, and apples.    Eat lean proteins. Heart-healthy proteins include seafood, lean meats and poultry, eggs, beans, peas, nuts, seeds, and soy products.    Limit drinks and foods with added sugar. These include candy, desserts, and soda pop.   Lifestyle changes   If your doctor recommends it, get more exercise. Walking is a good choice. Bit by bit, increase the amount you walk every day. Try for at least 30 minutes on most days of the week. You also may want to swim, bike, or do other activities.    Do not smoke. If you need help quitting, talk to your doctor about stop-smoking programs and medicines. These can increase your chances of quitting for good. Quitting smoking may be the most important step you can take to protect your heart. It is never too late to quit.    Limit alcohol to 2 drinks a day for men and 1 drink a day for women. Too much alcohol can cause health problems.    Manage other health problems such as diabetes, high blood pressure, and high cholesterol. If you think you may have a problem with alcohol or drug use, talk to your doctor.   Medicines   Take your medicines exactly as prescribed. Call your doctor if you think you are  having a problem with your medicine.    If your doctor recommends aspirin, take the amount directed each day. Make sure you take aspirin and not another kind of pain reliever, such as acetaminophen (Tylenol).   When should you call for help?   Call 911 if you have symptoms of a heart attack. These may include:   Chest pain or pressure, or a strange feeling in the chest.    Sweating.    Shortness of breath.    Pain, pressure, or a strange feeling in the back, neck, jaw, or upper belly or in one or both shoulders or arms.    Lightheadedness or sudden weakness.    A fast or irregular heartbeat.   After you call 911, the operator may tell you to chew 1 adult-strength or 2 to 4 low-dose aspirin. Wait for an ambulance. Do not try to drive yourself.  Watch closely for changes in your health, and be sure to contact your doctor if you have any problems.  Where can you learn more?  Go to RecruitSuit.ca and enter F075 to learn more about "A Healthy Heart: Care Instructions."  Current as of: September 7, 2022Content Version: 13.7   2006-2023 Healthwise, Incorporated.   Care instructions adapted under license by [facility]. If you have questions about a  medical condition or this instruction, always ask your healthcare professional. Healthwise, Incorporated disclaims any warranty or liability for your use of this information.      Personalized Preventive Plan for Diana [name] - 02/23/2022  Medicare offers a range of preventive health benefits. Some of the tests and screenings are paid in full while other may be subject to a deductible, co-insurance, and/or copay.    Some of these benefits include a comprehensive review of your medical history including lifestyle, illnesses that may run in your family, and various assessments and screenings as appropriate.    After reviewing your medical record and screening and assessments performed today your provider may have ordered immunizations,  labs, imaging, and/or referrals for you.  A list of these orders (if applicable) as well as your Preventive Care list are included within your After Visit Summary for your review.    Other Preventive Recommendations:    A preventive eye exam performed by an eye specialist is recommended every 1-2 years to screen for glaucoma; cataracts, macular degeneration, and other eye disorders.  A preventive dental visit is recommended every 6 months.  Try to get at least 150 minutes of exercise per week or 10,000 steps per day on a pedometer .  Order or download the FREE "Exercise & Physical Activity: Your Everyday Guide" from [employer] on Aging. Call [phone] or search [employer] on Aging online.  You need 1200-1500 mg of calcium and 1000-2000 IU of vitamin D per day. It is possible to meet your calcium requirement with diet alone, but a vitamin D supplement is usually necessary to meet this goal.  When exposed to the sun, use a sunscreen that protects against both UVA and UVB radiation with an SPF of 30 or greater. Reapply every 2 to 3 hours or after sweating, drying off with a towel, or swimming.  Always wear a seat belt when traveling in a car. Always wear a helmet when riding a bicycle or motorcycle.

## 2022-03-02 NOTE — Assessment & Plan Note (Addendum)
Chronic, not controlled.  Increase dose of Mirapex and monitor for efficacy and side effects.  Check iron levels and correct if low

## 2022-03-02 NOTE — Assessment & Plan Note (Signed)
Chronic problem, controlled, continue current treatment

## 2022-03-03 ENCOUNTER — Encounter

## 2022-03-04 MED ORDER — PRAMIPEXOLE DIHYDROCHLORIDE 1 MG PO TABS
1 MG | ORAL_TABLET | Freq: Every evening | ORAL | 3 refills | Status: DC
Start: 2022-03-04 — End: 2023-02-27

## 2022-03-04 NOTE — Telephone Encounter (Signed)
Spoke with [facility], they are asking for a new prescription to be sent.

## 2022-03-23 ENCOUNTER — Inpatient Hospital Stay: Admit: 2022-03-23 | Discharge: 2022-03-24 | Disposition: A | Discharge status: Home / Self Care | Payer: MEDICARE

## 2022-03-23 DIAGNOSIS — N3091 Cystitis, unspecified with hematuria: Secondary | ICD-10-CM

## 2022-03-23 NOTE — Other (Signed)
Positive urine, sensitive to Keflex (cefazolin) which pt was prescribed.

## 2022-03-23 NOTE — Discharge Instructions (Signed)
Return to the ER for any worsening urinary symptoms, abdominal pain, back pain, fevers, vomiting or other concerns.

## 2022-03-23 NOTE — ED Provider Notes (Signed)
[facility] DEPT  EMERGENCY DEPARTMENT ENCOUNTER      Pt Name: Diana [name]  MRN: [id number]  Birthdate [birth year]  Date of evaluation: 03/23/2022  Provider: Al Corpus, PA-C    CHIEF COMPLAINT       Chief Complaint   Patient presents with    Dysuria     Dysuria and frequency x few days. Denies bloody urine/fever. No suprapubic tenderness.         HISTORY OF PRESENT ILLNESS    Patient is a pleasant 76 year old female with a history of hypertension anemia, arthritis presenting today for urinary frequency.  Onset about 5 days ago, progressively worsening with mild dysuria.  She has not had any abdominal pain, fevers, nausea, vomiting.  No history of frequent UTIs.             REVIEW OF SYSTEMS       Review of Systems   Constitutional:  Negative for chills and fever.   HENT:  Negative for rhinorrhea, sore throat and trouble swallowing.    Respiratory:  Negative for cough and shortness of breath.    Cardiovascular:  Negative for chest pain and palpitations.   Gastrointestinal:  Negative for abdominal pain, diarrhea, nausea and vomiting.   Genitourinary:  Positive for dysuria and frequency. Negative for flank pain and hematuria.   Skin:  Negative for rash.   Neurological:  Negative for dizziness and headaches.     PAST MEDICAL HISTORY     Past Medical History:   Diagnosis Date    Anemia     Arthritis     Bleeding ulcer     DDD (degenerative disc disease), lumbar     History of blood transfusion     Hypertension     Hypothyroidism     Osteoporosis     Scoliosis        SURGICAL HISTORY       Past Surgical History:   Procedure Laterality Date    JOINT REPLACEMENT Left 08/2014    KNEE ARTHROSCOPY Left 2010    NOSE SURGERY      RESET AFTER BEING BROKEN    TOTAL HIP ARTHROPLASTY  08/23/2015    TOTAL KNEE ARTHROPLASTY Right 04/2018    TUBAL LIGATION         CURRENT MEDICATIONS       Previous Medications    ACETAMINOPHEN (TYLENOL) 650 MG EXTENDED RELEASE TABLET        BACILLUS COAGULANS-INULIN (PROBIOTIC FORMULA)  1-250 BILLION-MG CAPS        MAGNESIUM OXIDE (MAG-OX) 140 MG CAPS    500 mg    OMEPRAZOLE (PRILOSEC) 40 MG DELAYED RELEASE CAPSULE    1 capsule    PRAMIPEXOLE (MIRAPEX) 1 MG TABLET    Take 1.5 tablets by mouth every evening 1.5 TABLETS AT BEDTIME ORALLY 90 DAYS    RA VITAMIN E-VIT A & D 20000 UNITS CREA    1 tablet Orally Twice a day    TRAZODONE (DESYREL) 100 MG TABLET    TAKE 2 TABLETS BY MOUTH EVERY DAY AT BEDTIME    VITAMIN D-3 (CHOLECALCIFEROL) 125 MCG (5000 UT) TABS    5,000 Int'l Units    ZOSTER RECOMBINANT ADJUVANTED VACCINE (SHINGRIX) 50 MCG/0.5ML SUSR INJECTION           ALLERGIES     Amitriptyline hcl, Aspirin, Codeine, and Pseudoeph-bromphen-cod    FAMILY HISTORY       Family History   Problem Relation Age of Onset  Diabetes Mother     Stroke Mother     Rheum Arthritis Mother     Coronary Art Dis Mother     Heart Disease Father     Alzheimer's Disease Father     Coronary Art Dis Father     No Known Problems Brother     No Known Problems Daughter         SOCIAL HISTORY       Social History     Socioeconomic History    Marital status: Married     Spouse name: None    Number of children: None    Years of education: None    Highest education level: None   Tobacco Use    Smoking status: Never    Smokeless tobacco: Never   Vaping Use    Vaping Use: Never used   Substance and Sexual Activity    Alcohol use: Yes    Drug use: Never    Sexual activity: Not Currently     Partners: Male     Social Determinants of Health     Physical Activity: Insufficiently Active    Days of Exercise per Week: 1 day    Minutes of Exercise per Session: 30 min       PHYSICAL EXAM     Vitals:    Vitals:    03/23/22 1958 03/23/22 2000   BP:  (!) 157/81   Pulse: 76    Resp:  19   Temp: 97.9 F (36.6 C)    TempSrc: Oral    SpO2:  97%   Weight: 54.4 kg    Height: 5\' 5"  (1.651 m)        Physical Exam  Vitals and nursing note reviewed.   Constitutional:       General: She is not in acute distress.  HENT:      Head: Normocephalic and  atraumatic.      Mouth/Throat:      Mouth: Mucous membranes are moist.      Comments: Airway patent  Eyes:      Conjunctiva/sclera: Conjunctivae normal.   Cardiovascular:      Rate and Rhythm: Normal rate and regular rhythm.   Pulmonary:      Effort: No respiratory distress.      Breath sounds: No wheezing or rhonchi.   Abdominal:      Tenderness: There is no abdominal tenderness. There is no right CVA tenderness or left CVA tenderness.   Musculoskeletal:         General: Normal range of motion.      Cervical back: Neck supple.      Comments: Neurovascular intact distally   Skin:     General: Skin is warm and dry.   Neurological:      Mental Status: She is alert and oriented to person, place, and time. Mental status is at baseline.   Psychiatric:         Mood and Affect: Mood normal.         Behavior: Behavior normal.       Procedures    DIAGNOSTIC RESULTS       RADIOLOGY:   No orders to display       LABS:  Labs Reviewed   URINALYSIS W/ RFLX MICROSCOPIC - Abnormal; Notable for the following components:       Result Value    Blood, Urine Large (*)     Protein, UA 100 (*)     Leukocyte Esterase,  Urine Large (*)     All other components within normal limits   CULTURE, URINE       All other labs were within normal range or not returned as of this dictation.    EMERGENCY DEPARTMENT COURSE/MDM:   Medical Decision Making  This is a pleasant 76 year old female with a history of hypertension, anemia presenting today for dysuria and urinary frequency.  Onset 5 days ago.  Denies any fevers, nausea, vomiting, back pain, abdominal patient is afebrile hemodynamically stable, comfortable nontoxic-appearing.  O2 sat above 92% on room air.  Abdomen is soft nontender.  No CVA tenderness.  Urinalysis with evidence of acute hemorrhagic cystitis.  Will treat with Keflex.  Advised to follow-up primary care return to the ER for any worsening symptoms, pain, vomiting, fevers.    Amount and/or Complexity of Data Reviewed  Labs:  ordered.    Risk  Prescription drug management.              CONSULTS:  None      FINAL IMPRESSION      1. Hemorrhagic cystitis          DISPOSITION/PLAN   DISPOSITION Decision To Discharge 03/23/2022 08:30:31 PM      PATIENT REFERRED TO:  Nicholes Rough, MD  [address]  [address]  [place] [address]  [id number]            DISCHARGE MEDICATIONS:  New Prescriptions    CEPHALEXIN (KEFLEX) 500 MG CAPSULE    Take 1 capsule by mouth 2 times daily for 7 days     Controlled Substances Monitoring:     No flowsheet data found.    (Please note that portions of this note were completed with a voice recognition program.  Efforts were made to edit the dictations but occasionally words are mis-transcribed.)    Al Corpus, PA-C (electronically signed)  Attending Emergency Physician           Al Corpus, PA-C  03/23/22 2032

## 2022-03-24 LAB — MICROSCOPIC URINALYSIS: MUCUS, URINE: NONE SEEN /LPF

## 2022-03-24 LAB — URINALYSIS W/ RFLX MICROSCOPIC
Bilirubin Urine: NEGATIVE
Glucose, UA: NEGATIVE
Ketones, Urine: NEGATIVE
Nitrite, Urine: NEGATIVE
Protein, UA: 100 — AB
Specific Gravity, UA: 1.025 (ref 1.003–1.035)
Urobilinogen, Urine: 0.2 EU/dL
pH, UA: 7 (ref 4.5–8.0)

## 2022-03-24 MED ORDER — CEPHALEXIN 500 MG PO CAPS
500 MG | Freq: Once | ORAL | Status: AC
Start: 2022-03-24 — End: 2022-03-23
  Administered 2022-03-24: 01:00:00 500 mg via ORAL

## 2022-03-24 MED ORDER — CEPHALEXIN 500 MG PO CAPS
500 MG | ORAL_CAPSULE | Freq: Two times a day (BID) | ORAL | 0 refills | Status: AC
Start: 2022-03-24 — End: 2022-03-30

## 2022-03-24 MED FILL — CEPHALEXIN 500 MG PO CAPS: 500 MG | ORAL | Qty: 1

## 2022-03-25 LAB — CULTURE, URINE: FINAL REPORT: >100000 — AB

## 2022-04-27 DIAGNOSIS — I10 Essential (primary) hypertension: Secondary | ICD-10-CM | POA: Diagnosis not present

## 2022-06-06 ENCOUNTER — Telehealth

## 2022-06-06 NOTE — Telephone Encounter (Signed)
Patient is due for Reclast. Please set VV. Labs ordered

## 2022-06-08 ENCOUNTER — Encounter

## 2022-06-08 LAB — PTH, INTACT: Pth Intact: 13.9 pg/mL — ABNORMAL LOW (ref 15.00–65.00)

## 2022-06-08 LAB — CBC WITH AUTO DIFFERENTIAL
Absolute Baso #: 0 10*3/uL (ref 0.0–0.2)
Absolute Eos #: 0.1 10*3/uL (ref 0.0–0.5)
Absolute Lymph #: 2.1 10*3/uL (ref 1.0–3.2)
Absolute Mono #: 0.6 10*3/uL (ref 0.3–1.0)
Basophils %: 0.4 % (ref 0.0–2.0)
Eosinophils %: 1.6 % (ref 0.0–7.0)
Hematocrit: 36.2 % (ref 34.0–47.0)
Hemoglobin: 12 g/dL (ref 11.5–15.7)
Immature Grans (Abs): 0.01 10*3/uL (ref 0.00–0.06)
Immature Granulocytes: 0.2 % (ref 0.0–0.6)
Lymphocytes: 37.7 % (ref 15.0–45.0)
MCH: 30.8 pg (ref 27.0–34.5)
MCHC: 33.1 g/dL (ref 32.0–36.0)
MCV: 93.1 fL (ref 81.0–99.0)
MPV: 9.5 fL (ref 7.2–13.2)
Monocytes: 10 % (ref 4.0–12.0)
NRBC Absolute: 0 10*3/uL (ref 0.000–0.012)
NRBC Automated: 0 % (ref 0.0–0.2)
Neutrophils %: 50.1 % (ref 42.0–74.0)
Neutrophils Absolute: 2.8 10*3/uL (ref 1.6–7.3)
Platelets: 306 10*3/uL (ref 140–440)
RBC: 3.89 x10e6/mcL (ref 3.60–5.20)
RDW: 14.2 % (ref 11.0–16.0)
WBC: 5.6 10*3/uL (ref 3.8–10.6)

## 2022-06-08 LAB — COMPREHENSIVE METABOLIC PANEL
ALT: 37 U/L — ABNORMAL HIGH (ref 0–35)
AST: 40 U/L — ABNORMAL HIGH (ref 0–35)
Albumin/Globulin Ratio: 2.2 (ref 1.00–2.70)
Albumin: 4.6 g/dL (ref 3.5–5.2)
Alk Phosphatase: 70 U/L (ref 35–117)
Anion Gap: 9 mmol/L (ref 2–17)
BUN: 17 mg/dL (ref 8–23)
CO2: 29 mmol/L (ref 22–29)
Calcium: 10.4 mg/dL — ABNORMAL HIGH (ref 8.8–10.2)
Chloride: 103 mmol/L (ref 98–107)
Creatinine: 0.8 mg/dL (ref 0.5–1.0)
Est, Glom Filt Rate: 76 mL/min/1.73m (ref >=60)
Globulin: 2.1 g/dL (ref 1.9–4.4)
Glucose: 121 mg/dL — ABNORMAL HIGH (ref 70–99)
OSMOLALITY CALCULATED: 284 mOsm/kg (ref 270–287)
Potassium: 4.5 mmol/L (ref 3.5–5.3)
Sodium: 141 mmol/L (ref 135–145)
Total Bilirubin: 0.34 mg/dL (ref 0.00–1.20)
Total Protein: 6.7 g/dL (ref 6.4–8.3)

## 2022-06-08 LAB — LIPID PANEL
Chol/HDL Ratio: 2.6 (ref 0.0–4.4)
Cholesterol: 202 mg/dL — ABNORMAL HIGH (ref 100–200)
HDL: 78 mg/dL (ref >=50)
LDL Cholesterol: 99.8 mg/dL (ref 0.0–100.0)
LDL/HDL Ratio: 1.3
Triglycerides: 121 mg/dL (ref 0–149)
VLDL: 24.2 mg/dL (ref 5.0–40.0)

## 2022-06-08 LAB — BASIC METABOLIC PANEL
Anion Gap: 10 mmol/L (ref 2–17)
BUN: 18 mg/dL (ref 8–23)
CO2: 28 mmol/L (ref 22–29)
Calcium: 10.7 mg/dL — ABNORMAL HIGH (ref 8.8–10.2)
Chloride: 103 mmol/L (ref 98–107)
Creatinine: 0.7 mg/dL (ref 0.5–1.0)
Est, Glom Filt Rate: 90 mL/min/1.73m (ref >=60)
Glucose: 120 mg/dL — ABNORMAL HIGH (ref 70–99)
OSMOLALITY CALCULATED: 284 mOsm/kg (ref 270–287)
Potassium: 4.3 mmol/L (ref 3.5–5.3)
Sodium: 141 mmol/L (ref 135–145)

## 2022-06-08 LAB — VITAMIN D 25 HYDROXY: Vit D, 25-Hydroxy: 71.1 ng/mL (ref 30.0–90.0)

## 2022-06-08 NOTE — Other (Signed)
Please stop taking all calcium at this time. Your levels are too high. Please call the clinic to set up your followup at [phone]. We have sent several messages.      Thanks,   Melinda Crutch Osteoporosis Investment banker, operational

## 2022-06-13 DIAGNOSIS — I10 Essential (primary) hypertension: Secondary | ICD-10-CM | POA: Diagnosis not present

## 2022-06-24 ENCOUNTER — Telehealth: Admit: 2022-06-24 | Payer: MEDICARE | Attending: Medical | Primary: Internal Medicine

## 2022-06-24 DIAGNOSIS — M810 Age-related osteoporosis without current pathological fracture: Secondary | ICD-10-CM

## 2022-06-24 NOTE — Progress Notes (Signed)
[facility]   Established Patient Note  Roney Jaffe, PA-C    HISTORY  Diana [name] is a 76 y.o. female who presents for continued osteoporosis management.     Evenity: completed 2021  Reclast: 07/2020, 08/04/21  Medication adverse effects:  Denies  Falls since last visit:  Denies  Fractures since last visit: Denies  Taking calcium and vitamin D: Admits. She had been taking 1200mg  calcium. She is taking vitamin D every other day.  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     This was a phone only virtual visit and the patient gave consent.    RELEVANT REVIEW OF SYSTEMS IS NOTED IN THE HPI    Not on File     No current outpatient medications on file.     No current facility-administered medications for this visit.       No past medical history on file.     No past surgical history on file.    No family history on file.    Social History     Socioeconomic History    Marital status: Married     Spouse name: Not on file    Number of children: Not on file    Years of education: Not on file    Highest education level: Not on file   Occupational History    Not on file   Tobacco Use    Smoking status: Not on file    Smokeless tobacco: Not on file   Substance and Sexual Activity    Alcohol use: Not on file    Drug use: Not on file    Sexual activity: Not on file   Other Topics Concern    Not on file   Social History Narrative    Not on file     Social Determinants of Health     Financial Resource Strain: Not on file   Food Insecurity: Not on file   Transportation Needs: Not on file   Physical Activity: Not on file   Stress: Not on file   Social Connections: Not on file   Intimate Partner Violence: Not on file   Housing Stability: Not on file        PHYSICAL EXAM   Not done, since this is a virtual visit.    DIAGNOSIS    ICD-10-CM    1. Age-related osteoporosis without current pathological fracture  M81.0           PLAN  Osteoporosis ( femoral  neck, total hip)  DEXA ( 06/18/21)  Fragility fracture ( hip, 2016)  Fall risk ( moderate)  Evenity (completed 06/03/20)  Reclast (07/2020, 08/04/21)    DEXA from 2018 and 2020 and 2022 were reviewed.   Lumbar T score of -0.9, 1.9 with 32.3% gain  Femoral neck T score of --2.4, -2.9, -2.5 with 7.6% gain  Total hip T score of -2.3 -3.0, -2.6 with 7.9% gain  Beautiful response to Evenity and Reclast.      Labs reviewed. We discussed how each of these can contribute to osteoporosis.   CMP normal renal and corrected calcium levels (9.9 corrected). Creatinine clearance was calculated to be at least 62mL/min.  PTH low.  Vitamin D is now in the normal range, but it was previously high    Will stop calcium, continue 2000IU vitamin D, and recheck labs in 6 weeks with PTHrp.    We discussed that calcium  intake should be 1000-1200mg  a day and vitamin D should be 2000IU, which can come from diet or supplementation.     We discussed that 150 minutes of weight bearing exercise a week is recommended to strengthen bones naturally. Both weight bearing and core muscle strengthening were recommended to improve bone quality and reduce falls. This can include walking, yoga, pilates, weight or resistance training. If the patient feels unsafe to do these, alternative weight bearing exercises can be done sitting with ankle weights and hand weights.    This patient has done well with prior Reclast, and the next dose will be ordered.    Reclast counseling included: once a year IV infusion, contraindicated if renal disease, atypical femoral fractures, and osteonecrosis of the jaw. Acute phase reaction was discussed and the patient can reduce symptoms by taking APAP and an antihistamine the morning of the infusion and continuing the next day, and taking 5000IU Vitamin D3 every day the week prior to the infusion. We discussed the estimated 58% spine fracture reduction and 26% hip fracture reduction on this medication over a 5 year course of  therapy.     The plan for this patient is vitamin D and calcium supplementation, healthy lifestyle choices, weight bearing exercise, and bisphosphonates for 5 years total. Follow up in 1 year. All patient questions were answered and they are in agreement with this plan.     AUDIO ONLY VIRTUAL VISIT    Total Time spent for this encounter: 12 minutes    Diana [name] is a 76 y.o. female evaluated via telephone on 07/07/2021 was evaluated through a synchronous (real-time) audio encounter. Patient identification was verified at the start of the visit. She (or guardian if applicable) is aware that this is a billable service, which includes applicable co-pays. This visit was conducted with the patient's (and/or legal guardian's) verbal consent. She has not had a related appointment within my department in the past 7 days or scheduled within the next 24 hours.   The patient was located at Home: [address]  [place] [address].  The provider was located at [facility] (Appt Dept): [address] [place] [address].        -Sarinah Doetsch B Atoya Andrew, PA-C  Program Director  [facility]    --Quentin Angst, [place] on 07/07/2021 at 8:16 AM  An electronic signature was used to authenticate this note.

## 2022-06-29 ENCOUNTER — Other Ambulatory Visit: Payer: Self-pay | Admitting: Internal Medicine

## 2022-06-29 DIAGNOSIS — Z1231 Encounter for screening mammogram for malignant neoplasm of breast: Secondary | ICD-10-CM

## 2022-07-15 ENCOUNTER — Inpatient Hospital Stay: Admit: 2022-07-15 | Discharge: 2022-07-15 | Disposition: A | Discharge status: Home / Self Care | Payer: MEDICARE

## 2022-07-15 ENCOUNTER — Emergency Department: Admit: 2022-07-15 | Payer: MEDICARE | Primary: Internal Medicine

## 2022-07-15 DIAGNOSIS — L03115 Cellulitis of right lower limb: Secondary | ICD-10-CM

## 2022-07-15 DIAGNOSIS — L0390 Cellulitis, unspecified: Secondary | ICD-10-CM

## 2022-07-15 MED ORDER — CEPHALEXIN 500 MG PO CAPS
500 MG | ORAL_CAPSULE | Freq: Three times a day (TID) | ORAL | 0 refills | Status: AC
Start: 2022-07-15 — End: 2022-07-22

## 2022-07-15 MED ORDER — METHYLPREDNISOLONE 4 MG PO TBPK
4 MG | PACK | ORAL | 0 refills | Status: AC
Start: 2022-07-15 — End: 2022-07-21

## 2022-07-15 MED ORDER — CEPHALEXIN 500 MG PO CAPS
500 MG | Freq: Once | ORAL | Status: AC
Start: 2022-07-15 — End: 2022-07-15
  Administered 2022-07-15: 16:00:00 1000 mg via ORAL

## 2022-07-15 MED ORDER — DEXAMETHASONE SOD PHOSPHATE PF 10 MG/ML IJ SOLN
10 MG/ML | Freq: Once | INTRAMUSCULAR | Status: AC
Start: 2022-07-15 — End: 2022-07-15
  Administered 2022-07-15: 16:00:00 10 mg via ORAL

## 2022-07-15 MED FILL — DEXAMETHASONE SOD PHOSPHATE PF 10 MG/ML IJ SOLN: 10 MG/ML | INTRAMUSCULAR | Qty: 1

## 2022-07-15 MED FILL — CEPHALEXIN 500 MG PO CAPS: 500 MG | ORAL | Qty: 2

## 2022-07-15 NOTE — ED Provider Notes (Signed)
[facility] DEPT  EMERGENCY DEPARTMENT ENCOUNTER      Pt Name: Diana [name]  MRN: [id number]  [place] 12/02/1945  Date of evaluation: 07/15/2022  Provider: Earnest Bailey, [place]       Chief Complaint   Patient presents with    Foot Pain     Pt c/o R foot pain with redness, itching and blister, states something bit her on R foot about 2 weeks ago.         HISTORY OF PRESENT ILLNESS    The history is provided by the patient and medical records. No language interpreter was used.     76 y.o. female has a past medical history of DDD, hypertension, hypothyroidism coming in with a chief complaint of redness and itchiness with blister formations for the past 2 weeks on her right foot.  She does not know what she came into contact with but she has been itching it and taking Benadryl.  She is concerned because she feels as if the redness is spreading.  She denies any fever/body/chills.  No significant tenderness no pain.  No history of diabetes, immunocompromise state    Nursing Notes were reviewed.    REVIEW OF SYSTEMS       Review of Systems   All other systems reviewed and are negative.      Except as noted above the remainder of the review of systems was reviewed and negative.       PAST MEDICAL HISTORY     Past Medical History:   Diagnosis Date    Anemia     Arthritis     Bleeding ulcer     DDD (degenerative disc disease), lumbar     History of blood transfusion     Hypertension     Hypothyroidism     Osteoporosis     Scoliosis        SURGICAL HISTORY       Past Surgical History:   Procedure Laterality Date    JOINT REPLACEMENT Left 08/2014    KNEE ARTHROSCOPY Left 2010    NOSE SURGERY      RESET AFTER BEING BROKEN    TOTAL HIP ARTHROPLASTY  08/23/2015    TOTAL KNEE ARTHROPLASTY Right 04/2018    TUBAL LIGATION         CURRENT MEDICATIONS       Discharge Medication List as of 07/15/2022 10:31 AM        CONTINUE these medications which have NOT CHANGED    Details   pramipexole (MIRAPEX) 1 MG tablet Take  1.5 tablets by mouth every evening 1.5 TABLETS AT BEDTIME ORALLY 90 DAYS, Disp-135 tablet, R-3Normal      magnesium oxide (MAG-OX) 140 MG CAPS 500 mgHistorical Med      vitamin D-3 (CHOLECALCIFEROL) 125 MCG (5000 UT) TABS 5,000 Int'l UnitsHistorical Med      Bacillus Coagulans-Inulin (PROBIOTIC FORMULA) 1-250 BILLION-MG CAPS Historical Med      acetaminophen (TYLENOL) 650 MG extended release tablet Historical Med      zoster recombinant adjuvanted vaccine (SHINGRIX) 50 MCG/0.5ML SUSR injection Historical Med      RA Vitamin E-Vit A & D 20000 units CREA 1 tablet Orally Twice a dayHistorical Med      traZODone (DESYREL) 100 MG tablet TAKE 2 TABLETS BY MOUTH EVERY DAY AT BEDTIMEHistorical Med      omeprazole (PRILOSEC) 40 MG delayed release capsule 1 capsuleHistorical Med  ALLERGIES     Amitriptyline hcl, Aspirin, Codeine, and Pseudoeph-bromphen-cod    FAMILY HISTORY       Family History   Problem Relation Age of Onset    Diabetes Mother     Stroke Mother     Rheum Arthritis Mother     Coronary Art Dis Mother     Heart Disease Father     Alzheimer's Disease Father     Coronary Art Dis Father     No Known Problems Brother     No Known Problems Daughter         SOCIAL HISTORY       Social History     Socioeconomic History    Marital status: Married   Tobacco Use    Smoking status: Never    Smokeless tobacco: Never   Vaping Use    Vaping Use: Never used   Substance and Sexual Activity    Alcohol use: Yes    Drug use: Never    Sexual activity: Not Currently     Partners: Male     Social Determinants of Health     Physical Activity: Insufficiently Active (02/23/2022)    Exercise Vital Sign     Days of Exercise per Week: 1 day     Minutes of Exercise per Session: 30 min         PHYSICAL EXAM       ED Triage Vitals [07/15/22 0924]   BP Temp Temp Source Pulse Respirations SpO2 Height Weight - Scale   (!) 141/79 97.9 F (36.6 C) Oral 59 16 99 % 1.651 m (_0 ) 53.5 kg (118 lb)       Physical Exam  Vitals and nursing  note reviewed.   Constitutional:       Appearance: Normal appearance.   HENT:      Head: Normocephalic and atraumatic.   Eyes:      Conjunctiva/sclera: Conjunctivae normal.   Cardiovascular:      Rate and Rhythm: Normal rate.   Pulmonary:      Effort: Pulmonary effort is normal.   Skin:     General: Skin is warm and dry.      Comments: Dorsum of the right foot is erythematous with no clear induration, fluid collection, crepitus, streaking with blister formations on the third phalanx   Neurological:      Mental Status: She is alert and oriented to person, place, and time.   Psychiatric:         Mood and Affect: Mood normal.         Behavior: Behavior normal.         DIAGNOSTIC RESULTS   PROCEDURES:  Unless otherwise noted below, none     Procedures    EKG: All EKG's are interpreted by the Emergency Department Physician who either signs or Co-signs this chart in the absence of a cardiologist.    RADIOLOGY:   Non-plain film images such as CT, Ultrasound and MRI are read by the radiologist. Plain radiographic images are visualized and preliminarily interpreted by the emergency physician with the below findings:    Interpretation per the Radiologist below, if available at the time of this note:    XR FOOT RIGHT (MIN 3 VIEWS)   Final Result   Impression: Normal foot radiographs. No soft tissue swelling. No subcutaneous    gas or radiopaque foreign body.          LABS:  Labs Reviewed - No data to display  All other labs were within normal range or not returned as of this dictation.    EMERGENCY DEPARTMENT COURSE/REASSESSMENT and MDM:   Vitals:    Vitals:    07/15/22 0924 07/15/22 1030   BP: (!) 141/79    Pulse: 59    Resp: 16    Temp: 97.9 F (36.6 C)    TempSrc: Oral    SpO2: 99% 99%   Weight: 53.5 kg (118 lb)    Height: 1.651 m (_0 )        Provider evaluation time: 07/15/22 0917    ED Course:    76 y.o. female has a past medical history of DDD, hypertension, hypothyroidism coming in with a chief complaint of  redness and itchiness with blister formations for the past 2 weeks on her right foot.      Differential diagnosis: Diagnosis includes but is not limited to allergic reaction, urticaria, anaphylaxis, eczema, contact dermatitis, eczema, fungal infection, cellulitis, abscess.           Reevaluation: Patient has clinical signs/symptoms of contact dermatitis but I would like to cover her with antibiotics as well.  I have discussed this case with Dr. Purvis Kilts who will follow-up with the patient's care on Monday and who is in agreement with this plan of treating for both allergy and infection at this time. The patient is reassured that these symptoms do not appear to represent a serious or threatening condition.  Vital signs stable patient no acute distress upon discharge.  Strict return precautions of any new or worsening symptoms were given.       Medical Decision Making  Amount and/or Complexity of Data Reviewed  Radiology: ordered.    Risk  Prescription drug management.         CONSULTS:  None    FINAL IMPRESSION      1. Cellulitis, unspecified cellulitis site    2. Allergic contact dermatitis, unspecified trigger          DISPOSITION/PLAN   DISPOSITION Decision To Discharge 07/15/2022 10:23:20 AM      PATIENT REFERRED TO:  Baldo Daub, DPM  [address] [place] [address]  [phone]    Call   Call on Monday morning to schedule an appointment for either Monday and [place] or Tuesday in [place]:  Discharge Medication List as of 07/15/2022 10:31 AM        START taking these medications    Details   methylPREDNISolone (MEDROL, PAK,) 4 MG tablet Take by mouth., Disp-1 kit, R-0Normal      cephALEXin (KEFLEX) 500 MG capsule Take 1 capsule by mouth 3 times daily for 7 days, Disp-21 capsule, R-0Normal             Controlled Substances Monitoring:          No data to display                (Please note that portions of this note were completed with a voice recognition  program.  Efforts were made to edit the dictations but occasionally words are mis-transcribed.)    Earnest Bailey, PA-C (electronically signed)  Emergency Physician Assistant PA-C           Earnest Bailey, PA-C  07/15/22 1654

## 2022-07-19 ENCOUNTER — Ambulatory Visit: Admit: 2022-07-19 | Discharge: 2022-07-19 | Payer: MEDICARE | Attending: Foot Surgery | Primary: Internal Medicine

## 2022-07-19 DIAGNOSIS — L03031 Cellulitis of right toe: Secondary | ICD-10-CM

## 2022-07-19 MED ORDER — CEPHALEXIN 500 MG PO CAPS
500 MG | ORAL_CAPSULE | Freq: Three times a day (TID) | ORAL | 0 refills | Status: AC
Start: 2022-07-19 — End: 2023-02-27

## 2022-07-19 MED ORDER — CLOTRIMAZOLE-BETAMETHASONE 1-0.05 % EX CREA
CUTANEOUS | 1 refills | Status: AC
Start: 2022-07-19 — End: 2023-02-27

## 2022-07-19 NOTE — Progress Notes (Signed)
07/19/22   Diana [name]  June 20, 1946  76 y.o.    SUBJECTIVE     Chief Complaint   Patient presents with    Follow-up     ED  follow up 07/15/22,Right foot cellulitis        HPI   Patient is seen for follow-up from the emergency department on 07/15/2022.  She states that she started developing redness and swelling and pain in her right forefoot.    She states that this began after she developed a blister on her toe.  She is unsure what caused the blister.  She does relate a history of a fractured right fourth toe which she was treating with buddy splinting with taped to the adjacent third toe.    ROS   Review of Systems   Constitutional: Negative.    HENT: Negative.     Respiratory: Negative.     Cardiovascular: Negative.    Gastrointestinal: Negative.         MEDICAL HISTORY   Past Medical History:   Diagnosis Date    Anemia     Arthritis     Bleeding ulcer     DDD (degenerative disc disease), lumbar     History of blood transfusion     Hypertension     Hypothyroidism     Osteoporosis     Scoliosis       Social History     Socioeconomic History    Marital status: Married     Spouse name: Not on file    Number of children: Not on file    Years of education: Not on file    Highest education level: Not on file   Occupational History    Not on file   Tobacco Use    Smoking status: Never    Smokeless tobacco: Never   Vaping Use    Vaping Use: Never used   Substance and Sexual Activity    Alcohol use: Yes    Drug use: Never    Sexual activity: Not Currently     Partners: Male   Other Topics Concern    Not on file   Social History Narrative    Not on file     Social Determinants of Health     Financial Resource Strain: Not on file   Food Insecurity: Not on file   Transportation Needs: Not on file   Physical Activity: Insufficiently Active (02/23/2022)    Exercise Vital Sign     Days of Exercise per Week: 1 day     Minutes of Exercise per Session: 30 min   Stress: Not on file   Social Connections: Not on file   Intimate  Partner Violence: Not on file   Housing Stability: Not on file      Family History   Problem Relation Age of Onset    Diabetes Mother     Stroke Mother     Rheum Arthritis Mother     Coronary Art Dis Mother     Heart Disease Father     Alzheimer's Disease Father     Coronary Art Dis Father     No Known Problems Brother     No Known Problems Daughter         Outpatient Encounter Medications as of 07/19/2022   Medication Sig Dispense Refill    vitamin B-6 (PYRIDOXINE) 100 MG tablet Take 1 tablet by mouth daily      vitamin B-12 (CYANOCOBALAMIN) 1000 MCG tablet  Take 1 tablet by mouth daily      clotrimazole-betamethasone (LOTRISONE) 1-0.05 % cream Apply topically 2 times daily to affected area. 45 g 1    cephALEXin (KEFLEX) 500 MG capsule Take 1 capsule by mouth 3 times daily 30 capsule 0    methylPREDNISolone (MEDROL, PAK,) 4 MG tablet Take by mouth. 1 kit 0    cephALEXin (KEFLEX) 500 MG capsule Take 1 capsule by mouth 3 times daily for 7 days 21 capsule 0    pramipexole (MIRAPEX) 1 MG tablet Take 1.5 tablets by mouth every evening 1.5 TABLETS AT BEDTIME ORALLY 90 DAYS 135 tablet 3    magnesium oxide (MAG-OX) 140 MG CAPS 500 mg      vitamin D-3 (CHOLECALCIFEROL) 125 MCG (5000 UT) TABS 5,000 Int'l Units      Bacillus Coagulans-Inulin (PROBIOTIC FORMULA) 1-250 BILLION-MG CAPS       acetaminophen (TYLENOL) 650 MG extended release tablet       traZODone (DESYREL) 100 MG tablet TAKE 2 TABLETS BY MOUTH EVERY DAY AT BEDTIME      zoster recombinant adjuvanted vaccine [facility]) 50 MCG/0.5ML SUSR injection       RA Vitamin E-Vit A & D 20000 units CREA 1 tablet Orally Twice a day (Patient not taking: Reported on 07/19/2022)      omeprazole (PRILOSEC) 40 MG delayed release capsule 1 capsule (Patient not taking: Reported on 07/19/2022)       No facility-administered encounter medications on file as of 07/19/2022.      Allergies   Allergen Reactions    Amitriptyline Hcl      Other reaction(s): sleep walking    Aspirin      Other  reaction(s): bleeding ulcers    Codeine      Other reaction(s): Unknown    Pseudoeph-Bromphen-Cod      Other reaction(s): Hallucinations      Ht 1.651 m (_0 )   Wt 53.5 kg (117 lb 15.1 oz)   BMI 19.63 kg/m     OBJECTIVE      EXAMINATION    OBJECTIVE  VASCULAR: DP and PT pulses are palpable 2/4 b/l. CFT is brisk to the digits of the foot b/l. Skin temperature is warm to cool from proximal to distal with no focal calor noted.     NEUROLOGIC: Gross and epicritic sensation is intact b/l. Protective sensation is appreciated at all pedal sites b/l.    DERMATOLOGIC: Skin is of normal texture and turgor of both feet.  Peers to have tinea in the second and third interspace.  A bulla is present on the third toe.  Very slight edema is noted of the right forefoot with some erythema primarily around the second MTP.    MUSCULOSKELETAL: Muscle strength is 5/5 for all pedal groups tested. No pain with palpation of the foot or ankle b/l. Ankle joint and Subtalar joint ROM is within normal limits. No obvious biomechanical abnormalities.       COGNITIVE  PATIENT IS ALERT AND ORIENTED X3      ASSESSMENT:     Visit Diagnoses         Codes    Cellulitis of toe of right foot    -  Primary L03.031    Tinea pedis of right foot     B35.3    Bulla     R23.8                PLAN:   I discussed the patient's tinea and prescribed  Lotrisone.    She has 2 days remaining of her Keflex and she still has a bit of cellulitis present.  I have recommended she continue taking Keflex and I ordered an additional course.    I discussed that this was likely caused by the toe strapping which may have caused moisture buildup and a fungal infection versus a contact dermatitis.  Either of these would have resulted in erosion of the epidermis allowing a bacterial cellulitis.    The patient should follow-up in our office in 2 weeks as needed.  This should resolve uneventfully.  I answered her questions.    She had some questions about varicosities and I  referred her to her vascular specialist, Dr. Alferd Patee.    Additionally she has some questions about her right fifth hammertoe and I discussed conservative versus surgical treatment options.      FOLLOW UP    Return in about 2 weeks (around 08/02/2022), or if symptoms worsen or fail to improve, for Cellulitis foot right.    Baldo Daub, DPM

## 2022-07-22 ENCOUNTER — Ambulatory Visit
Admission: RE | Admit: 2022-07-22 | Discharge: 2022-07-22 | Disposition: A | Discharge status: Home / Self Care | Payer: Medicare Other | Source: Ambulatory Visit | Attending: Internal Medicine | Admitting: Internal Medicine

## 2022-07-22 DIAGNOSIS — Z1231 Encounter for screening mammogram for malignant neoplasm of breast: Secondary | ICD-10-CM | POA: Diagnosis not present

## 2022-08-21 DIAGNOSIS — H04123 Dry eye syndrome of bilateral lacrimal glands: Secondary | ICD-10-CM | POA: Diagnosis not present

## 2022-08-21 DIAGNOSIS — H524 Presbyopia: Secondary | ICD-10-CM | POA: Diagnosis not present

## 2022-09-09 NOTE — Telephone Encounter (Signed)
Please remind her to get her labs done to recheck her parathyroid. She did not read mychart

## 2022-09-11 ENCOUNTER — Emergency Department: Admit: 2022-09-12 | Payer: MEDICARE | Primary: Internal Medicine

## 2022-09-11 DIAGNOSIS — L0390 Cellulitis, unspecified: Secondary | ICD-10-CM

## 2022-09-11 DIAGNOSIS — L03115 Cellulitis of right lower limb: Secondary | ICD-10-CM

## 2022-09-11 NOTE — ED Provider Notes (Signed)
[facility] DE[facility]  EMERGENCY DEPARTMENT ENCOUNTER      Pt Name: Diana [name]  MRN: [id number]  [place] 12/11/1945  Date of evaluation: 09/11/2022  Provider: Clarene Reamer, MD    [place]       Chief Complaint   Patient presents with    Foot Pain     Pt states she developed itching on R foot and noticed blister on 2nd toe of R foot with redness.         HISTORY OF PRESENT ILLNESS    77 year old female presents to the emergency room with redness noted to the right foot.  She notes that began 2 days ago with some itching.  She notes that a similar episode occurred approximately 2 months ago and she was diagnosed with a fungal infection and cellulitis.  Patient was seen by podiatrist, Dr. Gunnar Bulla.  She was prescribed Keflex and a cream.  Patient notes that she did start the cream again 2 days ago.  No fever.  No known injury.  Patient denies history of diabetes.  Patient reports that she developed a blister this morning.              Nursing Notes were reviewed.  REVIEW OF SYSTEMS     Review of Systems   Constitutional:  Negative for fever.       Except as noted above the remainder of the review of systems was reviewed and negative.     PAST MEDICAL HISTORY     Past Medical History:   Diagnosis Date    Anemia     Arthritis     Bleeding ulcer     DDD (degenerative disc disease), lumbar     History of blood transfusion     Hypertension     Hypothyroidism     Osteoporosis     Scoliosis        SURGICAL HISTORY       Past Surgical History:   Procedure Laterality Date    JOINT REPLACEMENT Left 08/2014    KNEE ARTHROSCOPY Left 2010    NOSE SURGERY      RESET AFTER BEING BROKEN    TOTAL HIP ARTHROPLASTY  08/23/2015    TOTAL KNEE ARTHROPLASTY Right 04/2018    TUBAL LIGATION         CURRENT MEDICATIONS       Previous Medications    ACETAMINOPHEN (TYLENOL) 650 MG EXTENDED RELEASE TABLET        BACILLUS COAGULANS-INULIN (PROBIOTIC FORMULA) 1-250 BILLION-MG CAPS        CEPHALEXIN (KEFLEX) 500 MG CAPSULE    Take 1 capsule by  mouth 3 times daily    CLOTRIMAZOLE-BETAMETHASONE (LOTRISONE) 1-0.05 % CREAM    Apply topically 2 times daily to affected area.    MAGNESIUM OXIDE (MAG-OX) 140 MG CAPS    500 mg    OMEPRAZOLE (PRILOSEC) 40 MG DELAYED RELEASE CAPSULE    1 capsule    PRAMIPEXOLE (MIRAPEX) 1 MG TABLET    Take 1.5 tablets by mouth every evening 1.5 TABLETS AT BEDTIME ORALLY 90 DAYS    RA VITAMIN E-VIT A & D 20000 UNITS CREA    1 tablet Orally Twice a day    TRAZODONE (DESYREL) 100 MG TABLET    TAKE 2 TABLETS BY MOUTH EVERY DAY AT BEDTIME    VITAMIN B-12 (CYANOCOBALAMIN) 1000 MCG TABLET    Take 1 tablet by mouth daily    VITAMIN B-6 (PYRIDOXINE) 100 MG TABLET  Take 1 tablet by mouth daily    VITAMIN D-3 (CHOLECALCIFEROL) 125 MCG (5000 UT) TABS    5,000 Int'l Units    ZOSTER RECOMBINANT ADJUVANTED VACCINE (SHINGRIX) 50 MCG/0.5ML SUSR INJECTION           ALLERGIES     Amitriptyline hcl, Aspirin, Codeine, and Pseudoeph-bromphen-cod    FAMILY HISTORY       Family History   Problem Relation Age of Onset    Diabetes Mother     Stroke Mother     Rheum Arthritis Mother     Coronary Art Dis Mother     Heart Disease Father     Alzheimer's Disease Father     Coronary Art Dis Father     No Known Problems Brother     No Known Problems Daughter         SOCIAL HISTORY       Social History     Socioeconomic History    Marital status: Married   Tobacco Use    Smoking status: Never    Smokeless tobacco: Never   Vaping Use    Vaping Use: Never used   Substance and Sexual Activity    Alcohol use: Yes    Drug use: Never    Sexual activity: Not Currently     Partners: Male     Social Determinants of Health     Physical Activity: Insufficiently Active (02/23/2022)    Exercise Vital Sign     Days of Exercise per Week: 1 day     Minutes of Exercise per Session: 30 min       SCREENINGS         Glasgow Coma Scale  Eye Opening: Spontaneous  Best Verbal Response: Oriented  Best Motor Response: Obeys commands  Glasgow Coma Scale Score: 15                     CIWA  Assessment  BP: 129/70  Pulse: 79                 PHYSICAL EXAM         ED Triage Vitals [09/11/22 1935]   BP Temp Temp src Pulse Respirations SpO2 Height Weight - Scale   (!) 152/83 97.9 F (36.6 C) -- 79 16 100 % 1.651 m (5\' 5" ) 53.5 kg (118 lb)       Physical Exam  Vitals and nursing note reviewed.   Constitutional:       Appearance: Normal appearance.   HENT:      Head: Normocephalic and atraumatic.   Pulmonary:      Effort: Pulmonary effort is normal.   Musculoskeletal:         General: Normal range of motion.      Cervical back: Normal range of motion and neck supple.      Comments: RIGHT FOOT: Erythema noted to the dorsal aspect of the right foot, +2 DP pulse, distal motor/sensory intact, small intact blister noted to the mid toe.   Skin:     General: Skin is warm and dry.   Neurological:      General: No focal deficit present.      Mental Status: She is alert.   Psychiatric:         Mood and Affect: Mood normal.         Behavior: Behavior normal.         PROCEDURES:  Unless otherwise noted below, none  Procedures    DIAGNOSTIC RESULTS   EKG: All EKG's are interpreted by the Emergency Department Physician who either signs or Co-signs this chart in the absence of a cardiologist.      RADIOLOGY:   Non-plain film images such as CT, Ultrasound and MRI are read by the radiologist. Plain radiographic images are visualized and preliminarily interpreted by the emergency physician with the below findings:    Interpretation per the Radiologist below, if available at the time of this note:    XR FOOT RIGHT (MIN 3 VIEWS)   Final Result      No fracture identified.        There may be some soft tissue swelling at the proximal aspect of the second toe.      Minimal degenerative changes are seen. No x-ray findings of osteomyelitis. No    soft tissue gas.          ED BEDSIDE ULTRASOUND:   Performed by ED Physician - none    LABS:  Labs Reviewed   CBC WITH AUTO DIFFERENTIAL - Abnormal; Notable for the following  components:       Result Value    RBC 3.51 (*)     Hemoglobin 10.8 (*)     Hematocrit 33.3 (*)     All other components within normal limits   BASIC METABOLIC PANEL - Abnormal; Notable for the following components:    Glucose 101 (*)     BUN 26 (*)     All other components within normal limits   LACTIC ACID       All other labs were within normal range or not returned as of this dictation.  EMERGENCY DEPARTMENT COURSE/REASSESSMENT and MDM:   Vitals:    Vitals:    09/11/22 1935 09/11/22 1937 09/11/22 1959   BP: (!) 152/83 (!) 142/78 129/70   Pulse: 79     Resp: 16     Temp: 97.9 F (36.6 C)     SpO2: 100% 100% 99%   Weight: 53.5 kg (118 lb)     Height: 1.651 m (5\' 5" )         ED Course:    ED Course as of 09/11/22 2015   Sun Sep 11, 2022   2015 Patient is in no acute distress.  Easy work of breathing.  I did advise PCP and podiatry follow-up. [JS]      ED Course User Index  [JS] Clarene Reamer, MD       MDM  Number of Diagnoses or Management Options  Cellulitis, unspecified cellulitis site  Diagnosis management comments: 77 year old female presents to the emergency room with likely cellulitis to the right foot.  She notes that it is itchy in nature.  There is possible fungal component to this.    No elevation in white blood cell count.  No elevation of lactic acid.  No fever.  I will treat the patient as cellulitis with fungal component.  Will give the patient Diflucan and doxycycline.  Will discharge and encourage PCP and podiatry follow-up.  x-ray of the right foot was unremarkable for acute process.       Amount and/or Complexity of Data Reviewed  Decide to obtain previous medical records or to obtain history from someone other than the patient: yes        CONSULTS:  None    FINAL IMPRESSION      1. Cellulitis, unspecified cellulitis site  DISPOSITION/PLAN   DISPOSITION        PATIENT REFERRED TO:  Nicholes Rough, MD  [address]  [address]  [place] [address]  [phone]    In 2  days      Horald Chestnut, [place]  [address]  [place] [address]  [phone]    In 2 days        DISCHARGE MEDICATIONS:  New Prescriptions    DOXYCYCLINE HYCLATE (VIBRAMYCIN) 100 MG CAPSULE    Take 1 capsule by mouth 2 times daily for 10 days    FLUCONAZOLE (DIFLUCAN) 150 MG TABLET    Take 1 tablet by mouth once for 1 dose Please take this tablet after completion of your antibiotic (doxycycline) therapy.              No data to display                (Please note that portions of this note were completed with a voice recognition program.  Efforts were made to edit the dictations but occasionally words are mis-transcribed.)    Hollace Kinnier, MD (electronically signed)  Attending Emergency Physician           Hollace Kinnier, MD  09/11/22 2015

## 2022-09-11 NOTE — Discharge Instructions (Addendum)
Please follow-up with your primary care physician in 2 to 3 days.  Please also follow-up with your podiatrist, Dr. Gunnar Bulla in 2 to 3 days.  Please return to the emergency department should your symptoms persist or worsen, develop fever, spreading redness to the foot, and/or develop any other concerns.

## 2022-09-12 ENCOUNTER — Inpatient Hospital Stay
Admit: 2022-09-12 | Discharge: 2022-09-12 | Disposition: A | Discharge status: Home / Self Care | Payer: MEDICARE | Attending: Emergency Medicine

## 2022-09-12 LAB — CBC WITH AUTO DIFFERENTIAL
Absolute Baso #: 0 10*3/uL (ref 0.0–0.2)
Absolute Eos #: 0.1 10*3/uL (ref 0.0–0.5)
Absolute Lymph #: 1.8 10*3/uL (ref 1.0–3.2)
Absolute Mono #: 0.7 10*3/uL (ref 0.3–1.0)
Basophils %: 0.3 % (ref 0.0–2.0)
Eosinophils %: 2.2 % (ref 0.0–7.0)
Hematocrit: 33.3 % — ABNORMAL LOW (ref 34.0–47.0)
Hemoglobin: 10.8 g/dL — ABNORMAL LOW (ref 11.5–15.7)
Immature Grans (Abs): 0.04 10*3/uL (ref 0.00–0.06)
Immature Granulocytes: 0.6 % (ref 0.0–0.6)
Lymphocytes: 28 % (ref 15.0–45.0)
MCH: 30.8 pg (ref 27.0–34.5)
MCHC: 32.4 g/dL (ref 32.0–36.0)
MCV: 94.9 fL (ref 81.0–99.0)
MPV: 8.6 fL (ref 7.2–13.2)
Monocytes: 10.4 % (ref 4.0–12.0)
Neutrophils %: 58.5 % (ref 42.0–74.0)
Neutrophils Absolute: 3.8 10*3/uL (ref 1.6–7.3)
Platelets: 278 10*3/uL (ref 140–440)
RBC: 3.51 x10e6/mcL — ABNORMAL LOW (ref 3.60–5.20)
RDW: 14.2 % (ref 11.0–16.0)
WBC: 6.5 10*3/uL (ref 3.8–10.6)

## 2022-09-12 LAB — BASIC METABOLIC PANEL
Anion Gap: 12 mmol/L (ref 2–17)
BUN: 26 mg/dL — ABNORMAL HIGH (ref 8–23)
CO2: 25 mmol/L (ref 22–29)
Calcium: 9.1 mg/dL (ref 8.8–10.2)
Chloride: 103 mmol/L (ref 98–107)
Creatinine: 0.6 mg/dL (ref 0.5–1.0)
Est, Glom Filt Rate: 92 mL/min/1.73m (ref >=60)
Glucose: 101 mg/dL — ABNORMAL HIGH (ref 70–99)
OSMOLALITY CALCULATED: 284 mOsm/kg (ref 270–287)
Potassium: 3.8 mmol/L (ref 3.5–5.3)
Sodium: 140 mmol/L (ref 135–145)

## 2022-09-12 LAB — LACTIC ACID: Lactic Acid: 0.5 mmol/L (ref 0.5–2.0)

## 2022-09-12 MED ORDER — FLUCONAZOLE 150 MG PO TABS
150 MG | ORAL_TABLET | Freq: Once | ORAL | 0 refills | Status: AC
Start: 2022-09-12 — End: 2022-09-11

## 2022-09-12 MED ORDER — FLUCONAZOLE 100 MG PO TABS
100 MG | Freq: Once | ORAL | Status: AC
Start: 2022-09-12 — End: 2022-09-11
  Administered 2022-09-12: 01:00:00 150 mg via ORAL

## 2022-09-12 MED ORDER — DOXYCYCLINE HYCLATE 100 MG PO CAPS
100 MG | ORAL_CAPSULE | Freq: Two times a day (BID) | ORAL | 0 refills | Status: AC
Start: 2022-09-12 — End: 2022-09-21

## 2022-09-12 MED ORDER — DOXYCYCLINE HYCLATE 100 MG PO CAPS
100 MG | Freq: Once | ORAL | Status: AC
Start: 2022-09-12 — End: 2022-09-11
  Administered 2022-09-12: 01:00:00 100 mg via ORAL

## 2022-09-12 MED FILL — DOXYCYCLINE HYCLATE 100 MG PO CAPS: 100 MG | ORAL | Qty: 1

## 2022-09-12 MED FILL — FLUCONAZOLE 100 MG PO TABS: 100 MG | ORAL | Qty: 2

## 2022-09-19 ENCOUNTER — Encounter

## 2022-09-20 LAB — PTH, INTACT: Pth Intact: 64.5 pg/mL (ref 15.00–65.00)

## 2022-09-20 LAB — CALCIUM IONIZED SERUM: Calcium, Ionized: 4.9 mg/dL (ref 4.5–5.3)

## 2022-09-20 LAB — VITAMIN D 25 HYDROXY: Vit D, 25-Hydroxy: 63.9 ng/mL (ref 30.0–90.0)

## 2022-09-21 NOTE — Telephone Encounter (Signed)
Labs are done

## 2022-09-25 LAB — PTH-RELATED PEPTIDE: PTH Related Peptide: <2 pmol/L

## 2022-09-26 NOTE — Other (Signed)
All osteoporosis labs were normal.     Thanks,   Katy Danuel Felicetti, PA-C  [facility] Director

## 2022-10-09 ENCOUNTER — Encounter (HOSPITAL_COMMUNITY): Payer: Self-pay | Admitting: Emergency Medicine

## 2022-10-09 ENCOUNTER — Other Ambulatory Visit: Payer: Self-pay

## 2022-10-09 ENCOUNTER — Emergency Department (HOSPITAL_COMMUNITY)
Admission: EM | Admit: 2022-10-09 | Discharge: 2022-10-10 | Disposition: A | Discharge status: Other Acute Inpatient Hospital | Payer: Medicare Other | Attending: Emergency Medicine | Admitting: Emergency Medicine

## 2022-10-09 DIAGNOSIS — T1491XA Suicide attempt, initial encounter: Secondary | ICD-10-CM | POA: Diagnosis present

## 2022-10-09 DIAGNOSIS — F32A Depression, unspecified: Secondary | ICD-10-CM

## 2022-10-09 DIAGNOSIS — I10 Essential (primary) hypertension: Secondary | ICD-10-CM | POA: Diagnosis not present

## 2022-10-09 DIAGNOSIS — F332 Major depressive disorder, recurrent severe without psychotic features: Secondary | ICD-10-CM | POA: Diagnosis present

## 2022-10-09 DIAGNOSIS — X838XXA Intentional self-harm by other specified means, initial encounter: Secondary | ICD-10-CM | POA: Diagnosis not present

## 2022-10-09 DIAGNOSIS — E871 Hypo-osmolality and hyponatremia: Secondary | ICD-10-CM | POA: Insufficient documentation

## 2022-10-09 DIAGNOSIS — R45851 Suicidal ideations: Secondary | ICD-10-CM

## 2022-10-09 DIAGNOSIS — Z79899 Other long term (current) drug therapy: Secondary | ICD-10-CM | POA: Diagnosis not present

## 2022-10-09 DIAGNOSIS — Z1152 Encounter for screening for COVID-19: Secondary | ICD-10-CM | POA: Insufficient documentation

## 2022-10-09 DIAGNOSIS — F331 Major depressive disorder, recurrent, moderate: Secondary | ICD-10-CM | POA: Insufficient documentation

## 2022-10-09 LAB — CBC
HCT: 38.4 % (ref 36.0–46.0)
Hemoglobin: 12.8 g/dL (ref 12.0–15.0)
MCH: 31.5 pg (ref 26.0–34.0)
MCHC: 33.3 g/dL (ref 30.0–36.0)
MCV: 94.6 fL (ref 80.0–100.0)
Platelets: 243 10*3/uL (ref 150–400)
RBC: 4.06 MIL/uL (ref 3.87–5.11)
RDW: 12.5 % (ref 11.5–15.5)
WBC: 7.2 10*3/uL (ref 4.0–10.5)
nRBC: 0 % (ref 0.0–0.2)

## 2022-10-09 LAB — COMPREHENSIVE METABOLIC PANEL
ALT: 15 U/L (ref 0–44)
AST: 21 U/L (ref 15–41)
Albumin: 4.2 g/dL (ref 3.5–5.0)
Alkaline Phosphatase: 53 U/L (ref 38–126)
Anion gap: 10 (ref 5–15)
BUN: 13 mg/dL (ref 8–23)
CO2: 23 mmol/L (ref 22–32)
Calcium: 9.3 mg/dL (ref 8.9–10.3)
Chloride: 100 mmol/L (ref 98–111)
Creatinine, Ser: 0.59 mg/dL (ref 0.44–1.00)
GFR, Estimated: >60 mL/min (ref >60)
Glucose, Bld: 88 mg/dL (ref 70–99)
Potassium: 4 mmol/L (ref 3.5–5.1)
Sodium: 133 mmol/L — ABNORMAL LOW (ref 135–145)
Total Bilirubin: 0.6 mg/dL (ref 0.3–1.2)
Total Protein: 7.2 g/dL (ref 6.5–8.1)

## 2022-10-09 LAB — RAPID URINE DRUG SCREEN, HOSP PERFORMED
Amphetamines: NOT DETECTED
Barbiturates: NOT DETECTED
Benzodiazepines: NOT DETECTED
Cocaine: NOT DETECTED
Opiates: NOT DETECTED
Tetrahydrocannabinol: NOT DETECTED

## 2022-10-09 LAB — ACETAMINOPHEN LEVEL: Acetaminophen (Tylenol), Serum: <10 ug/mL — ABNORMAL LOW (ref 10–30)

## 2022-10-09 LAB — ETHANOL: Alcohol, Ethyl (B): <10 mg/dL (ref <10)

## 2022-10-09 LAB — SALICYLATE LEVEL: Salicylate Lvl: <7 mg/dL — ABNORMAL LOW (ref 7.0–30.0)

## 2022-10-09 MED ORDER — CLONAZEPAM 0.5 MG PO TABS
0.5000 mg | ORAL_TABLET | Freq: Two times a day (BID) | ORAL | Status: DC
  Administered 2022-10-09 – 2022-10-10 (×2): 0.5 mg via ORAL
  Filled 2022-10-09 (×2): qty 1

## 2022-10-09 MED ORDER — FLUOXETINE HCL 20 MG PO CAPS
20.0000 mg | ORAL_CAPSULE | ORAL | Status: DC
  Administered 2022-10-10: 20 mg via ORAL
  Filled 2022-10-09: qty 1

## 2022-10-09 MED ORDER — FLUOXETINE HCL 20 MG PO CAPS: 40.0000 mg | ORAL_CAPSULE | ORAL | Status: DC

## 2022-10-09 MED ORDER — BUPROPION HCL ER (XL) 150 MG PO TB24
300.0000 mg | ORAL_TABLET | Freq: Every day | ORAL | Status: DC
  Administered 2022-10-10: 300 mg via ORAL
  Filled 2022-10-09: qty 2

## 2022-10-09 MED ORDER — BUPROPION HCL ER (XL) 150 MG PO TB24: 300.0000 mg | ORAL_TABLET | Freq: Every day | ORAL | Status: DC

## 2022-10-09 MED ORDER — LISINOPRIL 10 MG PO TABS
10.0000 mg | ORAL_TABLET | Freq: Every day | ORAL | Status: DC
  Administered 2022-10-10: 10 mg via ORAL
  Filled 2022-10-09: qty 1

## 2022-10-09 MED ORDER — MIRTAZAPINE 7.5 MG PO TABS
15.0000 mg | ORAL_TABLET | Freq: Every day | ORAL | Status: DC
  Administered 2022-10-09: 15 mg via ORAL
  Filled 2022-10-09: qty 2
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
## 2022-10-09 NOTE — ED Provider Notes (Signed)
[place] EMERGENCY DEPARTMENT AT [facility] Provider Note   CSN: [id number] Arrival date & time: 10/09/22  1533     History  Chief Complaint  Patient presents with   Suicidal    Caitlyn [name] is a 77 y.o. female.  Patient presents to the emergency department with family today for increasing feelings of depression and suicidal ideations.  Patient has a history of depression.  Recently increased dose of Wellbutrin about 2 weeks ago.  Patient has not noted a change in the way that she feels yet.  There are some medications which she has been taking intermittently per family history.  Patient denies any recent medical illnesses.  Around the first of the year she had a respiratory virus that resolved and she is not having any recent fevers or trouble breathing.  She has had made some suicidal gestures and comments at home.       Home Medications Prior to Admission medications   Medication Sig Start Date End Date Taking? Authorizing Provider  ARIPiprazole (ABILIFY) 5 MG tablet Take 5 mg by mouth daily.    [provider]  clonazePAM (KLONOPIN) 1 MG tablet Take 1 mg by mouth 2 (two) times daily.    [provider]  cycloSPORINE (RESTASIS) 0.05 % ophthalmic emulsion Place 1 drop into both eyes daily.    [provider]  FLUoxetine (PROZAC) 20 MG capsule Take 20-40 mg by mouth See admin instructions. Takes 20 mg and 40 mg on alternate days.    [provider]  lisinopril (ZESTRIL) 10 MG tablet Take 10 mg by mouth daily.    [provider]  loperamide (IMODIUM A-D) 2 MG tablet 1 tablet as needed 11/24/21   [provider]  mirtazapine (REMERON) 15 MG tablet Take 15 mg by mouth at bedtime.    [provider]      Allergies    Penicillins    Review of Systems   Review of Systems  Physical Exam Updated Vital Signs BP (!) 180/95   Pulse (!) 116   Temp 98.6 F (37 C)   Resp 18   SpO2 97%  Physical Exam Vitals  and nursing note reviewed.  Constitutional:      General: She is not in acute distress.    Appearance: She is well-developed.  HENT:     Head: Normocephalic and atraumatic.     Right Ear: External ear normal.     Left Ear: External ear normal.     Nose: Nose normal.  Eyes:     Conjunctiva/sclera: Conjunctivae normal.  Cardiovascular:     Rate and Rhythm: Normal rate and regular rhythm.     Heart sounds: No murmur heard. Pulmonary:     Effort: No respiratory distress.     Breath sounds: No wheezing, rhonchi or rales.  Abdominal:     Palpations: Abdomen is soft.     Tenderness: There is no abdominal tenderness. There is no guarding or rebound.  Musculoskeletal:     Cervical back: Normal range of motion and neck supple.     Right lower leg: No edema.     Left lower leg: No edema.  Skin:    General: Skin is warm and dry.     Findings: No rash.  Neurological:     General: No focal deficit present.     Mental Status: She is alert. Mental status is at baseline.     Motor: No weakness.  Psychiatric:  Mood and Affect: Mood normal.     ED Results / Procedures / Treatments   Labs (all labs ordered are listed, but only abnormal results are displayed) Labs Reviewed  COMPREHENSIVE METABOLIC PANEL - Abnormal; Notable for the following components:      Result Value   Sodium 133 (*)    All other components within normal limits  SALICYLATE LEVEL - Abnormal; Notable for the following components:   Salicylate Lvl [id number] (*)    All other components within normal limits  ACETAMINOPHEN LEVEL - Abnormal; Notable for the following components:   Acetaminophen (Tylenol), Serum <10 (*)    All other components within normal limits  ETHANOL  CBC  RAPID URINE DRUG SCREEN, HOSP PERFORMED    ED ECG REPORT   Date: 10/09/2022  Rate: 61  Rhythm: normal sinus rhythm, PVC  QRS Axis: normal  Intervals: normal  ST/T Wave abnormalities: normal  Conduction Disutrbances:none  Narrative  Interpretation:   Old EKG Reviewed: unchanged from 12/2021  I have personally reviewed the EKG tracing and agree with the computerized printout as noted.   Radiology No results found.  Procedures Procedures    Medications Ordered in ED Medications - No data to display  ED Course/ Medical Decision Making/ A&P    Patient seen and examined. History obtained directly from patient.   Labs/EKG: Ordered .  Imaging: Ordered CBC unremarkable; CMP minimal hyponatremia at 133 otherwise unremarkable; ethanol negative; salicylate and acetaminophen level negative.  EKG personally reviewed and interpreted as above, tachycardia resolved.  Medications/Fluids: Ordered: None ordered  Most recent vital signs reviewed and are as follows: BP (!) 180/95   Pulse (!) 116   Temp 98.6 F (37 C)   Resp 18   SpO2 97%   Initial impression: Depression, suicidal ideations  Patient is medically cleared.  Awaiting TTS evaluation.                               Medical Decision Making Amount and/or Complexity of Data Reviewed Labs: ordered.           Final Clinical Impression(s) / ED Diagnoses Final diagnoses:  Suicidal ideation  Depression, unspecified depression type    Rx / DC Orders ED Discharge Orders     None         Carlisle Cater, PA-C 10/09/22 1728    Regan Lemming, MD 10/10/22 [phone]
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
## 2022-10-09 NOTE — ED Notes (Signed)
Family members Georgina Peer or [place] requesting to be notified prior to patient placement to a facility.

## 2022-10-09 NOTE — ED Notes (Signed)
Patients daughter in law is taking patients belongings home. Including patients clothing, shoes, wedding ring, etc.

## 2022-10-10 ENCOUNTER — Other Ambulatory Visit: Payer: Self-pay

## 2022-10-10 ENCOUNTER — Inpatient Hospital Stay
Admission: AD | Admit: 2022-10-10 | Discharge: 2022-11-02 | DRG: 885 | Disposition: A | Discharge status: Home / Self Care | Payer: Medicare Other | Source: Intra-hospital | Attending: Psychiatry | Admitting: Psychiatry

## 2022-10-10 ENCOUNTER — Encounter: Payer: Self-pay | Admitting: Psychiatry

## 2022-10-10 DIAGNOSIS — G4700 Insomnia, unspecified: Secondary | ICD-10-CM | POA: Diagnosis present

## 2022-10-10 DIAGNOSIS — R45851 Suicidal ideations: Secondary | ICD-10-CM | POA: Diagnosis present

## 2022-10-10 DIAGNOSIS — F331 Major depressive disorder, recurrent, moderate: Secondary | ICD-10-CM | POA: Diagnosis not present

## 2022-10-10 DIAGNOSIS — T1491XA Suicide attempt, initial encounter: Secondary | ICD-10-CM

## 2022-10-10 DIAGNOSIS — F411 Generalized anxiety disorder: Secondary | ICD-10-CM | POA: Diagnosis present

## 2022-10-10 DIAGNOSIS — Z20822 Contact with and (suspected) exposure to covid-19: Secondary | ICD-10-CM | POA: Diagnosis present

## 2022-10-10 DIAGNOSIS — F332 Major depressive disorder, recurrent severe without psychotic features: Secondary | ICD-10-CM | POA: Diagnosis not present

## 2022-10-10 DIAGNOSIS — F32A Depression, unspecified: Secondary | ICD-10-CM | POA: Diagnosis not present

## 2022-10-10 DIAGNOSIS — Z1152 Encounter for screening for COVID-19: Secondary | ICD-10-CM | POA: Diagnosis not present

## 2022-10-10 DIAGNOSIS — I10 Essential (primary) hypertension: Secondary | ICD-10-CM | POA: Diagnosis present

## 2022-10-10 DIAGNOSIS — Z79899 Other long term (current) drug therapy: Secondary | ICD-10-CM | POA: Diagnosis not present

## 2022-10-10 DIAGNOSIS — E871 Hypo-osmolality and hyponatremia: Secondary | ICD-10-CM | POA: Diagnosis not present

## 2022-10-10 LAB — URINALYSIS, ROUTINE W REFLEX MICROSCOPIC
Bilirubin Urine: NEGATIVE
Glucose, UA: NEGATIVE mg/dL
Hgb urine dipstick: NEGATIVE
Ketones, ur: NEGATIVE mg/dL
Nitrite: NEGATIVE
Protein, ur: NEGATIVE mg/dL
Specific Gravity, Urine: 1.006 (ref 1.005–1.030)
pH: 6 (ref 5.0–8.0)

## 2022-10-10 LAB — RESP PANEL BY RT-PCR (RSV, FLU A&B, COVID)  RVPGX2
Influenza A by PCR: NEGATIVE
Influenza B by PCR: NEGATIVE
Resp Syncytial Virus by PCR: NEGATIVE
SARS Coronavirus 2 by RT PCR: NEGATIVE

## 2022-10-10 MED ORDER — OLANZAPINE 5 MG PO TABS
5.0000 mg | ORAL_TABLET | Freq: Two times a day (BID) | ORAL | Status: DC | PRN
  Administered 2022-10-11 – 2022-10-17 (×4): 5 mg via ORAL
  Filled 2022-10-10 (×4): qty 1

## 2022-10-10 MED ORDER — LISINOPRIL 20 MG PO TABS
10.0000 mg | ORAL_TABLET | Freq: Every day | ORAL | Status: DC
  Administered 2022-10-10 – 2022-10-27 (×18): 10 mg via ORAL
  Filled 2022-10-10 (×18): qty 1

## 2022-10-10 MED ORDER — MIRTAZAPINE 15 MG PO TABS
15.0000 mg | ORAL_TABLET | Freq: Once | ORAL | Status: AC
  Administered 2022-10-10: 15 mg via ORAL
  Filled 2022-10-10: qty 1

## 2022-10-10 MED ORDER — BUPROPION HCL ER (XL) 300 MG PO TB24
300.0000 mg | ORAL_TABLET | Freq: Every morning | ORAL | Status: DC
  Administered 2022-10-11 – 2022-11-02 (×23): 300 mg via ORAL
  Filled 2022-10-10 (×21): qty 1

## 2022-10-10 MED ORDER — ALUM & MAG HYDROXIDE-SIMETH 200-200-20 MG/5ML PO SUSP: 30.0000 mL | ORAL | Status: DC | PRN

## 2022-10-10 MED ORDER — QUETIAPINE FUMARATE 25 MG PO TABS
50.0000 mg | ORAL_TABLET | Freq: Every day | ORAL | Status: DC
  Administered 2022-10-11 – 2022-11-01 (×22): 50 mg via ORAL
  Filled 2022-10-10 (×23): qty 2

## 2022-10-10 MED ORDER — ACETAMINOPHEN 325 MG PO TABS: 650.0000 mg | ORAL_TABLET | Freq: Four times a day (QID) | ORAL | Status: DC | PRN

## 2022-10-10 MED ORDER — MAGNESIUM HYDROXIDE 400 MG/5ML PO SUSP: 30.0000 mL | Freq: Every day | ORAL | Status: DC | PRN

## 2022-10-10 NOTE — ED Notes (Signed)
Patient transported to [facility]. JRPRN

## 2022-10-10 NOTE — Progress Notes (Signed)
   10/10/22 2025  Psych Admission Type (Psych Patients Only)  Admission Status Voluntary  Psychosocial Assessment  Patient Complaints Anxiety;Depression  Eye Contact Fair  Facial Expression Anxious  Affect Anxious  Speech Logical/coherent  Interaction Assertive  Motor Activity Other (Comment) (wnl)  Appearance/Hygiene Unremarkable;In scrubs  Behavior Characteristics Cooperative;Anxious;Guarded  Mood Anxious  Thought Process  Coherency WDL  Content WDL  Delusions None reported or observed  Perception WDL  Hallucination None reported or observed  Judgment Limited  Confusion None  Danger to Self  Current suicidal ideation? Denies  Agreement Not to Harm Self Yes  Description of Agreement verbal  Danger to Others  Danger to Others None reported or observed   Progress note   D: Pt seen in dayroom. Pt denies SI, HI, AVH. Pt contracts for safety.  Pt rates pain  0/10. Pt rates anxiety  7/10 and depression  7/10. Pt states that she has been on her current medication regimen for a year but isn't sure of of dosages because her medication is managed by her relative. She did say that she felt like the Prozac was not working for her depression. Pt states that something happened to her recently but did not elaborate on what it was.  Pt adamant that she has not been taking Seroquel even though it is on her PTA med list. "I take mirtazapine at night for sleep. I usually sleep through the night. I don't take Seroquel. That's not correct." Night time provider notified for new orders. No other concerns noted at this time.  A: Pt provided support and encouragement. Pt given scheduled medication as prescribed. PRNs as appropriate. Q15 min checks for safety.   R: Pt safe on the unit. Will continue to monitor.

## 2022-10-10 NOTE — Progress Notes (Addendum)
Pt was accepted to [facility] Unit TODAY 10/10/2022, pending signed voluntary consent faxed to [phone]. Bed assignment: 32  Pt meets inpatient criteria per Charmaine Downs, NP  Attending Physician will be Caren Griffins, DO  Report can be called to: [phone]  Pt can arrive after pending items are received; [facility] to coordinate arrival time  Care Team Notified: [facility] Lynnda Shields, RN, Charmaine Downs, NP, Alethia Berthold, MD, Pennelope Bracken, RN, Maxie Better, RN, and Waylan Boga, NP  Denna Haggard, [place]  10/10/2022 1:58 PM

## 2022-10-10 NOTE — ED Provider Notes (Signed)
Emergency Medicine Observation Re-evaluation Note  Caitlyn [name] is a 77 y.o. female, seen on rounds today.  Pt initially presented to the ED for complaints of Suicidal Currently, the patient is resting.  Physical Exam  BP 135/88 (BP Location: Right Arm)   Pulse (!) 55   Temp 98.2 F (36.8 C) (Oral)   Resp 18   SpO2 97%  Physical Exam General: No acute distress Cardiac: Regular rate Lungs: Normal effort Psych:   ED Course / MDM  EKG:   I have reviewed the labs performed to date as well as medications administered while in observation.  Recent changes in the last 24 hours include no acute events.  Plan  Current plan is for psychiatric admission.    Dorie Rank, MD 10/10/22 [phone]

## 2022-10-10 NOTE — ED Notes (Signed)
Report called to [facility], report given to [facility]. Voluntary consent signed by patient and faxed to [facility]. JRPRN

## 2022-10-10 NOTE — Group Note (Deleted)
Recreation Therapy Group Note   Group Topic:Coping Skills  Group Date: 10/10/2022 Start Time:  2:00 PM End Time:  2:55 PM Facilitators: Vilma Prader, LRT Location:  Dayroom       Affect/Mood: {RT BHH Affect/Mood:26271}   Participation Level: {RT BHH Participation Level:26267}   Participation Quality: {RT BHH Participation Quality:26268}   Behavior: {RT BHH Group Behavior:26269}   Speech/Thought Process: {RT BHH Speech/Thought:26276}   Insight: {RT BHH Insight:26272}   Judgement: {RT BHH Judgement:26278}   Modes of Intervention: {RT BHH Modes of Intervention:26277}   Patient Response to Interventions:  {RT BHH Patient Response to Intervention:26274}   Education Outcome:  {RT [place] Education Outcome:26279}   Clinical Observations/Individualized Feedback: *** was *** in their participation of session activities and group discussion. Pt identified ***   Plan: {RT BHH Tx ZX:1964512   Vilma Prader, LRT,  10/10/2022 3:02 PM

## 2022-10-10 NOTE — ED Notes (Addendum)
2 copies of unit guidelines given to dtr in law and reviewed. She is asking for updates and CALL BEFORE SHE IS TRANSFERRED.

## 2022-10-10 NOTE — Progress Notes (Signed)
[facility] Psych ED Progress Note  10/10/2022 12:21 PM Caitlyn [name]  MRN:  [id number]   Subjective:  Caitlyn [name] is a 77 y.o. female patient admitted with previous hx of Depression .brought in by daughter in law from home after she caught her standing on a chair wanting to jump off a fourth story building balcony.  Patient has suffered depression off and on for more than twenty years.  This bout of Depression started in January when she had to isolate due to Covid infection.  Patient was evaluated this morning with daughter in law at bed side.  Patient remains remorseful and tearful.  She is not happy regarding what she has done to end her life but she is promising not to repeat same.  Patient reports poor sleep last night despite taking her night medications.  She denies HI/AVH.  She continues to require inpatient Psychiatry hospitalization.  We will seek placement at any facility with available bed starting with [facility] where her Psychiatrist is. Principal Problem: Suicide attempt [facility]) Diagnosis:  Principal Problem:   Suicide attempt [facility]) Active Problems:   Moderately severe recurrent major depression [facility])   ED Assessment Time Calculation: Start Time: 1209 Stop Time: 1220 Total Time in Minutes (Assessment Completion): 11   Past Psychiatric History: see initial Psychiatry evaluation  [place] Scale:  [place] ED from 10/09/2022 in [facility] Emergency Department at [facility] ED from 01/07/2022 in [facility] Emergency Department at [place] High Risk No Risk       Past Medical History:  Past Medical History:  Diagnosis Date   Anxiety    Depression    Hypertension     Past Surgical History:  Procedure Laterality Date   EYE SURGERY     Family History:  Family History  Problem Relation Age of Onset   Breast cancer Neg Hx    Family Psychiatric  History: see initial Psychiatry evaluation Social History:  Social History    Substance and Sexual Activity  Alcohol Use Never     Social History   Substance and Sexual Activity  Drug Use Never    Social History   Socioeconomic History   Marital status: Married    Spouse name: Not on file   Number of children: Not on file   Years of education: Not on file   Highest education level: Not on file  Occupational History   Not on file  Tobacco Use   Smoking status: Never   Smokeless tobacco: Never  Vaping Use   Vaping Use: Never used  Substance and Sexual Activity   Alcohol use: Never   Drug use: Never   Sexual activity: Not on file  Other Topics Concern   Not on file  Social History Narrative   Not on file   Social Determinants of Health   Financial Resource Strain: Not on file  Food Insecurity: Not on file  Transportation Needs: Not on file  Physical Activity: Not on file  Stress: Not on file  Social Connections: Not on file    Sleep: Fair  Appetite:  Fair  Current Medications: Current Facility-Administered Medications  Medication Dose Route Frequency Provider Last Rate Last Admin   buPROPion (WELLBUTRIN XL) 24 hr tablet 300 mg  300 mg Oral Daily Shantal Roan C, NP   300 mg at 10/10/22 1018   clonazePAM (KLONOPIN) tablet 0.5 mg  0.5 mg Oral BID Oniel Meleski C, NP   0.5 mg at  10/10/22 0932   FLUoxetine (PROZAC) capsule 20 mg  20 mg Oral QODAY Arletha Marschke C, NP   20 mg at 10/10/22 1018   [START ON 10/11/2022] FLUoxetine (PROZAC) capsule 40 mg  40 mg Oral QODAY Dann Galicia C, NP       lisinopril (ZESTRIL) tablet 10 mg  10 mg Oral Daily Carlisle Cater, PA-C   10 mg at 10/10/22 0931   mirtazapine (REMERON) tablet 15 mg  15 mg Oral QHS Charmaine Downs C, NP   15 mg at 10/09/22 2151   Current Outpatient Medications  Medication Sig Dispense Refill   buPROPion (WELLBUTRIN XL) 300 MG 24 hr tablet Take 300 mg by mouth every morning.     clonazePAM (KLONOPIN) 1 MG tablet Take 1 mg by mouth 2 (two) times daily.      cycloSPORINE (RESTASIS) 0.05 % ophthalmic emulsion Place 1 drop into both eyes daily.     FLUoxetine (PROZAC) 20 MG capsule Take 20-40 mg by mouth See admin instructions. Takes 20 mg and 40 mg on alternate days.     gabapentin (NEURONTIN) 100 MG capsule Take 100 mg by mouth 2 (two) times daily.     lisinopril (ZESTRIL) 10 MG tablet Take 10 mg by mouth daily.     loperamide (IMODIUM A-D) 2 MG tablet Take 2 mg by mouth as needed for diarrhea or loose stools.     mirtazapine (REMERON) 15 MG tablet Take 15 mg by mouth at bedtime.     QUEtiapine (SEROQUEL) 50 MG tablet Take 50 mg by mouth at bedtime.      Lab Results:  Results for orders placed or performed during the hospital encounter of 10/09/22 (from the past 48 hour(s))  Comprehensive metabolic panel     Status: Abnormal   Collection Time: 10/09/22  3:59 PM  Result Value Ref Range   Sodium 133 (L) 135 - 145 mmol/L   Potassium 4.0 3.5 - 5.1 mmol/L   Chloride 100 98 - 111 mmol/L   CO2 23 22 - 32 mmol/L   Glucose, Bld 88 70 - 99 mg/dL    Comment: Glucose reference range applies only to samples taken after fasting for at least 8 hours.   BUN 13 8 - 23 mg/dL   Creatinine, Ser 0.59 0.44 - 1.00 mg/dL   Calcium 9.3 8.9 - 10.3 mg/dL   Total Protein 7.2 6.5 - 8.1 g/dL   Albumin 4.2 3.5 - 5.0 g/dL   AST 21 15 - 41 U/L   ALT 15 0 - 44 U/L   Alkaline Phosphatase 53 38 - 126 U/L   Total Bilirubin 0.6 0.3 - 1.2 mg/dL   GFR, Estimated >60 >60 mL/min    Comment: (NOTE) Calculated using the CKD-EPI Creatinine Equation (2021)    Anion gap 10 5 - 15    Comment: Performed at [facility], [place] [address], [place], [place] [address]  Ethanol     Status: None   Collection Time: 10/09/22  3:59 PM  Result Value Ref Range   Alcohol, Ethyl (B) <10 <10 mg/dL    Comment: (NOTE) Lowest detectable limit for serum alcohol is 10 mg/dL.  For medical purposes only. Performed at [facility], [place] [address], [place], [place] [id number]   Salicylate level     Status: Abnormal   Collection Time: 10/09/22  3:59 PM  Result Value Ref Range   Salicylate Lvl [id number] (L) 7.0 - 30.0 mg/dL    Comment: Performed at  [facility], [place] [address], [place], [place] [address]  Acetaminophen level     Status: Abnormal   Collection Time: 10/09/22  3:59 PM  Result Value Ref Range   Acetaminophen (Tylenol), Serum <10 (L) 10 - 30 ug/mL    Comment: (NOTE) Therapeutic concentrations vary significantly. A range of 10-30 ug/mL  may be an effective concentration for many patients. However, some  are best treated at concentrations outside of this range. Acetaminophen concentrations >150 ug/mL at 4 hours after ingestion  and >50 ug/mL at 12 hours after ingestion are often associated with  toxic reactions.  Performed at [facility], [place] [address] [place], [place] [address]   cbc     Status: None   Collection Time: 10/09/22  3:59 PM  Result Value Ref Range   WBC 7.2 4.0 - 10.5 K/uL   RBC 4.06 3.87 - 5.11 MIL/uL   Hemoglobin 12.8 12.0 - 15.0 g/dL   HCT 38.4 36.0 - 46.0 %   MCV 94.6 80.0 - 100.0 fL   MCH 31.5 26.0 - 34.0 pg   MCHC 33.3 30.0 - 36.0 g/dL   RDW 12.5 11.5 - 15.5 %   Platelets 243 150 - 400 K/uL   nRBC 0.0 0.0 - 0.2 %    Comment: Performed at [facility], [place] [address], [place], [place] [address]  Rapid urine drug screen (hospital performed)     Status: None   Collection Time: 10/09/22  9:09 PM  Result Value Ref Range   Opiates NONE DETECTED NONE DETECTED   Cocaine NONE DETECTED NONE DETECTED   Benzodiazepines NONE DETECTED NONE DETECTED   Amphetamines NONE DETECTED NONE DETECTED   Tetrahydrocannabinol NONE DETECTED NONE DETECTED   Barbiturates NONE DETECTED NONE DETECTED    Comment: (NOTE) DRUG SCREEN FOR MEDICAL PURPOSES ONLY.  IF CONFIRMATION IS NEEDED FOR ANY PURPOSE, NOTIFY LAB WITHIN 5 DAYS.  LOWEST DETECTABLE LIMITS FOR  URINE DRUG SCREEN Drug Class                     Cutoff (ng/mL) Amphetamine and metabolites    1000 Barbiturate and metabolites    200 Benzodiazepine                 200 Opiates and metabolites        300 Cocaine and metabolites        300 THC                            50 Performed at [facility], [place] [address], [place], [place] [address]   Urinalysis, Routine w reflex microscopic -Urine, Clean Catch     Status: Abnormal   Collection Time: 10/10/22 11:24 AM  Result Value Ref Range   Color, Urine YELLOW YELLOW   APPearance CLEAR CLEAR   Specific Gravity, Urine 1.006 1.005 - 1.030   pH 6.0 5.0 - 8.0   Glucose, UA NEGATIVE NEGATIVE mg/dL   Hgb urine dipstick NEGATIVE NEGATIVE   Bilirubin Urine NEGATIVE NEGATIVE   Ketones, ur NEGATIVE NEGATIVE mg/dL   Protein, ur NEGATIVE NEGATIVE mg/dL   Nitrite NEGATIVE NEGATIVE   Leukocytes,Ua TRACE (A) NEGATIVE   RBC / HPF 0-5 0 - 5 RBC/hpf   WBC, UA 0-5 0 - 5 WBC/hpf   Bacteria, UA RARE (A) NONE SEEN   Squamous Epithelial / HPF 0-5 0 - 5 /HPF   Mucus PRESENT  Comment: Performed at [facility], [place] [address] [place], [place] [address]    Blood Alcohol level:  Lab Results  Component Value Date   ETH <10 10/09/2022   ETH <10 01/07/2022    Physical Findings:  CIWA:    COWS:     Musculoskeletal: Strength & Muscle Tone: within normal limits Gait & Station: normal Patient leans: Front  Psychiatric Specialty Exam:  Presentation  General Appearance:  Casual; Well Groomed  Eye Contact: Good  Speech: Clear and Coherent; Normal Rate  Speech Volume: Normal  Handedness: Right   Mood and Affect  Mood: Anxious; Depressed; Hopeless  Affect: Congruent; Depressed   Thought Process  Thought Processes: Coherent; Goal Directed; Linear  Descriptions of Associations:Intact  Orientation:Full (Time, Place and Person)  Thought Content:Logical  History of  Schizophrenia/Schizoaffective disorder:No data recorded Duration of Psychotic Symptoms:No data recorded Hallucinations:Hallucinations: None  Ideas of Reference:None  Suicidal Thoughts:Suicidal Thoughts: Yes, Passive SI Active Intent and/or Plan: With Intent; With Plan; With Means to Carry Out; With Access to Means  Homicidal Thoughts:Homicidal Thoughts: No   Sensorium  Memory: Immediate Good; Recent Good; Remote Good  Judgment: Impaired  Insight: Fair   Community education officer  Concentration: Good  Attention Span: Good  Recall: Good  Fund of Knowledge: Good  Language: Good   Psychomotor Activity  Psychomotor Activity: Psychomotor Activity: Normal   Assets  Assets: Communication Skills; Desire for Improvement; Financial Resources/Insurance; Housing; Intimacy; Physical Health   Sleep  Sleep: Sleep: Fair    Physical Exam: Physical Exam Vitals and nursing note reviewed.  Constitutional:      Appearance: Normal appearance.  HENT:     Head: Normocephalic and atraumatic.     Nose: Nose normal.  Cardiovascular:     Rate and Rhythm: Regular rhythm. Tachycardia present.  Pulmonary:     Effort: Pulmonary effort is normal.  Musculoskeletal:        General: Normal range of motion.     Cervical back: Normal range of motion.  Skin:    General: Skin is warm and dry.  Neurological:     Mental Status: She is alert and oriented to person, place, and time.    Review of Systems  Constitutional: Negative.   HENT: Negative.    Eyes: Negative.   Respiratory: Negative.    Cardiovascular: Negative.   Gastrointestinal: Negative.   Genitourinary: Negative.   Musculoskeletal: Negative.   Skin: Negative.   Neurological: Negative.   Endo/Heme/Allergies: Negative.   Psychiatric/Behavioral:  Positive for depression and suicidal ideas. The patient is nervous/anxious.    Blood pressure 135/88, pulse (!) 55, temperature 98.2 F (36.8 C), temperature source Oral,  resp. rate 18, SpO2 97 %. There is no height or weight on file to calculate BMI.   Medical Decision Making: Patient meets criteria for inpatient Psychiatry hospitalization.  Patient is a danger to herself based on multiple attempts and means she plans to use to hurt herself.  Continue current plan of care, continue current Medication.  Problem 1: Suicide attempts  Problem 2: Recurrent Major Depressive disorder, severe without Psychotic features.   Delfin Gant, NP-PMHNP-BC 10/10/2022, 12:21 PM

## 2022-10-10 NOTE — Progress Notes (Signed)
Pt has Lisinopril (Zestril) scheduled for tonight even though it is daily and she was given this medication at [facility]. Spoke with provider. Due to pt BP being elevated this evening, this writer was told to go ahead and give the dose scheduled for tonight. Also, pt adamant that she doesn't take Seroquel. Provider placed orders for one-time dose of mirtazapine for pt for sleep tonight. Will continue to monitor.

## 2022-10-10 NOTE — ED Notes (Signed)
Safe transport contacted and will be en route shortly to transfer patient to [place]

## 2022-10-10 NOTE — Group Note (Signed)
Date:  10/10/2022 Time:  8:40 PM  Group Topic/Focus:  Healthy Communication:   The focus of this group is to discuss communication, barriers to communication, as well as healthy ways to communicate with others.    Participation Level:  Active  Participation Quality:  Attentive  Affect:  Anxious and Excited  Cognitive:  Oriented  Insight: Good  Engagement in Group:  Engaged  Modes of Intervention:  Discussion  Additional Comments:  Feeling statements pg.26 Adult Unit Workbook  [place] Route 10/10/2022, 8:40 PM

## 2022-10-10 NOTE — Tx Team (Signed)
Initial Treatment Plan 10/10/2022 6:57 PM [name] [id number]    PATIENT STRESSORS: Other: just moved here from [place]: Capable of independent living  Communication skills  General fund of knowledge  Supportive family/friends    PATIENT IDENTIFIED PROBLEMS:                      DISCHARGE CRITERIA:  Ability to meet basic life and health needs Improved stabilization in mood, thinking, and/or behavior  PRELIMINARY DISCHARGE PLAN: Return to previous living arrangement  PATIENT/FAMILY INVOLVEMENT: This treatment plan has been presented to and reviewed with the patient, Caitlyn [name]. The patient has been given the opportunity to ask questions and make suggestions.  Nolon Bussing, RN 10/10/2022, 6:57 PM

## 2022-10-10 NOTE — Progress Notes (Signed)
LCSW Progress Note  [id number]   Caitlyn [name]  10/10/2022  11:14 AM  Description:   Inpatient Psychiatric Referral  Patient was recommended inpatient per Charmaine Downs, NP. Pt's daughter reports pt prefers [facility] for inpatient treatment. Patient was referred to the following facility:  Destination  Service Provider Address Phone Fax  [facility]  [address] [place] [address] [phone]     Situation ongoing, CSW to continue following and update chart as more information becomes available.      Denna Haggard, [place]  10/10/2022 11:14 AM

## 2022-10-10 NOTE — Progress Notes (Signed)
Patient admitted voluntary to [facility] from [facility] ED with diagnosis of worsening depression, found by daughter- in- law standing on a chair about to jump from a balcony in a suicide attempt. Patient presents to unit ambulatory A&Ox4.  Patient states, "I want to stop being nervous." Patient's affect is anxious, speech is clear and thoughts are organized. Patient endorses depression and anxiety stating her main stressors are relocating to [place]?  Patient currently denies suicidal ideations, homicidal ideations, audio or visual hallucinations and verbally contracts for safety on unit. Denies incontinence and reports last BM yesterday.  Patient denies smoking, drug/alcohol use. Patient reports living with husband.    Emotional support and reassurance provided throughout admission intake. Afterwards, oriented patient to unit, room and call light, reviewed POC with all questions answered and understanding verbalzied.  Denies any needs at this time.  Will continue to monitor with ongoing Q 15 minute safety checks per unit protocol.

## 2022-10-11 ENCOUNTER — Encounter: Payer: Self-pay | Admitting: Psychiatry

## 2022-10-11 DIAGNOSIS — F332 Major depressive disorder, recurrent severe without psychotic features: Secondary | ICD-10-CM | POA: Diagnosis not present

## 2022-10-11 MED ORDER — FLUOXETINE HCL 20 MG PO CAPS
40.0000 mg | ORAL_CAPSULE | ORAL | Status: DC
  Administered 2022-10-12 – 2022-10-18 (×4): 40 mg via ORAL
  Filled 2022-10-11 (×4): qty 2

## 2022-10-11 MED ORDER — CLONAZEPAM 1 MG PO TABS
1.0000 mg | ORAL_TABLET | Freq: Two times a day (BID) | ORAL | Status: DC
  Administered 2022-10-11 – 2022-10-14 (×7): 1 mg via ORAL
  Filled 2022-10-11 (×7): qty 1

## 2022-10-11 MED ORDER — MIRTAZAPINE 15 MG PO TABS
30.0000 mg | ORAL_TABLET | Freq: Every day | ORAL | Status: DC
  Administered 2022-10-11: 30 mg via ORAL
  Filled 2022-10-11: qty 2

## 2022-10-11 MED ORDER — ENSURE ENLIVE PO LIQD: 237.0000 mL | Freq: Two times a day (BID) | ORAL | Status: DC

## 2022-10-11 MED ORDER — FLUOXETINE HCL 20 MG PO CAPS
20.0000 mg | ORAL_CAPSULE | ORAL | Status: DC
  Administered 2022-10-11 – 2022-10-17 (×4): 20 mg via ORAL
  Filled 2022-10-11 (×3): qty 1

## 2022-10-11 MED ORDER — ADULT MULTIVITAMIN W/MINERALS CH
1.0000 | ORAL_TABLET | Freq: Every day | ORAL | Status: DC
  Administered 2022-10-11 – 2022-11-02 (×23): 1 via ORAL
  Filled 2022-10-11 (×20): qty 1

## 2022-10-11 NOTE — Plan of Care (Signed)
Pt endorses anxiety/depression at this time. Pt denies SI/HI/AVH or pain at this time. Pt is calm and cooperative. Pt is medication compliant. Pt provided with support and encouragement. Pt monitored q15 minutes for safety per unit policy. Plan of care ongoing.   Pt voices concerns about not receiving some her the medication that she takes at home and the possible side effects that can be experienced from missing doses. Pt reassured she is being monitored and that her concerns will be brought to the MD's attention. Pt also reassured the MD will come and speak with her this morning and at that time she can voice her concerns, talk about her medications, and anything else that she finds important. Pt was satisfied with this answer.  1000 Pt's daughter-in-law called asking what items the pt is allowed to have on the unit. Daughter-in-law was informed by this Probation officer what items were allowed on the unit. Pt's daughter-in-law reported that the pt is a pleasant woman, will take her medications, will eat while here, and do what needs to be done so that she may leave. However, pt's daughter-in-law stated that her depression is really bad when she leaves the facility. Pt's daughter-in-law shared that she has a mental health hx and of coming to places such as ours. She also voices concern for her weight and that her son in [place] mentioned interested in trying a new diet for her, a healthier diet such a one that is gluten free. Pt's family is very supportive and involved in pt's care/well-being.  Daughter-in-law also left phone numbers to pt's husband, sons, and her own.  Pt would not eat much from her dinner tray reporting she couldn't eat if because she just didn't like any of it and it was too early for her to eat. This Probation officer observed physical signs of anxiety and offered prn medication. Pt willing took prn medication. This Probation officer also asked pt what she would like to eat and ordered the pt a meal she would eat.     Caitlyn [name] (son) [phone] Caitlyn [name] (daughter-in-law/Brad's wife) [phone] They live in [place], [place] (Husband) [phone] does not drive, is chair bound  Caitlyn [name] (son) [phone] 1004 live is [place], [place]  Problem: Activity: Goal: Risk for activity intolerance will decrease Outcome: Progressing   Problem: Education: Goal: Knowledge of General Education information will improve Description: Including pain rating scale, medication(s)/side effects and non-pharmacologic comfort measures Outcome: Not Progressing

## 2022-10-11 NOTE — Group Note (Signed)
Recreation Therapy Group Note   Group Topic:Coping Skills  Group Date: 10/11/2022 Start Time: 1400 End Time: 1445 Facilitators: Vilma Prader, LRT, CTRS Location:  Dayroom  Group Description: Seated Exercise. LRT discussed the mental and physical benefits of exercise. LRT and group discussed how physical activity can be used as a coping skill. Pt's and LRT followed along to an exercise video on the TV screen that provided a visual representation and audio description of every exercise performed. Pt's encouraged to listen to their bodies and stop at any time if they experience feelings of discomfort or pain. LRT passed out water after session was over and encouraged pts do drink and stay hydrated.  Affect/Mood: Appropriate   Participation Level: Active and Engaged   Participation Quality: Independent   Behavior: Appropriate   Speech/Thought Process: Concrete and Directed   Insight: Good   Judgement: Good   Modes of Intervention: Activity and Education   Patient Response to Interventions:  Attentive, Engaged, Interested , and Receptive   Education Outcome:  Acknowledges education   Clinical Observations/Individualized Feedback: Caitlyn [name] was active in their participation of session activities and group discussion. Pt performed all exercises with no complaints of pain. Pt excused herself to her room afterwards because she said she was feeling "down" but that the exercise helped some.    Plan: Continue to engage patient in RT group sessions 2-3x/week.   Vilma Prader, LRT, CTRS 10/11/2022 2:58 PM

## 2022-10-11 NOTE — Plan of Care (Signed)
Problem: Coping Skills Goal: STG - Patient will identify 3 positive coping skills strategies to use for depression post d/c within 5 recreation therapy group sessions Description: STG - Patient will identify 3 positive coping skills strategies to use for depression post d/c within 5 recreation therapy group sessions Outcome: Progressing   Caitlyn [name] LRT, CTRS

## 2022-10-11 NOTE — [facility] Suicide Risk Assessment (Signed)
[facility] Admission Suicide Risk Assessment   Nursing information obtained from:  Patient Demographic factors:  Caucasian Current Mental Status:  Suicidal ideation indicated by patient Loss Factors:  NA (relocation) Historical Factors:  Prior suicide attempts Risk Reduction Factors:  Living with another person, especially a relative, Positive social support  Total Time spent with patient: 45 minutes Principal Problem: Major depressive disorder, recurrent severe without psychotic features ([place]) Diagnosis:  Principal Problem:   Major depressive disorder, recurrent severe without psychotic features ([place])  Subjective Data: Patient seen and chart reviewed.  Spoke with family as well.  77 year old woman with a history of recurrent severe depression who has been getting worse in terms of depression for about the past month and has been starting to endorse more frequent and specific suicidal ideation.  Patient cooperative with interview.  Denies intent or plan to act on suicidal thoughts in the hospital.  Tends to minimize symptoms a little bit.  Denies psychotic symptoms.  Cooperative with good insight to the need for treatment.  Continued Clinical Symptoms:  Alcohol Use Disorder Identification Test Final Score (AUDIT): 0 The "Alcohol Use Disorders Identification Test", Guidelines for Use in Primary Care, Second Edition.  World Pharmacologist [facility]). Score between 0-7:  no or low risk or alcohol related problems. Score between 8-15:  moderate risk of alcohol related problems. Score between 16-19:  high risk of alcohol related problems. Score 20 or above:  warrants further diagnostic evaluation for alcohol dependence and treatment.   CLINICAL FACTORS:   Depression:   Anhedonia Hopelessness Insomnia Severe   Musculoskeletal: Strength & Muscle Tone: within normal limits Gait & Station: normal Patient leans: N/A  Psychiatric Specialty Exam:  Presentation  General Appearance:  Casual;  Well Groomed  Eye Contact: Good  Speech: Clear and Coherent; Normal Rate  Speech Volume: Normal  Handedness: Right   Mood and Affect  Mood: Anxious; Depressed; Hopeless  Affect: Congruent; Depressed   Thought Process  Thought Processes: Coherent; Goal Directed; Linear  Descriptions of Associations:Intact  Orientation:Full (Time, Place and Person)  Thought Content:Logical  History of Schizophrenia/Schizoaffective disorder:No data recorded Duration of Psychotic Symptoms:No data recorded Hallucinations:Hallucinations: None  Ideas of Reference:None  Suicidal Thoughts:Suicidal Thoughts: Yes, Passive  Homicidal Thoughts:Homicidal Thoughts: No   Sensorium  Memory: Immediate Good; Recent Good; Remote Good  Judgment: Impaired  Insight: Fair   Community education officer  Concentration: Good  Attention Span: Good  Recall: Good  Fund of Knowledge: Good  Language: Good   Psychomotor Activity  Psychomotor Activity: Psychomotor Activity: Normal   Assets  Assets: Communication Skills; Desire for Improvement; Financial Resources/Insurance; Housing; Intimacy; Physical Health   Sleep  Sleep: Sleep: Fair    Physical Exam: Physical Exam Vitals and nursing note reviewed.  Constitutional:      Appearance: Normal appearance.  HENT:     Head: Normocephalic and atraumatic.     Mouth/Throat:     Pharynx: Oropharynx is clear.  Eyes:     Pupils: Pupils are equal, round, and reactive to light.  Cardiovascular:     Rate and Rhythm: Normal rate and regular rhythm.  Pulmonary:     Effort: Pulmonary effort is normal.     Breath sounds: Normal breath sounds.  Abdominal:     General: Abdomen is flat.     Palpations: Abdomen is soft.  Musculoskeletal:        General: Normal range of motion.  Skin:    General: Skin is warm and dry.  Neurological:     General:  No focal deficit present.     Mental Status: She is alert. Mental status is at baseline.   Psychiatric:        Attention and Perception: Attention normal.        Mood and Affect: Mood is anxious and depressed. Affect is blunt.        Speech: Speech normal.        Behavior: Behavior is slowed.        Thought Content: Thought content includes suicidal ideation. Thought content does not include suicidal plan.        Cognition and Memory: Cognition normal.    Review of Systems  Constitutional: Negative.   HENT: Negative.    Eyes: Negative.   Respiratory: Negative.    Cardiovascular: Negative.   Gastrointestinal: Negative.   Musculoskeletal: Negative.   Skin: Negative.   Neurological: Negative.   Psychiatric/Behavioral:  Positive for depression and suicidal ideas. Negative for hallucinations and substance abuse. The patient is nervous/anxious and has insomnia.    Blood pressure (!) 143/99, pulse 61, temperature 98 F (36.7 C), temperature source Oral, resp. rate 16, height 5' 2"$  (1.575 m), weight 49.4 kg, SpO2 96 %. Body mass index is 19.94 kg/m.   COGNITIVE FEATURES THAT CONTRIBUTE TO RISK:  Thought constriction (tunnel vision)    SUICIDE RISK:   Mild:  Suicidal ideation of limited frequency, intensity, duration, and specificity.  There are no identifiable plans, no associated intent, mild dysphoria and related symptoms, good self-control (both objective and subjective assessment), few other risk factors, and identifiable protective factors, including available and accessible social support.  PLAN OF CARE: Continue 15-minute checks.  Continue current medication with some adjustment.  Spoke with patient and family about other options for treatment.  Include in individual and group counseling and assessment.  Ongoing assessment of dangerousness prior to any discharge planning  I certify that inpatient services furnished can reasonably be expected to improve the patient's condition.   Alethia Berthold, MD 10/11/2022, 10:50 AM

## 2022-10-11 NOTE — Group Note (Signed)
[place] LCSW Group Therapy Note   Group Date: 10/11/2022 Start Time: 1330 End Time: 1345   Type of Therapy/Topic:  Group Therapy:  Emotion Regulation  Participation Level:  Did Not Attend   Mood:  Description of Group:    The purpose of this group is to assist patients in learning to regulate negative emotions and experience positive emotions. Patients will be guided to discuss ways in which they have been vulnerable to their negative emotions. These vulnerabilities will be juxtaposed with experiences of positive emotions or situations, and patients challenged to use positive emotions to combat negative ones. Special emphasis will be placed on coping with negative emotions in conflict situations, and patients will process healthy conflict resolution skills.  Therapeutic Goals: Patient will identify two positive emotions or experiences to reflect on in order to balance out negative emotions:  Patient will label two or more emotions that they find the most difficult to experience:  Patient will be able to demonstrate positive conflict resolution skills through discussion or role plays:   Summary of Patient Progress:   X    Therapeutic Modalities:   Cognitive Behavioral Therapy Feelings Identification Dialectical Behavioral Therapy   Huxley Shurley A [place], LCSWA

## 2022-10-11 NOTE — Progress Notes (Signed)
NUTRITION ASSESSMENT  Pt identified as at risk on the Malnutrition Screen Tool  INTERVENTION:  -Ensure Enlive po BID, each supplement provides 350 kcal and 20 grams of protein -MVI with minerals daily   NUTRITION DIAGNOSIS: Unintentional weight loss related to sub-optimal intake as evidenced by pt report.   Goal: Pt to meet >/= 90% of their estimated nutrition needs.  Monitor:  PO intake  Assessment:   Pt admitted with worsening depression.   Pt has been participating in group sessions; has been attentive but anxious. Per RN, pt has been refusing some of her medications  Pt currently on a regular diet. No meal completion data available to assess at this time.  Reviewed wt hx; no wt loss noted over the past 9 months.   Medications reviewed.   Pt would benefit from addition of oral nutrition supplements.   Labs reviewed: Na: 73.    77 y.o. female  Height: Ht Readings from Last 1 Encounters:  10/10/22 5' 2"$  (1.575 m)    Weight: Wt Readings from Last 1 Encounters:  10/10/22 49.4 kg    Weight Hx: Wt Readings from Last 10 Encounters:  10/10/22 49.4 kg  01/07/22 47.6 kg  12/24/21 49 kg    BMI:  Body mass index is 19.94 kg/m. BMI WDL.   Estimated Nutritional Needs: Kcal: 25-30 kcal/kg Protein: > 1 gram protein/kg Fluid: 1 ml/kcal  Diet Order:  Diet Order             Diet regular Room service appropriate? Yes; Fluid consistency: Thin  Diet effective now                  Pt is also offered choice of unit snacks mid-morning and mid-afternoon.  Pt is eating as desired.   Lab results and medications reviewed.   Loistine Chance, RD, LDN, [place] Registered Dietitian II Certified Diabetes Care and Education Specialist Please refer to [facility] for RD and/or RD on-call/weekend/after hours pager

## 2022-10-11 NOTE — H&P (Signed)
Psychiatric Admission Assessment Adult  Patient Identification: Caitlyn [name] MRN:  [id number] Date of Evaluation:  10/11/2022 Chief Complaint:  Major depressive disorder, recurrent severe without psychotic features ([place]) [F33.2] Principal Diagnosis: Major depressive disorder, recurrent severe without psychotic features ([place]) Diagnosis:  Principal Problem:   Major depressive disorder, recurrent severe without psychotic features ([place])  History of Present Illness: Patient seen and chart reviewed.  Also spoke with patient's family with her permission.  77 year old woman with a history of recurrent severe depression transferred to [place] voluntarily from [place] because of a return of severe depressive symptoms.  Patient was brought by her family to the emergency room in [place] because she was starting to have more frequent and specific suicidal thought she was discussing with her family.  Patient and family agree that her depression has been getting worse for the past several weeks.  Patient reports that she wakes up very early in the morning even with medication.  Feels tired and run down and seems to have low motivation.  Not able to really articulate very much in the way of things that she is enjoying currently.  Says that appetite is fine but admits that she has lost some weight recently.  Patient denies any hallucinations or delusions.  She states that she has been compliant with her recommended outpatient medications and is able to accurately describe them to me.  Family apparently has been concerned that she may not always be fully compliant with all medicine.  Patient does not drink or abuse any drugs.  Recent major stresses within the last year were relocating from [facility] to an assisted living facility here locally and also her husband's health declining. Associated Signs/Symptoms: Depression Symptoms:  depressed mood, anhedonia, insomnia, fatigue, hopelessness, suicidal thoughts with  specific plan, weight loss, (Hypo) Manic Symptoms:   None Anxiety Symptoms:  Excessive Worry, Psychotic Symptoms:   None reported PTSD Symptoms: Negative Total Time spent with patient: 45 minutes  Past Psychiatric History: Patient and family both agree that patient has had a longstanding issue with depression but for many years symptoms tend to be relatively mild.  In the past year she has had much worse depression.  She has had a lengthy hospitalization at [facility] and has had outpatient treatment with multiple antidepressants as well as having received transcranial magnetic stimulation over the summer.  TMS was effective at the time but patient ultimately of course relapsed into her depression.  ECT has been discussed with the patient had deferred to the decision at this point.  Daughter-in-law reports that when the patient was getting [place] she was at least a little bit concerned about hypomania but that appears not to have turned into a clinical problem and there is no documented history of bipolar disorder.  No history of substance abuse.  More recently she has had some very specific suicidal ideation including thinking of jumping out of a high window or being choked with a scarf.  She is currently on fluoxetine mirtazapine and bupropion as well as chronic clonazepam.  Is the patient at risk to self? Yes.    Has the patient been a risk to self in the past 6 months? Yes.    Has the patient been a risk to self within the distant past? No.  Is the patient a risk to others? No.  Has the patient been a risk to others in the past 6 months? No.  Has the patient been a risk to others within the distant past? No.  [place] Scale:  Flowsheet Row Admission (Current) from 10/10/2022 in [facility] from 10/09/2022 in [facility] Emergency Department at [facility] ED from 01/07/2022 in [facility] Emergency Department at [place] High Risk High  Risk No Risk        Prior Inpatient Therapy: Yes.   If yes, describe has had recent inpatient treatments at [facility] and possibly also at old [facility] Prior Outpatient Therapy: Yes.   If yes, describe outpatient is Dr. Reece Levy who also did the [place]  Alcohol Screening: 1. How often do you have a drink containing alcohol?: Never 2. How many drinks containing alcohol do you have on a typical day when you are drinking?: 1 or 2 3. How often do you have six or more drinks on one occasion?: Never AUDIT-C Score: 0 4. How often during the last year have you found that you were not able to stop drinking once you had started?: Never 5. How often during the last year have you failed to do what was normally expected from you because of drinking?: Never 6. How often during the last year have you needed a first drink in the morning to get yourself going after a heavy drinking session?: Never 7. How often during the last year have you had a feeling of guilt of remorse after drinking?: Never 8. How often during the last year have you been unable to remember what happened the night before because you had been drinking?: Never 9. Have you or someone else been injured as a result of your drinking?: No 10. Has a relative or friend or a doctor or another health worker been concerned about your drinking or suggested you cut down?: No Alcohol Use Disorder Identification Test Final Score (AUDIT): 0 Substance Abuse History in the last 12 months:  No. Consequences of Substance Abuse: Negative Previous Psychotropic Medications: Yes  Psychological Evaluations: Yes  Past Medical History:  Past Medical History:  Diagnosis Date   Anxiety    Depression    Hypertension     Past Surgical History:  Procedure Laterality Date   EYE SURGERY     Family History:  Family History  Problem Relation Age of Onset   Breast cancer Neg Hx    Family Psychiatric  History: Nothing reported Tobacco Screening:  Social History    Tobacco Use  Smoking Status Never  Smokeless Tobacco Never    BH Tobacco Counseling     Are you interested in Tobacco Cessation Medications?  N/A, patient does not use tobacco products Counseled patient on smoking cessation:  N/A, patient does not use tobacco products Reason Tobacco Screening Not Completed: No value filed.       Social History:  Social History   Substance and Sexual Activity  Alcohol Use Never     Social History   Substance and Sexual Activity  Drug Use Never    Additional Social History:                           Allergies:   Allergies  Allergen Reactions   Penicillins    Lab Results:  Results for orders placed or performed during the hospital encounter of 10/09/22 (from the past 48 hour(s))  Comprehensive metabolic panel     Status: Abnormal   Collection Time: 10/09/22  3:59 PM  Result Value Ref Range   Sodium 133 (L) 135 - 145 mmol/L   Potassium  4.0 3.5 - 5.1 mmol/L   [place] 100 98 - 111 mmol/L   CO2 23 22 - 32 mmol/L   Glucose, Bld 88 70 - 99 mg/dL    Comment: Glucose reference range applies only to samples taken after fasting for at least 8 hours.   BUN 13 8 - 23 mg/dL   Creatinine, Ser 0.59 0.44 - 1.00 mg/dL   Calcium 9.3 8.9 - 10.3 mg/dL   Total Protein 7.2 6.5 - 8.1 g/dL   Albumin 4.2 3.5 - 5.0 g/dL   AST 21 15 - 41 U/L   ALT 15 0 - 44 U/L   Alkaline Phosphatase 53 38 - 126 U/L   Total Bilirubin 0.6 0.3 - 1.2 mg/dL   GFR, Estimated >60 >60 mL/min    Comment: (NOTE) Calculated using the CKD-EPI Creatinine Equation (2021)    Anion gap 10 5 - 15    Comment: Performed at [facility], [place] [address] [place], [place] [address]  Ethanol     Status: None   Collection Time: 10/09/22  3:59 PM  Result Value Ref Range   Alcohol, Ethyl (B) <10 <10 mg/dL    Comment: (NOTE) Lowest detectable limit for serum alcohol is 10 mg/dL.  For medical purposes only. Performed at [facility], [place] [address] [place], [place] [id number]   Salicylate level     Status: Abnormal   Collection Time: 10/09/22  3:59 PM  Result Value Ref Range   Salicylate Lvl [id number] (L) 7.0 - 30.0 mg/dL    Comment: Performed at [facility], [place] [address], [place], [place] [address]  Acetaminophen level     Status: Abnormal   Collection Time: 10/09/22  3:59 PM  Result Value Ref Range   Acetaminophen (Tylenol), Serum <10 (L) 10 - 30 ug/mL    Comment: (NOTE) Therapeutic concentrations vary significantly. A range of 10-30 ug/mL  may be an effective concentration for many patients. However, some  are best treated at concentrations outside of this range. Acetaminophen concentrations >150 ug/mL at 4 hours after ingestion  and >50 ug/mL at 12 hours after ingestion are often associated with  toxic reactions.  Performed at [facility], [place] [address], [place], [place] [address]   cbc     Status: None   Collection Time: 10/09/22  3:59 PM  Result Value Ref Range   WBC 7.2 4.0 - 10.5 K/uL   RBC 4.06 3.87 - 5.11 MIL/uL   Hemoglobin 12.8 12.0 - 15.0 g/dL   HCT 38.4 36.0 - 46.0 %   MCV 94.6 80.0 - 100.0 fL   MCH 31.5 26.0 - 34.0 pg   MCHC 33.3 30.0 - 36.0 g/dL   RDW 12.5 11.5 - 15.5 %   Platelets 243 150 - 400 K/uL   nRBC 0.0 0.0 - 0.2 %    Comment: Performed at [facility], [place] [address] [place], [place] [address]  Rapid urine drug screen (hospital performed)     Status: None   Collection Time: 10/09/22  9:09 PM  Result Value Ref Range   Opiates NONE DETECTED NONE DETECTED   Cocaine NONE DETECTED NONE DETECTED   Benzodiazepines NONE DETECTED NONE DETECTED   Amphetamines NONE DETECTED NONE DETECTED   Tetrahydrocannabinol NONE DETECTED NONE DETECTED   Barbiturates NONE DETECTED NONE DETECTED    Comment: (NOTE) DRUG SCREEN FOR MEDICAL PURPOSES ONLY.  IF CONFIRMATION IS NEEDED FOR ANY PURPOSE, NOTIFY LAB WITHIN 5 DAYS.  LOWEST DETECTABLE  LIMITS FOR URINE DRUG SCREEN Drug Class                     Cutoff (ng/mL) Amphetamine and metabolites    1000 Barbiturate and metabolites    200 Benzodiazepine                 200 Opiates and metabolites        300 Cocaine and metabolites        300 THC                            50 Performed at [facility], [place] [address] [place], [place] [address]   Urinalysis, Routine w reflex microscopic -Urine, Clean Catch     Status: Abnormal   Collection Time: 10/10/22 11:24 AM  Result Value Ref Range   Color, Urine YELLOW YELLOW   APPearance CLEAR CLEAR   Specific Gravity, Urine 1.006 1.005 - 1.030   pH 6.0 5.0 - 8.0   Glucose, UA NEGATIVE NEGATIVE mg/dL   Hgb urine dipstick NEGATIVE NEGATIVE   Bilirubin Urine NEGATIVE NEGATIVE   Ketones, ur NEGATIVE NEGATIVE mg/dL   Protein, ur NEGATIVE NEGATIVE mg/dL   Nitrite NEGATIVE NEGATIVE   Leukocytes,Ua TRACE (A) NEGATIVE   RBC / HPF 0-5 0 - 5 RBC/hpf   WBC, UA 0-5 0 - 5 WBC/hpf   Bacteria, UA RARE (A) NONE SEEN   Squamous Epithelial / HPF 0-5 0 - 5 /HPF   Mucus PRESENT     Comment: Performed at [facility], [place] [address], [place], [place] [address]  Resp panel by RT-PCR (RSV, Flu A&B, Covid) Anterior Nasal Swab     Status: None   Collection Time: 10/10/22 12:35 PM   Specimen: Anterior Nasal Swab  Result Value Ref Range   SARS Coronavirus 2 by RT PCR NEGATIVE NEGATIVE    Comment: (NOTE) SARS-CoV-2 target nucleic acids are NOT DETECTED.  The SARS-CoV-2 RNA is generally detectable in upper respiratory specimens during the acute phase of infection. The lowest concentration of SARS-CoV-2 viral copies this assay can detect is 138 copies/mL. A negative result does not preclude SARS-Cov-2 infection and should not be used as the sole basis for treatment or other patient management decisions. A negative result may occur with  improper specimen collection/handling, submission of specimen other than  nasopharyngeal swab, presence of viral mutation(s) within the areas targeted by this assay, and inadequate number of viral copies(<138 copies/mL). A negative result must be combined with clinical observations, patient history, and epidemiological information. The expected result is Negative.  Fact Sheet for Patients:  EntrepreneurPulse.com.au  Fact Sheet for Healthcare Providers:  IncredibleEmployment.be  This test is no t yet approved or cleared by the [place] FDA and  has been authorized for detection and/or diagnosis of SARS-CoV-2 by FDA under an Emergency Use Authorization (EUA). This EUA will remain  in effect (meaning this test can be used) for the duration of the COVID-19 declaration under Section 564(b)(1) of the Act, 21 U.S.C.section 360bbb-3(b)(1), unless the authorization is terminated  or revoked sooner.       Influenza A by PCR NEGATIVE NEGATIVE   Influenza B by PCR NEGATIVE NEGATIVE    Comment: (NOTE) The Xpert Xpress SARS-CoV-2/FLU/RSV plus assay is intended as an aid in the diagnosis of influenza from Nasopharyngeal swab specimens and should not be used as a sole basis for treatment. Nasal  washings and aspirates are unacceptable for Xpert Xpress SARS-CoV-2/FLU/RSV testing.  Fact Sheet for Patients: EntrepreneurPulse.com.au  Fact Sheet for Healthcare Providers: IncredibleEmployment.be  This test is not yet approved or cleared by the [place] FDA and has been authorized for detection and/or diagnosis of SARS-CoV-2 by FDA under an Emergency Use Authorization (EUA). This EUA will remain in effect (meaning this test can be used) for the duration of the COVID-19 declaration under Section 564(b)(1) of the Act, 21 U.S.C. section 360bbb-3(b)(1), unless the authorization is terminated or revoked.     Resp Syncytial Virus by PCR NEGATIVE NEGATIVE    Comment: (NOTE) Fact Sheet for  Patients: EntrepreneurPulse.com.au  Fact Sheet for Healthcare Providers: IncredibleEmployment.be  This test is not yet approved or cleared by the [place] FDA and has been authorized for detection and/or diagnosis of SARS-CoV-2 by FDA under an Emergency Use Authorization (EUA). This EUA will remain in effect (meaning this test can be used) for the duration of the COVID-19 declaration under Section 564(b)(1) of the Act, 21 U.S.C. section 360bbb-3(b)(1), unless the authorization is terminated or revoked.  Performed at [facility], [place] [address] [place], [place] [address]     Blood Alcohol level:  Lab Results  Component Value Date   ETH <10 10/09/2022   ETH <10 [id number]    Metabolic Disorder Labs:  No results found for: "HGBA1C", "MPG" No results found for: "PROLACTIN" No results found for: "CHOL", "TRIG", "HDL", "CHOLHDL", "VLDL", "LDLCALC"  Current Medications: Current Facility-Administered Medications  Medication Dose Route Frequency Provider Last Rate Last Admin   acetaminophen (TYLENOL) tablet 650 mg  650 mg Oral Q6H PRN Patrecia Pour, NP       alum & mag hydroxide-simeth (MAALOX/MYLANTA) 200-200-20 MG/5ML suspension 30 mL  30 mL Oral Q4H PRN Patrecia Pour, NP       buPROPion (WELLBUTRIN XL) 24 hr tablet 300 mg  300 mg Oral q morning Patrecia Pour, NP   300 mg at 10/11/22 0940   clonazePAM (KLONOPIN) tablet 1 mg  1 mg Oral BID Tuwanda Vokes, Madie Reno, MD       feeding supplement (ENSURE ENLIVE / ENSURE PLUS) liquid 237 mL  237 mL Oral BID BM Yaelis Scharfenberg, Madie Reno, MD       FLUoxetine (PROZAC) capsule 20 mg  20 mg Oral QODAY Jolette Lana, Madie Reno, MD       Derrill Memo ON 10/12/2022] FLUoxetine (PROZAC) capsule 40 mg  40 mg Oral QODAY Tayleigh Wetherell T, MD       lisinopril (ZESTRIL) tablet 10 mg  10 mg Oral Daily Patrecia Pour, NP   10 mg at 10/11/22 0940   magnesium hydroxide (MILK OF MAGNESIA) suspension 30 mL  30 mL Oral Daily  PRN Patrecia Pour, NP       mirtazapine (REMERON) tablet 30 mg  30 mg Oral QHS Brailey Buescher, Madie Reno, MD       multivitamin with minerals tablet 1 tablet  1 tablet Oral Daily Javien Tesch, Madie Reno, MD   1 tablet at 10/11/22 0940   OLANZapine (ZYPREXA) tablet 5 mg  5 mg Oral BID PRN Patrecia Pour, NP       QUEtiapine (SEROQUEL) tablet 50 mg  50 mg Oral QHS Patrecia Pour, NP       PTA Medications: Medications Prior to Admission  Medication Sig Dispense Refill Last Dose   buPROPion (WELLBUTRIN XL) 300 MG 24 hr tablet Take 300 mg by mouth every morning.  cycloSPORINE (RESTASIS) 0.05 % ophthalmic emulsion Place 1 drop into both eyes daily.      FLUoxetine (PROZAC) 20 MG capsule Take 20-40 mg by mouth See admin instructions. Takes 20 mg and 40 mg on alternate days.      lisinopril (ZESTRIL) 10 MG tablet Take 10 mg by mouth daily.      QUEtiapine (SEROQUEL) 50 MG tablet Take 50 mg by mouth at bedtime.       Musculoskeletal: Strength & Muscle Tone: within normal limits Gait & Station: normal Patient leans: N/A            Psychiatric Specialty Exam:  Presentation  General Appearance:  Casual; Well Groomed  Eye Contact: Good  Speech: Clear and Coherent; Normal Rate  Speech Volume: Normal  Handedness: Right   Mood and Affect  Mood: Anxious; Depressed; Hopeless  Affect: Congruent; Depressed   Thought Process  Thought Processes: Coherent; Goal Directed; Linear  Duration of Psychotic Symptoms:N/A Past Diagnosis of Schizophrenia or Psychoactive disorder: No data recorded Descriptions of Associations:Intact  Orientation:Full (Time, Place and Person)  Thought Content:Logical  Hallucinations:Hallucinations: None  Ideas of Reference:None  Suicidal Thoughts:Suicidal Thoughts: Yes, Passive  Homicidal Thoughts:Homicidal Thoughts: No   Sensorium  Memory: Immediate Good; Recent Good; Remote Good  Judgment: Impaired  Insight: Fair   Community education officer   Concentration: Good  Attention Span: Good  Recall: Good  Fund of Knowledge: Good  Language: Good   Psychomotor Activity  Psychomotor Activity: Psychomotor Activity: Normal   Assets  Assets: Communication Skills; Desire for Improvement; Financial Resources/Insurance; Housing; Intimacy; Physical Health   Sleep  Sleep: Sleep: Fair    Physical Exam: Physical Exam Vitals and nursing note reviewed.  Constitutional:      Appearance: Normal appearance.  HENT:     Head: Normocephalic and atraumatic.     Mouth/Throat:     Pharynx: Oropharynx is clear.  Eyes:     Pupils: Pupils are equal, round, and reactive to light.  Cardiovascular:     Rate and Rhythm: Normal rate and regular rhythm.  Pulmonary:     Effort: Pulmonary effort is normal.     Breath sounds: Normal breath sounds.  Abdominal:     General: Abdomen is flat.     Palpations: Abdomen is soft.  Musculoskeletal:        General: Normal range of motion.  Skin:    General: Skin is warm and dry.  Neurological:     General: No focal deficit present.     Mental Status: She is alert. Mental status is at baseline.  Psychiatric:        Attention and Perception: Attention normal.        Mood and Affect: Mood is anxious and depressed. Affect is blunt.        Speech: Speech is delayed.        Behavior: Behavior is slowed.        Thought Content: Thought content includes suicidal ideation. Thought content does not include suicidal plan.        Cognition and Memory: Cognition normal.    Review of Systems  Constitutional:  Positive for malaise/fatigue.  HENT: Negative.    Eyes: Negative.   Respiratory: Negative.    Cardiovascular: Negative.   Gastrointestinal: Negative.   Musculoskeletal: Negative.   Skin: Negative.   Neurological: Negative.   Psychiatric/Behavioral:  Positive for depression and suicidal ideas. Negative for hallucinations and substance abuse. The patient is nervous/anxious and has insomnia.     Blood  pressure (!) 143/99, pulse 61, temperature 98 F (36.7 C), temperature source Oral, resp. rate 16, height 5' 2"$  (1.575 m), weight 49.4 kg, SpO2 96 %. Body mass index is 19.94 kg/m.  Treatment Plan Summary: Daily contact with patient to assess and evaluate symptoms and progress in treatment, Medication management, and Plan reviewed medications.  I am going to reorder the bupropion fluoxetine clonazepam as usually prescribed.  I am hoping the pharmacy can help me to figure out how to put in the alternating Prozac schedule.  I am going to increase mirtazapine to 30 mg at night from 15 for better antidepressant response.  Continue blood pressure medicine.  Labs reviewed.  Ordered hemoglobin A1c and lipid panel because she is also taking a small dose of Seroquel which was recently started.  Discussed with the patient ECT.  She would be an appropriate candidate and has no contraindication for it.  Discussed it in detail of the side effects and the current procedure.  Did not push patient to make a decision.  Also reviewed this with the daughter-in-law.  I suggest we continue to discuss this with him.  Meanwhile continue 15-minute checks and usual treatment with full treatment team evaluation.  Observation Level/Precautions:  15 minute checks  Laboratory:  Chemistry Profile  Psychotherapy:    Medications:    Consultations:    Discharge Concerns:    Estimated LOS:  Other:     Physician Treatment Plan for Primary Diagnosis: Major depressive disorder, recurrent severe without psychotic features ([place]) Long Term Goal(s): Improvement in symptoms so as ready for discharge  Short Term Goals: Ability to verbalize feelings will improve, Ability to disclose and discuss suicidal ideas, Ability to demonstrate self-control will improve, and Ability to identify and develop effective coping behaviors will improve  Physician Treatment Plan for Secondary Diagnosis: Principal Problem:   Major depressive  disorder, recurrent severe without psychotic features ([place])  Long Term Goal(s): Improvement in symptoms so as ready for discharge  Short Term Goals: Compliance with prescribed medications will improve  I certify that inpatient services furnished can reasonably be expected to improve the patient's condition.    Alethia Berthold, MD 2/20/202410:52 AM

## 2022-10-11 NOTE — BHH Counselor (Signed)
CSW made first attempt to complete pt's PSA.  Pt stated "I don't feel like doing the assessment today."   CSW left pt's room and will follow up at another more opportune time to complete PSA.   Kejon Feild [place], MSW, LCSW-A 2/20/20241:26 PM

## 2022-10-11 NOTE — Progress Notes (Signed)
Pt ate all of dinner from new meal tray that was ordered (toasted chicken salad sandwich, tomato soup, chocolate chip cookies, and sweet tea).

## 2022-10-11 NOTE — BH Assessment (Signed)
Recreation Therapy Notes  INPATIENT RECREATION THERAPY ASSESSMENT  Patient Details Name: Caitlyn [name] MRN: [id number] DOB: [birth year] Today's Date: 10/11/2022       Information Obtained From: Patient (In addition to chart review)  Able to Participate in Assessment/Interview: Yes  Patient Presentation: Withdrawn, Anxious (Restricted)  Reason for Admission (Per Patient): Active Symptoms ("I was depressed and had some anxiety but the depression is worse." Per chart review, SI.)  Patient Stressors: Family ("My husband is in bad health". Per chart review, the move from [facility] to [facility].)  Coping Skills:   Isolation, Deep Breathing  Leisure Interests (2+):  Art - Paint, Sports - Other (Comment) ("any sport with a ball and racket")  Frequency of Recreation/Participation: Other (Comment) ("not real often anymore".)  Awareness of Community Resources:  Yes  Community Resources:  [place] in [place] for years")  Current Use: No  If no, Barriers?:  (N/A)  Expressed Interest in [place]: No  County of Residence:  [place]  Patient Main Form of Transportation: Car  Patient Strengths:  "I am good at reading books and I use to play the piano".  Patient Identified Areas of Improvement:  "Live more happily"  Patient Goal for Hospitalization:  "Leave happier"  Current SI (including self-harm):  No  Current HI:  No  Current AVH: No  Staff Intervention Plan: Group Attendance, Collaborate with Interdisciplinary Treatment Team  Consent to Intern Participation: N/A   Juline Patch LRT, CTRS  Germain Koopmann E Jabin Tapp 10/11/2022, 9:10 AM

## 2022-10-12 DIAGNOSIS — F332 Major depressive disorder, recurrent severe without psychotic features: Secondary | ICD-10-CM | POA: Diagnosis not present

## 2022-10-12 LAB — LIPID PANEL
Cholesterol: 215 mg/dL — ABNORMAL HIGH (ref 0–200)
HDL: 87 mg/dL (ref >40)
LDL Cholesterol: 112 mg/dL — ABNORMAL HIGH (ref 0–99)
Total CHOL/HDL Ratio: 2.5 RATIO
Triglycerides: 78 mg/dL (ref <150)
VLDL: 16 mg/dL (ref 0–40)

## 2022-10-12 LAB — HEMOGLOBIN A1C
Hgb A1c MFr Bld: 4.9 % (ref 4.8–5.6)
Mean Plasma Glucose: 93.93 mg/dL

## 2022-10-12 MED ORDER — MIRTAZAPINE 15 MG PO TABS
15.0000 mg | ORAL_TABLET | Freq: Every day | ORAL | Status: DC
  Administered 2022-10-12: 15 mg via ORAL
  Filled 2022-10-12: qty 1

## 2022-10-12 NOTE — BHH Counselor (Signed)
Adult Comprehensive Assessment  Patient ID: Caitlyn [name], female   DOB: 01/24/1946, 77 y.o.   MRN: [id number]  Information Source: Information source: Patient  Current Stressors:  Patient states their primary concerns and needs for treatment are:: "Feeling depressed" Patient states their goals for this hospitilization and ongoing recovery are:: "Going home" Educational / Learning stressors: Pt denies Employment / Job issues: Retired Family Relationships: Pt states that her husband's health is a Producer, television/film/video / Lack of resources (include bankruptcy): I don't care Housing / Lack of housing: Pt denies Physical health (include injuries & life threatening diseases): Pt denies Social relationships: Pt denies Substance abuse: Pt denies Bereavement / Loss: Pt denies  Living/Environment/Situation:  Living Arrangements: Spouse/significant other Living conditions (as described by patient or guardian): Living in a retirement community, [employer] How long has patient lived in current situation?: been in [place] about a year What is atmosphere in current home: Comfortable  Family History:  Marital status: Married Number of Years Married: 53 What types of issues is patient dealing with in the relationship?: Health issues Are you sexually active?:  (unable to assess) What is your sexual orientation?: Heterosexual Has your sexual activity been affected by drugs, alcohol, medication, or emotional stress?: Pt denies Does patient have children?: Yes How many children?: 2 (sons) How is patient's relationship with their children?: Pt states that she has a good relationship with her children  Childhood History:  By whom was/is the patient raised?: Both parents Additional childhood history information: "they worked all the time" Description of patient's relationship with caregiver when they were a child: Pt states that she had a good relationship Patient's description of current relationship  with people who raised him/her: Deceased How were you disciplined when you got in trouble as a child/adolescent?: spanked Does patient have siblings?: Yes Number of Siblings: 1 Description of patient's current relationship with siblings: "we're not close" Did patient suffer any verbal/emotional/physical/sexual abuse as a child?: No Did patient suffer from severe childhood neglect?: No Has patient ever been sexually abused/assaulted/raped as an adolescent or adult?: No Was the patient ever a victim of a crime or a disaster?: No Witnessed domestic violence?: No Has patient been affected by domestic violence as an adult?: No  Education:  Highest grade of school patient has completed: bachelor's Currently a Ship broker?: No Learning disability?: No  Employment/Work Situation:   Employment Situation: Retired Social research officer, government has Been Impacted by Current Illness: No What is the Longest Time Patient has Held a Job?: Pt states that she has worked for many years 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, 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, 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 Has Patient ever Been in the [employer]?: No  Financial Resources:   Financial resources: Receives SSI Does patient have a Programmer, applications or guardian?: No  Alcohol/Substance Abuse:   What has been your use of drugs/alcohol within the last 12 months?: Pt denies If attempted suicide, did drugs/alcohol play a role in this?: No Alcohol/Substance Abuse Treatment Hx: Denies past history Has alcohol/substance abuse ever caused legal problems?: No  Social Support System:   Heritage manager System: Manufacturing engineer System: Husband, daughter in Sports coach, sons Type of faith/religion: Methodist How does patient's faith help to cope with current illness?: "I feel like I pray alot because I worry alot"  Leisure/Recreation:   Do You Have Hobbies?: Yes Leisure and Hobbies: She states that she has activities at her  retirement community  Strengths/Needs:  What is the patient's perception of their strengths?: "Active, used to play piano and tennis and sing in a chorus" Patient states they can use these personal strengths during their treatment to contribute to their recovery: Pt states she wants to find motivation again and get better Patient states these barriers may affect/interfere with their treatment: Pt denies Patient states these barriers may affect their return to the community: Pt denies  Discharge Plan:   Currently receiving community mental health services: Yes (From Whom) (Dr. Reece Levy) Patient states concerns and preferences for aftercare planning are: Pt states that she is interested in starting therapy at her provider's psychiatry practice Patient states they will know when they are safe and ready for discharge when: "Being happier" Does patient have access to transportation?: Yes Does patient have financial barriers related to discharge medications?: No Will patient be returning to same living situation after discharge?: Yes  Summary/Recommendations:   Summary and Recommendations (to be completed by the evaluator): Patient is a 77 year old female, married,  [place] [facility]  She reports that she receives SSI and is currently retired. She presents to the hospital following suicidal ideation and depression. Patient had recent admission at [facility] in December. Recent stressors include a hx of depression and SI, current depressive symptoms, recent transition to a retirement community and worry about spouse's health challenges. She has a primary diagnosis of Major depressive disorder, recurrent severe without psychotic features. Patient has follow up with Dr. Reece Levy for medication management and is open to referral for therapy.  Recommendations include: crisis stabilization, therapeutic milieu, encourage group attendance and participation, medication management for mood stabilization  and development of comprehensive mental wellness plan.  Tanaja Ganger A [place]. 10/12/2022

## 2022-10-12 NOTE — Progress Notes (Signed)
   10/11/22 2100  Psych Admission Type (Psych Patients Only)  Admission Status Voluntary  Psychosocial Assessment  Patient Complaints Anxiety;Depression  Eye Contact Fair  Facial Expression Anxious  Affect Apathetic  Speech Logical/coherent  Interaction Assertive  Motor Activity Other (Comment) (WNL)  Appearance/Hygiene Unremarkable  Behavior Characteristics Cooperative  Mood Anxious  Thought Process  Coherency WDL  Content WDL  Delusions None reported or observed  Perception WDL  Hallucination None reported or observed  Judgment Limited  Confusion None  Danger to Self  Current suicidal ideation? Denies  Agreement Not to Harm Self Yes  Description of Agreement Verbal  Danger to Others  Danger to Others None reported or observed

## 2022-10-12 NOTE — Progress Notes (Signed)
Patient denies SI, HI, AVH, and pain. Patient endorses depression and anxiety and rates it as a 7/10. She feels she has not had enough time for it to improve yet. She denies medication side effects. Patient comes to the dayroom occasionally and is seen eating and doing puzzles. Patient remains safe on the unit at this time.

## 2022-10-12 NOTE — Progress Notes (Signed)
[facility] MD Progress Note  10/12/2022 6:50 AM Caitlyn [name]  MRN:  [id number]  Subjective:  77 yo female admitted for sever depression and an increase in suicidal ideations.  Her depression is still high with passive suicidal ideations at times.  Anxiety is moderate.  Her sleep was good last night with the Seroquel.  Appetite is decreased with a recent weight loss of five pounds.  No psychosis, paranoia, or substance abuse.  She is considering ECT, has not decided yet.  Principal Problem: Major depressive disorder, recurrent severe without psychotic features ([place]) Diagnosis: Principal Problem:   Major depressive disorder, recurrent severe without psychotic features ([place])  Total Time spent with patient: 30 minutes  Past Psychiatric History: depression, anxiety  Past Medical History:  Past Medical History:  Diagnosis Date   Anxiety    Depression    Hypertension     Past Surgical History:  Procedure Laterality Date   EYE SURGERY     Family History:  Family History  Problem Relation Age of Onset   Breast cancer Neg Hx    Family Psychiatric  History: none Social History:  Social History   Substance and Sexual Activity  Alcohol Use Never     Social History   Substance and Sexual Activity  Drug Use Never    Social History   Socioeconomic History   Marital status: Married    Spouse name: Not on file   Number of children: Not on file   Years of education: Not on file   Highest education level: Not on file  Occupational History   Not on file  Tobacco Use   Smoking status: Never   Smokeless tobacco: Never  Vaping Use   Vaping Use: Never used  Substance and Sexual Activity   Alcohol use: Never   Drug use: Never   Sexual activity: Not on file  Other Topics Concern   Not on file  Social History Narrative   Not on file   Social Determinants of Health   Financial Resource Strain: Not on file  Food Insecurity: No Food Insecurity (10/10/2022)   Hunger Vital Sign     Worried About Running Out of Food in the Last Year: Never true    Ran Out of Food in the Last Year: Never true  Transportation Needs: No Transportation Needs (10/10/2022)   PRAPARE - Hydrologist (Medical): No    Lack of Transportation (Non-Medical): No  Physical Activity: Not on file  Stress: Not on file  Social Connections: Not on file   Additional Social History:     Sleep: Good  Appetite:  Poor  Current Medications: Current Facility-Administered Medications  Medication Dose Route Frequency Provider Last Rate Last Admin   acetaminophen (TYLENOL) tablet 650 mg  650 mg Oral Q6H PRN Patrecia Pour, NP       alum & mag hydroxide-simeth (MAALOX/MYLANTA) 200-200-20 MG/5ML suspension 30 mL  30 mL Oral Q4H PRN Patrecia Pour, NP       buPROPion (WELLBUTRIN XL) 24 hr tablet 300 mg  300 mg Oral q morning Patrecia Pour, NP   300 mg at 10/11/22 0940   clonazePAM (KLONOPIN) tablet 1 mg  1 mg Oral BID Clapacs, John T, MD   1 mg at 10/11/22 2059   feeding supplement (ENSURE ENLIVE / ENSURE PLUS) liquid 237 mL  237 mL Oral BID BM Clapacs, John T, MD       FLUoxetine (PROZAC) capsule 20 mg  20  mg Oral QODAY Clapacs, Madie Reno, MD   20 mg at 10/11/22 1221   FLUoxetine (PROZAC) capsule 40 mg  40 mg Oral QODAY Clapacs, John T, MD       lisinopril (ZESTRIL) tablet 10 mg  10 mg Oral Daily Patrecia Pour, NP   10 mg at 10/11/22 0940   magnesium hydroxide (MILK OF MAGNESIA) suspension 30 mL  30 mL Oral Daily PRN Patrecia Pour, NP       mirtazapine (REMERON) tablet 30 mg  30 mg Oral QHS Clapacs, Madie Reno, MD   30 mg at 10/11/22 2100   multivitamin with minerals tablet 1 tablet  1 tablet Oral Daily Clapacs, Madie Reno, MD   1 tablet at 10/11/22 0940   OLANZapine (ZYPREXA) tablet 5 mg  5 mg Oral BID PRN Patrecia Pour, NP   5 mg at 10/11/22 1646   QUEtiapine (SEROQUEL) tablet 50 mg  50 mg Oral QHS Patrecia Pour, NP   50 mg at 10/11/22 2059    Lab Results:  Results for orders  placed or performed during the hospital encounter of 10/09/22 (from the past 48 hour(s))  Urinalysis, Routine w reflex microscopic -Urine, Clean Catch     Status: Abnormal   Collection Time: 10/10/22 11:24 AM  Result Value Ref Range   Color, Urine YELLOW YELLOW   APPearance CLEAR CLEAR   Specific Gravity, Urine 1.006 1.005 - 1.030   pH 6.0 5.0 - 8.0   Glucose, UA NEGATIVE NEGATIVE mg/dL   Hgb urine dipstick NEGATIVE NEGATIVE   Bilirubin Urine NEGATIVE NEGATIVE   Ketones, ur NEGATIVE NEGATIVE mg/dL   Protein, ur NEGATIVE NEGATIVE mg/dL   Nitrite NEGATIVE NEGATIVE   Leukocytes,Ua TRACE (A) NEGATIVE   RBC / HPF 0-5 0 - 5 RBC/hpf   WBC, UA 0-5 0 - 5 WBC/hpf   Bacteria, UA RARE (A) NONE SEEN   Squamous Epithelial / HPF 0-5 0 - 5 /HPF   Mucus PRESENT     Comment: Performed at [facility], [place] [address] [place], [place] [address]  Resp panel by RT-PCR (RSV, Flu A&B, Covid) Anterior Nasal Swab     Status: None   Collection Time: 10/10/22 12:35 PM   Specimen: Anterior Nasal Swab  Result Value Ref Range   SARS Coronavirus 2 by RT PCR NEGATIVE NEGATIVE    Comment: (NOTE) SARS-CoV-2 target nucleic acids are NOT DETECTED.  The SARS-CoV-2 RNA is generally detectable in upper respiratory specimens during the acute phase of infection. The lowest concentration of SARS-CoV-2 viral copies this assay can detect is 138 copies/mL. A negative result does not preclude SARS-Cov-2 infection and should not be used as the sole basis for treatment or other patient management decisions. A negative result may occur with  improper specimen collection/handling, submission of specimen other than nasopharyngeal swab, presence of viral mutation(s) within the areas targeted by this assay, and inadequate number of viral copies(<138 copies/mL). A negative result must be combined with clinical observations, patient history, and epidemiological information. The expected result is  Negative.  Fact Sheet for Patients:  EntrepreneurPulse.com.au  Fact Sheet for Healthcare Providers:  IncredibleEmployment.be  This test is no t yet approved or cleared by the [place] FDA and  has been authorized for detection and/or diagnosis of SARS-CoV-2 by FDA under an Emergency Use Authorization (EUA). This EUA will remain  in effect (meaning this test can be used) for the duration of the COVID-19 declaration under Section 564(b)(1) of the  Act, 21 U.S.C.section 360bbb-3(b)(1), unless the authorization is terminated  or revoked sooner.       Influenza A by PCR NEGATIVE NEGATIVE   Influenza B by PCR NEGATIVE NEGATIVE    Comment: (NOTE) The Xpert Xpress SARS-CoV-2/FLU/RSV plus assay is intended as an aid in the diagnosis of influenza from Nasopharyngeal swab specimens and should not be used as a sole basis for treatment. Nasal washings and aspirates are unacceptable for Xpert Xpress SARS-CoV-2/FLU/RSV testing.  Fact Sheet for Patients: EntrepreneurPulse.com.au  Fact Sheet for Healthcare Providers: IncredibleEmployment.be  This test is not yet approved or cleared by the [place] FDA and has been authorized for detection and/or diagnosis of SARS-CoV-2 by FDA under an Emergency Use Authorization (EUA). This EUA will remain in effect (meaning this test can be used) for the duration of the COVID-19 declaration under Section 564(b)(1) of the Act, 21 U.S.C. section 360bbb-3(b)(1), unless the authorization is terminated or revoked.     Resp Syncytial Virus by PCR NEGATIVE NEGATIVE    Comment: (NOTE) Fact Sheet for Patients: EntrepreneurPulse.com.au  Fact Sheet for Healthcare Providers: IncredibleEmployment.be  This test is not yet approved or cleared by the [place] FDA and has been authorized for detection and/or diagnosis of SARS-CoV-2 by FDA under an  Emergency Use Authorization (EUA). This EUA will remain in effect (meaning this test can be used) for the duration of the COVID-19 declaration under Section 564(b)(1) of the Act, 21 U.S.C. section 360bbb-3(b)(1), unless the authorization is terminated or revoked.  Performed at [facility], [place] [address] [place], [place] [address]     Blood Alcohol level:  Lab Results  Component Value Date   ETH <10 10/09/2022   ETH <10 [id number]    Metabolic Disorder Labs: No results found for: "HGBA1C", "MPG" No results found for: "PROLACTIN" No results found for: "CHOL", "TRIG", "HDL", "CHOLHDL", "VLDL", "LDLCALC"   Musculoskeletal: Strength & Muscle Tone: within normal limits Gait & Station: normal Patient leans: N/A  Psychiatric Specialty Exam: Physical Exam Vitals and nursing note reviewed.  Constitutional:      Appearance: Normal appearance.  HENT:     Head: Normocephalic.     Nose: Nose normal.  Pulmonary:     Effort: Pulmonary effort is normal.  Musculoskeletal:        General: Normal range of motion.     Cervical back: Normal range of motion.  Neurological:     General: No focal deficit present.     Mental Status: She is alert and oriented to person, place, and time.  Psychiatric:        Attention and Perception: Attention and perception normal.        Mood and Affect: Mood is anxious and depressed.        Speech: Speech normal.        Behavior: Behavior normal. Behavior is cooperative.        Thought Content: Thought content includes suicidal ideation.        Cognition and Memory: Cognition and memory normal.        Judgment: Judgment normal.     Review of Systems  Psychiatric/Behavioral:  Positive for depression. The patient is nervous/anxious.   All other systems reviewed and are negative.   Blood pressure 127/77, pulse 68, temperature 98.7 F (37.1 C), temperature source Oral, resp. rate 18, height 5' 2"$  (1.575 m), weight 49.4 kg, SpO2 97  %.Body mass index is 19.94 kg/m.  General Appearance: Casual  Eye Contact:  Good  Speech:  Normal Rate  Volume:  Normal  Mood:  Anxious and Depressed  Affect:  Congruent  Thought Process:  Coherent and Descriptions of Associations: Intact  Orientation:  Full (Time, Place, and Person)  Thought Content:  WDL and Logical  Suicidal Thoughts:  No  Homicidal Thoughts:  No  Memory:  Immediate;   Good Recent;   Good Remote;   Good  Judgement:  Fair  Insight:  Fair  Psychomotor Activity:  Normal  Concentration:  Concentration: Good and Attention Span: Good  Recall:  Good  Fund of Knowledge:  Good  Language:  Good  Akathisia:  No  Handed:  Right  AIMS (if indicated):     Assets:  Housing Leisure Time Physical Health Resilience Social Support  ADL's:  Intact  Cognition:  WNL  Sleep:         Physical Exam: Physical Exam Vitals and nursing note reviewed.  Constitutional:      Appearance: Normal appearance.  HENT:     Head: Normocephalic.     Nose: Nose normal.  Pulmonary:     Effort: Pulmonary effort is normal.  Musculoskeletal:        General: Normal range of motion.     Cervical back: Normal range of motion.  Neurological:     General: No focal deficit present.     Mental Status: She is alert and oriented to person, place, and time.  Psychiatric:        Attention and Perception: Attention and perception normal.        Mood and Affect: Mood is anxious and depressed.        Speech: Speech normal.        Behavior: Behavior normal. Behavior is cooperative.        Thought Content: Thought content includes suicidal ideation.        Cognition and Memory: Cognition and memory normal.        Judgment: Judgment normal.    Review of Systems  Psychiatric/Behavioral:  Positive for depression. The patient is nervous/anxious.   All other systems reviewed and are negative.  Blood pressure 127/77, pulse 68, temperature 98.7 F (37.1 C), temperature source Oral, resp. rate 18,  height 5' 2"$  (1.575 m), weight 49.4 kg, SpO2 97 %. Body mass index is 19.94 kg/m.  Treatment Plan Summary: Daily contact with patient to assess and evaluate symptoms and progress in treatment, Medication management, and Plan : Major depressive disorder, recurrent, severe without psychosis: Prozac 60 mg daily Wellbutrin 300 mg daily Considering ECT  Insomnia Seroquel 50 mg daily Remeron 30 mg daily at bedtime reduced to 15 mg as the lower amounts target the histamine receptor better  Anxiety: Klonopin 1 mg BID  Waylan Boga, NP 10/12/2022, 6:50 AM

## 2022-10-12 NOTE — BHH Group Notes (Signed)
[place] Group Notes:  (Nursing/MHT/Case Management/Adjunct)  Date:  10/12/2022  Time:  11:41 AM  Type of Therapy:   Community Group  Participation Level:  Active  Participation Quality:  Appropriate and Attentive  Affect:  Appropriate  Cognitive:  Alert and Appropriate  Insight:  Appropriate  Engagement in Group:  Engaged  Modes of Intervention:  Activity and Discussion  Summary of Progress/Problems:  Hosie Spangle 10/12/2022, 11:41 AM

## 2022-10-12 NOTE — BHH Suicide Risk Assessment (Signed)
BHH INPATIENT:  Family/Significant Other Suicide Prevention Education  Suicide Prevention Education:  Education Completed; Laurena Fenter, daughter-in-law, [phone]  has been identified by the patient as the family member/significant other with whom the patient will be residing, and identified as the person(s) who will aid the patient in the event of a mental health crisis (suicidal ideations/suicide attempt).  With written consent from the patient, the family member/significant other has been provided the following suicide prevention education, prior to the and/or following the discharge of the patient.  She stated that the patient is a danger to herself because she has been actively suicidal with a plan currently. She stated that pt was isolated which might have been Covid back in January and while isolated was not taking medication as prescribed. She said that in the past pt has gone through depressive episodes but she has recovered but in the last couple years pt's depression has not "lifted" and pt may be at a new baseline. She also stated that pt's partner has had too many health challenges to be able to care for pt when she is in a depressive state; which both factors have prompted the pt and pt's husbands move to their assisted living facility.  She stated that the transition has been challenging. She said the pt's family is also interested in pt starting ECT but is not sure that pt is open to it.   The suicide prevention education provided includes the following: Suicide risk factors Suicide prevention and interventions National Suicide Hotline telephone number [facility] assessment telephone number [facility] Emergency Assistance [place] and/or Residential Mobile Crisis Unit telephone number  Request made of family/significant other to: Remove weapons (e.g., guns, rifles, knives), all items previously/currently identified as safety concern.   Remove  drugs/medications (over-the-counter, prescriptions, illicit drugs), all items previously/currently identified as a safety concern.  The family member/significant other verbalizes understanding of the suicide prevention education information provided.  The family member/significant other agrees to remove the items of safety concern listed above.  Eunique Balik A Martinique 10/12/2022, 2:36 PM

## 2022-10-12 NOTE — Group Note (Signed)
Recreation Therapy Group Note   Group Topic:Relaxation  Group Date: 10/12/2022 Start Time: 1400 End Time: 1410 Facilitators: Vilma Prader, LRT, CTRS Location:  Dayroom  Group Description: PMR (Progressive Muscle Relaxation). LRT asks patients their current level of stress/anxiety from 1-10, with 10 being the highest. LRT educates patients on what PMR is and the benefits that come from it. Patients are asked to sit with their feet flat on the floor while sitting up and all the way back in their chair, if possible. LRT follows prompt that requires the patients to tense and release different muscles in their body and focus on their breathing. During session, lights are off and soft music is being played. At the end of the prompt, LRT asks patients to rank their current levels of stress/anxiety from 1-10, 10 being the highest.   Affect/Mood: N/A   Participation Level: Did not attend    Clinical Observations/Individualized Feedback: Alante did not attend group. LRT invitied and encouraged pt to get out of bed and come to the dayroom but pt replied with, "we do not have enough people for a group." LRT responded that we could do a smaller session with her, LRT and NT on the floor. Pt still declined and said she would rather stay in her room.   Plan: Continue to engage patient in RT group sessions 2-3x/week.   Vilma Prader, LRT, [place] 10/12/2022 2:33 PM

## 2022-10-13 DIAGNOSIS — F332 Major depressive disorder, recurrent severe without psychotic features: Secondary | ICD-10-CM | POA: Diagnosis not present

## 2022-10-13 MED ORDER — MIRTAZAPINE 15 MG PO TABS
30.0000 mg | ORAL_TABLET | Freq: Every day | ORAL | Status: DC
  Administered 2022-10-13 – 2022-10-17 (×5): 30 mg via ORAL
  Filled 2022-10-13 (×5): qty 2

## 2022-10-13 NOTE — Progress Notes (Signed)
Patient is alert and oriented times 4. Mood and affect appropriate. Patient denies pain. She denies SI, HI, and AVH. Also denies feelings of anxiety and depression at this time. States she slept good last night. Morning meds given whole by mouth W/O difficulty. Ate breakfast in day room- appetite good. Patient remains on unit with Q15 minute checks in place.

## 2022-10-13 NOTE — Group Note (Signed)
Recreation Therapy Group Note   Group Topic:Relaxation  Group Date: 10/13/2022 Start Time: 1400 End Time: 1450 Facilitators: Vilma Prader, LRT, CTRS Location:  Day Room  Group Description: PMR (Progressive Muscle Relaxation). LRT asks patients their current level of stress/anxiety from 1-10, with 10 being the highest. LRT educates patients on what PMR is and the benefits that come from it. Patients are asked to sit with their feet flat on the floor while sitting up and all the way back in their chair, if possible. LRT follows prompt that requires the patients to tense and release different muscles in their body and focus on their breathing. During session, lights are off and soft music is being played. At the end of the prompt, LRT asks patients to rank their current levels of stress/anxiety from 1-10, 10 being the highest.   Affect/Mood: Appropriate   Participation Level: Active and Engaged   Participation Quality: Independent   Behavior: Alert and Appropriate   Speech/Thought Process: Concrete and Coherent   Insight: Good   Judgement: Good   Modes of Intervention: Activity and Education   Patient Response to Interventions:  Attentive, Interested , and Receptive   Education Outcome:  Acknowledges education   Clinical Observations/Individualized Feedback: Caitlyn [name] was active in their participation of session activities and group discussion. Pt identified that her anxiety level was a 7 and stress was a 7 before the session. After the session, she shared that her anxiety and stress are "about the same" as they were before it. Pt shared that she prefers something that is more active and asked LRT if there was anywhere she could walk. LRT shared that she can walk the halls as long as she doesn't go past the red line on the floor or into other pts rooms. Pt verbalized understanding and RN aware.    Plan: Continue to engage patient in RT group sessions 2-3x/week.   Vilma Prader,  LRT, CTRS 10/13/2022 3:00 PM

## 2022-10-13 NOTE — Group Note (Signed)
Date:  10/13/2022 Time:  3:59 AM  Group Topic/Focus:  Coping With Mental Health Crisis:   The purpose of this group is to help patients identify strategies for coping with mental health crisis.  Group discusses possible causes of crisis and ways to manage them effectively. Goals Group:   The focus of this group is to help patients establish daily goals to achieve during treatment and discuss how the patient can incorporate goal setting into their daily lives to aide in recovery.    Participation Level:  Did Not Attend  Participation Quality:      Affect:      Cognitive:      Insight: None  Engagement in Group:      Modes of Intervention:      Additional Comments:    Caitlyn [name] 10/13/2022, 3:59 AM

## 2022-10-13 NOTE — Plan of Care (Signed)
  Problem: Education: Goal: Knowledge of General Education information will improve Description: Including pain rating scale, medication(s)/side effects and non-pharmacologic comfort measures Outcome: Progressing   Problem: Health Behavior/Discharge Planning: Goal: Ability to manage health-related needs will improve Outcome: Progressing   Problem: Clinical Measurements: Goal: Ability to maintain clinical measurements within normal limits will improve Outcome: Progressing Goal: Will remain free from infection Outcome: Progressing Goal: Diagnostic test results will improve Outcome: Progressing Goal: Respiratory complications will improve Outcome: Progressing Goal: Cardiovascular complication will be avoided Outcome: Progressing   Problem: Skin Integrity: Goal: Risk for impaired skin integrity will decrease Outcome: Progressing   Problem: Safety: Goal: Ability to remain free from injury will improve Outcome: Progressing   Problem: Pain Managment: Goal: General experience of comfort will improve Outcome: Progressing   Problem: Coping: Goal: Level of anxiety will decrease Outcome: Progressing

## 2022-10-13 NOTE — Progress Notes (Signed)
[facility] MD Progress Note  10/13/2022 8:54 AM Caitlyn [name]  MRN:  [id number]  Subjective:  77 yo female admitted for severe depression and an increase in suicidal ideations.  Her depression and anxiety are a 7/10 with 10 being the highest with intermittent, passive suicidal ideations at times.  No panic attacks.  Her sleep was good last night with the Seroquel.  Appetite is improving slightly.  No psychosis, paranoia, or substance abuse.  She is considering ECT, has not decided yet.  Principal Problem: Major depressive disorder, recurrent severe without psychotic features ([place]) Diagnosis: Principal Problem:   Major depressive disorder, recurrent severe without psychotic features ([place])  Total Time spent with patient: 30 minutes  Past Psychiatric History: depression, anxiety  Past Medical History:  Past Medical History:  Diagnosis Date   Anxiety    Depression    Hypertension     Past Surgical History:  Procedure Laterality Date   EYE SURGERY     Family History:  Family History  Problem Relation Age of Onset   Breast cancer Neg Hx    Family Psychiatric  History: none Social History:  Social History   Substance and Sexual Activity  Alcohol Use Never     Social History   Substance and Sexual Activity  Drug Use Never    Social History   Socioeconomic History   Marital status: Married    Spouse name: Not on file   Number of children: Not on file   Years of education: Not on file   Highest education level: Not on file  Occupational History   Not on file  Tobacco Use   Smoking status: Never   Smokeless tobacco: Never  Vaping Use   Vaping Use: Never used  Substance and Sexual Activity   Alcohol use: Never   Drug use: Never   Sexual activity: Not on file  Other Topics Concern   Not on file  Social History Narrative   Not on file   Social Determinants of Health   Financial Resource Strain: Not on file  Food Insecurity: No Food Insecurity (10/10/2022)   Hunger Vital  Sign    Worried About Running Out of Food in the Last Year: Never true    Ran Out of Food in the Last Year: Never true  Transportation Needs: No Transportation Needs (10/10/2022)   PRAPARE - Hydrologist (Medical): No    Lack of Transportation (Non-Medical): No  Physical Activity: Not on file  Stress: Not on file  Social Connections: Not on file   Additional Social History:     Sleep: Good  Appetite:  Poor  Current Medications: Current Facility-Administered Medications  Medication Dose Route Frequency Provider Last Rate Last Admin   acetaminophen (TYLENOL) tablet 650 mg  650 mg Oral Q6H PRN Patrecia Pour, NP       alum & mag hydroxide-simeth (MAALOX/MYLANTA) 200-200-20 MG/5ML suspension 30 mL  30 mL Oral Q4H PRN Patrecia Pour, NP       buPROPion (WELLBUTRIN XL) 24 hr tablet 300 mg  300 mg Oral q morning Patrecia Pour, NP   300 mg at 10/12/22 0932   clonazePAM (KLONOPIN) tablet 1 mg  1 mg Oral BID Clapacs, John T, MD   1 mg at 10/12/22 2158   feeding supplement (ENSURE ENLIVE / ENSURE PLUS) liquid 237 mL  237 mL Oral BID BM Clapacs, John T, MD       FLUoxetine (PROZAC) capsule 20 mg  20 mg Oral QODAY Clapacs, Madie Reno, MD   20 mg at 10/11/22 1221   FLUoxetine (PROZAC) capsule 40 mg  40 mg Oral QODAY Clapacs, John T, MD   40 mg at 10/12/22 0932   lisinopril (ZESTRIL) tablet 10 mg  10 mg Oral Daily Patrecia Pour, NP   10 mg at 10/12/22 [id number]   magnesium hydroxide (MILK OF MAGNESIA) suspension 30 mL  30 mL Oral Daily PRN Patrecia Pour, NP       mirtazapine (REMERON) tablet 15 mg  15 mg Oral QHS Patrecia Pour, NP   15 mg at 10/12/22 2158   multivitamin with minerals tablet 1 tablet  1 tablet Oral Daily Clapacs, Madie Reno, MD   1 tablet at 10/12/22 0931   OLANZapine (ZYPREXA) tablet 5 mg  5 mg Oral BID PRN Patrecia Pour, NP   5 mg at 10/11/22 1646   QUEtiapine (SEROQUEL) tablet 50 mg  50 mg Oral QHS Patrecia Pour, NP   50 mg at 10/12/22 2158    Lab  Results:  Results for orders placed or performed during the hospital encounter of 10/10/22 (from the past 48 hour(s))  Lipid panel     Status: Abnormal   Collection Time: 10/12/22  7:19 AM  Result Value Ref Range   Cholesterol 215 (H) 0 - 200 mg/dL   Triglycerides 78 <150 mg/dL   HDL 87 >40 mg/dL   Total CHOL/HDL Ratio 2.5 RATIO   VLDL 16 0 - 40 mg/dL   LDL Cholesterol 112 (H) 0 - 99 mg/dL    Comment:        Total Cholesterol/HDL:CHD Risk Coronary Heart Disease Risk Table                     Men   Women  1/2 Average Risk   3.4   3.3  Average Risk       5.0   4.4  2 X Average Risk   9.6   7.1  3 X Average Risk  23.4   11.0        Use the calculated Patient Ratio above and the CHD Risk Table to determine the patient's CHD Risk.        ATP III CLASSIFICATION (LDL):  <100     mg/dL   Optimal  100-129  mg/dL   Near or Above                    Optimal  130-159  mg/dL   Borderline  160-189  mg/dL   High  >190     mg/dL   Very High Performed at [facility], [address] [place], [place] [address]   Hemoglobin A1c     Status: None   Collection Time: 10/12/22  7:19 AM  Result Value Ref Range   Hgb A1c MFr Bld 4.9 4.8 - 5.6 %    Comment: (NOTE) Pre diabetes:          5.7%-6.4%  Diabetes:              >6.4%  Glycemic control for   <7.0% adults with diabetes    Mean Plasma Glucose 93.93 mg/dL    Comment: Performed at [facility] [address], [place], [place] [address]    Blood Alcohol level:  Lab Results  Component Value Date   [employer] <10 10/09/2022   ETH <10 [id number]    Metabolic Disorder Labs:  Lab Results  Component Value Date   HGBA1C 4.9 10/12/2022   MPG 93.93 10/12/2022   No results found for: "PROLACTIN" Lab Results  Component Value Date   CHOL 215 (H) 10/12/2022   TRIG 78 10/12/2022   HDL 87 10/12/2022   CHOLHDL 2.5 10/12/2022   VLDL 16 10/12/2022   LDLCALC 112 (H) 10/12/2022     Musculoskeletal: Strength & Muscle Tone:  within normal limits Gait & Station: normal Patient leans: N/A  Psychiatric Specialty Exam: Physical Exam Vitals and nursing note reviewed.  Constitutional:      Appearance: Normal appearance.  HENT:     Head: Normocephalic.     Nose: Nose normal.  Pulmonary:     Effort: Pulmonary effort is normal.  Musculoskeletal:        General: Normal range of motion.     Cervical back: Normal range of motion.  Neurological:     General: No focal deficit present.     Mental Status: She is alert and oriented to person, place, and time.  Psychiatric:        Attention and Perception: Attention and perception normal.        Mood and Affect: Mood is anxious and depressed.        Speech: Speech normal.        Behavior: Behavior normal. Behavior is cooperative.        Thought Content: Thought content includes suicidal ideation.        Cognition and Memory: Cognition and memory normal.        Judgment: Judgment normal.     Review of Systems  Psychiatric/Behavioral:  Positive for depression. The patient is nervous/anxious.   All other systems reviewed and are negative.   Blood pressure (!) 145/82, pulse 62, temperature 97.8 F (36.6 C), temperature source Oral, resp. rate 16, height 5' 2"$  (1.575 m), weight 49.4 kg, SpO2 99 %.Body mass index is 19.94 kg/m.  General Appearance: Casual  Eye Contact:  Good  Speech:  Normal Rate  Volume:  Normal  Mood:  Anxious and Depressed  Affect:  Congruent  Thought Process:  Coherent and Descriptions of Associations: Intact  Orientation:  Full (Time, Place, and Person)  Thought Content:  WDL and Logical  Suicidal Thoughts:  No  Homicidal Thoughts:  No  Memory:  Immediate;   Good Recent;   Good Remote;   Good  Judgement:  Fair  Insight:  Fair  Psychomotor Activity:  Normal  Concentration:  Concentration: Good and Attention Span: Good  Recall:  Good  Fund of Knowledge:  Good  Language:  Good  Akathisia:  No  Handed:  Right  AIMS (if indicated):      Assets:  Housing Leisure Time Physical Health Resilience Social Support  ADL's:  Intact  Cognition:  WNL  Sleep:         Physical Exam: Physical Exam Vitals and nursing note reviewed.  Constitutional:      Appearance: Normal appearance.  HENT:     Head: Normocephalic.     Nose: Nose normal.  Pulmonary:     Effort: Pulmonary effort is normal.  Musculoskeletal:        General: Normal range of motion.     Cervical back: Normal range of motion.  Neurological:     General: No focal deficit present.     Mental Status: She is alert and oriented to person, place, and time.  Psychiatric:        Attention and Perception: Attention  and perception normal.        Mood and Affect: Mood is anxious and depressed.        Speech: Speech normal.        Behavior: Behavior normal. Behavior is cooperative.        Thought Content: Thought content includes suicidal ideation.        Cognition and Memory: Cognition and memory normal.        Judgment: Judgment normal.    Review of Systems  Psychiatric/Behavioral:  Positive for depression. The patient is nervous/anxious.   All other systems reviewed and are negative.  Blood pressure (!) 145/82, pulse 62, temperature 97.8 F (36.6 C), temperature source Oral, resp. rate 16, height 5' 2"$  (1.575 m), weight 49.4 kg, SpO2 99 %. Body mass index is 19.94 kg/m.  Treatment Plan Summary: Daily contact with patient to assess and evaluate symptoms and progress in treatment, Medication management, and Plan : Major depressive disorder, recurrent, severe without psychosis: Prozac 60 mg daily Wellbutrin 300 mg daily Considering ECT  Insomnia Seroquel 50 mg daily Remeron 30 mg daily at bedtime reduced to 15 mg as the lower amounts target the histamine receptor better  Anxiety: Klonopin 1 mg BID  Waylan Boga, NP 10/13/2022, 8:54 AM

## 2022-10-13 NOTE — Progress Notes (Signed)
   10/13/22 0749  Psych Admission Type (Psych Patients Only)  Admission Status Voluntary  Psychosocial Assessment  Patient Complaints Anxiety;Depression  Eye Contact Fair  Facial Expression Anxious  Affect Anxious  Speech Logical/coherent  Interaction Assertive  Motor Activity Slow  Appearance/Hygiene Unremarkable  Behavior Characteristics Cooperative  Mood Anxious  Thought Process  Coherency WDL  Content WDL  Delusions None reported or observed  Perception WDL  Hallucination None reported or observed  Judgment Impaired  Confusion None  Danger to Self  Current suicidal ideation? Denies  Danger to Others  Danger to Others None reported or observed

## 2022-10-13 NOTE — BH IP Treatment Plan (Signed)
Interdisciplinary Treatment and Diagnostic Plan Update  10/13/2022 Time of Session: 10:30AM Caitlyn [name] MRN: [id number]  Principal Diagnosis: Major depressive disorder, recurrent severe without psychotic features ([place])  Secondary Diagnoses: Principal Problem:   Major depressive disorder, recurrent severe without psychotic features ([place])   Current Medications:  Current Facility-Administered Medications  Medication Dose Route Frequency Provider Last Rate Last Admin   acetaminophen (TYLENOL) tablet 650 mg  650 mg Oral Q6H PRN Patrecia Pour, NP       alum & mag hydroxide-simeth (MAALOX/MYLANTA) 200-200-20 MG/5ML suspension 30 mL  30 mL Oral Q4H PRN Patrecia Pour, NP       buPROPion (WELLBUTRIN XL) 24 hr tablet 300 mg  300 mg Oral q morning Reita Cliche, Jamison Y, NP   300 mg at 10/12/22 0932   clonazePAM (KLONOPIN) tablet 1 mg  1 mg Oral BID Clapacs, John T, MD   1 mg at 10/12/22 2158   feeding supplement (ENSURE ENLIVE / ENSURE PLUS) liquid 237 mL  237 mL Oral BID BM Clapacs, John T, MD       FLUoxetine (PROZAC) capsule 20 mg  20 mg Oral QODAY Clapacs, John T, MD   20 mg at 10/11/22 1221   FLUoxetine (PROZAC) capsule 40 mg  40 mg Oral QODAY Clapacs, John T, MD   40 mg at 10/12/22 0932   lisinopril (ZESTRIL) tablet 10 mg  10 mg Oral Daily Patrecia Pour, NP   10 mg at 10/12/22 [id number]   magnesium hydroxide (MILK OF MAGNESIA) suspension 30 mL  30 mL Oral Daily PRN Patrecia Pour, NP       mirtazapine (REMERON) tablet 15 mg  15 mg Oral QHS Patrecia Pour, NP   15 mg at 10/12/22 2158   multivitamin with minerals tablet 1 tablet  1 tablet Oral Daily Clapacs, Madie Reno, MD   1 tablet at 10/12/22 0931   OLANZapine (ZYPREXA) tablet 5 mg  5 mg Oral BID PRN Patrecia Pour, NP   5 mg at 10/11/22 1646   QUEtiapine (SEROQUEL) tablet 50 mg  50 mg Oral QHS Patrecia Pour, NP   50 mg at 10/12/22 2158   PTA Medications: Medications Prior to Admission  Medication Sig Dispense Refill Last Dose   buPROPion  (WELLBUTRIN XL) 300 MG 24 hr tablet Take 300 mg by mouth every morning.      cycloSPORINE (RESTASIS) 0.05 % ophthalmic emulsion Place 1 drop into both eyes daily.      FLUoxetine (PROZAC) 20 MG capsule Take 20-40 mg by mouth See admin instructions. Takes 20 mg and 40 mg on alternate days.      lisinopril (ZESTRIL) 10 MG tablet Take 10 mg by mouth daily.      QUEtiapine (SEROQUEL) 50 MG tablet Take 50 mg by mouth at bedtime.       Patient Stressors: Other: just moved here from [facility]    Patient Strengths: Capable of independent living  Communication skills  General fund of knowledge  Supportive family/friends   Treatment Modalities: Medication Management, Group therapy, Case management,  1 to 1 session with clinician, Psychoeducation, Recreational therapy.   Physician Treatment Plan for Primary Diagnosis: Major depressive disorder, recurrent severe without psychotic features ([place]) Long Term Goal(s): Improvement in symptoms so as ready for discharge   Short Term Goals: Compliance with prescribed medications will improve Ability to verbalize feelings will improve Ability to disclose and discuss suicidal ideas Ability to demonstrate self-control will improve Ability to identify and develop  effective coping behaviors will improve  Medication Management: Evaluate patient's response, side effects, and tolerance of medication regimen.  Therapeutic Interventions: 1 to 1 sessions, Unit Group sessions and Medication administration.  Evaluation of Outcomes: Not Met  Physician Treatment Plan for Secondary Diagnosis: Principal Problem:   Major depressive disorder, recurrent severe without psychotic features ([place])  Long Term Goal(s): Improvement in symptoms so as ready for discharge   Short Term Goals: Compliance with prescribed medications will improve Ability to verbalize feelings will improve Ability to disclose and discuss suicidal ideas Ability to demonstrate self-control will  improve Ability to identify and develop effective coping behaviors will improve     Medication Management: Evaluate patient's response, side effects, and tolerance of medication regimen.  Therapeutic Interventions: 1 to 1 sessions, Unit Group sessions and Medication administration.  Evaluation of Outcomes: Not Met   RN Treatment Plan for Primary Diagnosis: Major depressive disorder, recurrent severe without psychotic features ([place]) Long Term Goal(s): Knowledge of disease and therapeutic regimen to maintain health will improve  Short Term Goals: Ability to remain free from injury will improve, Ability to verbalize frustration and anger appropriately will improve, Ability to demonstrate self-control, Ability to participate in decision making will improve, Ability to verbalize feelings will improve, Ability to disclose and discuss suicidal ideas, Ability to identify and develop effective coping behaviors will improve, and Compliance with prescribed medications will improve  Medication Management: RN will administer medications as ordered by provider, will assess and evaluate patient's response and provide education to patient for prescribed medication. RN will report any adverse and/or side effects to prescribing provider.  Therapeutic Interventions: 1 on 1 counseling sessions, Psychoeducation, Medication administration, Evaluate responses to treatment, Monitor vital signs and CBGs as ordered, Perform/monitor CIWA, COWS, AIMS and Fall Risk screenings as ordered, Perform wound care treatments as ordered.  Evaluation of Outcomes: Not Met   LCSW Treatment Plan for Primary Diagnosis: Major depressive disorder, recurrent severe without psychotic features ([place]) Long Term Goal(s): Safe transition to appropriate next level of care at discharge, Engage patient in therapeutic group addressing interpersonal concerns.  Short Term Goals: Engage patient in aftercare planning with referrals and resources,  Increase social support, Increase ability to appropriately verbalize feelings, Increase emotional regulation, Facilitate acceptance of mental health diagnosis and concerns, and Increase skills for wellness and recovery  Therapeutic Interventions: Assess for all discharge needs, 1 to 1 time with Social worker, Explore available resources and support systems, Assess for adequacy in community support network, Educate family and significant other(s) on suicide prevention, Complete Psychosocial Assessment, Interpersonal group therapy.  Evaluation of Outcomes: Not Met   Progress in Treatment: Attending groups: Yes. Participating in groups: Yes. Taking medication as prescribed: Yes. Toleration medication: Yes. Family/Significant other contact made: Yes, individual(s) contacted:  SPE completed with pt's daughter in law, [employer] Patient understands diagnosis: Yes. Discussing patient identified problems/goals with staff: Yes. Medical problems stabilized or resolved: Yes. Denies suicidal/homicidal ideation: No. Issues/concerns per patient self-inventory: No. Other: None  New problem(s) identified: No, Describe:  None  New Short Term/Long Term Goal(s): Patient to work towards medication management for mood stabilization; elimination of SI thoughts; development of comprehensive mental wellness plan.   Patient Goals:  "find motivation"  Discharge Plan or Barriers: CSW will assist pt with development of appropriate discharge/aftercare plan.   Reason for Continuation of Hospitalization: Anxiety Depression Suicidal ideation  Estimated Length of Stay: TBD  Last 3 [place] Suicide Severity Risk Score: Flowsheet Row Admission (Current) from 10/10/2022 in [facility] ED from 10/09/2022 in [facility] Emergency Department at [facility] ED from 01/07/2022 in [facility] Emergency Department at [facility] High Risk High Risk No Risk        Last [facility] 2/9 Scores:     No data to display          Scribe for Treatment Team: Jeremiah Curci A [place], Latanya Presser 10/13/2022 8:22 AM

## 2022-10-13 NOTE — Progress Notes (Signed)
   10/13/22 2030  Psych Admission Type (Psych Patients Only)  Admission Status Voluntary  Psychosocial Assessment  Patient Complaints Anxiety;Depression  Eye Contact Fair  Facial Expression Anxious;Worried  Affect Anxious  Catering manager Activity Slow  Appearance/Hygiene Unremarkable  Behavior Characteristics Cooperative;Appropriate to situation;Anxious  Mood Anxious;Depressed  Thought Process  Coherency WDL  Content WDL  Delusions None reported or observed  Perception WDL  Hallucination None reported or observed  Judgment Impaired  Confusion Mild  Danger to Self  Current suicidal ideation? Denies  Danger to Others  Danger to Others None reported or observed   Progress note   D: Pt seen in dayroom visiting with her son. Pt denies SI, HI, AVH. Pt rates pain  0/10. Pt rates anxiety  7/10 and depression  7/10. Pt and son are both asking questions about ECT. Pt's son wants a call from the doctor in the morning so that he can ask questions about ECT. Pt looks nervous and anxious when discussing the subject. "I'm scared." Pt questions answered. Pt stated she needed some hygiene supplies and they were provided. Pt son concerned because pt has failed several medication trials in the past. No other concerns noted at this time.  A: Pt provided support and encouragement. Pt given scheduled medication as prescribed. PRNs as appropriate. Q15 min checks for safety.   R: Pt safe on the unit. Will continue to monitor.

## 2022-10-13 NOTE — Progress Notes (Signed)
   10/12/22 2200  Psych Admission Type (Psych Patients Only)  Admission Status Voluntary  Psychosocial Assessment  Patient Complaints Depression;Anxiety  Eye Contact Fair  Facial Expression Anxious  Affect Anxious  Speech Logical/coherent  Interaction Assertive  Motor Activity Other (Comment) (WNL)  Appearance/Hygiene Unremarkable  Behavior Characteristics Cooperative  Mood Anxious  Thought Process  Coherency WDL  Content WDL  Delusions None reported or observed  Perception WDL  Hallucination None reported or observed  Judgment Impaired  Confusion None  Danger to Self  Current suicidal ideation? Denies  Self-Injurious Behavior No self-injurious ideation or behavior indicators observed or expressed   Agreement Not to Harm Self Yes  Description of Agreement Verbal  Danger to Others  Danger to Others None reported or observed

## 2022-10-13 NOTE — Progress Notes (Signed)
Patient ID: Caitlyn [name], female   DOB: [birth year], 77 y.o.   MRN: [id number] Follow-up psychiatric and ECT note: Patient seen today.  This 77 year old woman was admitted to the hospital because of worsening depression with some suicidal statements.  I had recommended ECT to her when she first came into the hospital.  I am coming back to discuss it with her again.  Once again recommended to the patient that based on her diagnosis her history of nonresponse to multiple medications the dangerousness of her behavior and lack of contraindication she is a good candidate for ECT.  Recommended ECT treatment.  Patient continues to be very anxious and somewhat confused.  Perseverates on ideas like what she will do after she is in the hospital without being able to process the idea of her depression getting better.  Patient was not able to make a decision about ECT stated she will talk with her family.  We will follow-up tomorrow again.  For now I am increasing her dose of mirtazapine to 30 mg for antidepressant effect.  Blood pressure slightly elevated but we will follow up with that and not add medicine yet.

## 2022-10-13 NOTE — Group Note (Signed)
[facility] LCSW Group Therapy Note   Group Date: 10/13/2022 Start Time: [id number] End Time: 1605   Type of Therapy/Topic:  Group Therapy:  Balance in Life  Participation Level:  Active   Description of Group:    This group will address the concept of balance and how it feels and looks when one is unbalanced. Patients will be encouraged to process areas in their lives that are out of balance, and identify reasons for remaining unbalanced. Facilitators will guide patients utilizing problem- solving interventions to address and correct the stressor making their life unbalanced. Understanding and applying boundaries will be explored and addressed for obtaining  and maintaining a balanced life. Patients will be encouraged to explore ways to assertively make their unbalanced needs known to significant others in their lives, using other group members and facilitator for support and feedback.  Therapeutic Goals: Patient will identify two or more emotions or situations they have that consume much of in their lives. Patient will identify signs/triggers that life has become out of balance:  Patient will identify two ways to set boundaries in order to achieve balance in their lives:  Patient will demonstrate ability to communicate their needs through discussion and/or role plays  Summary of Patient Progress:    Patient was present for the entirety of the group session. Patient was an active listener and participated in the topic of discussion and added nuance to topic of conversation.  CSW provided gratitude activity assignment to assist patient in development of identifying goals for a more balanced life. She said that her goal was to bet back to her family "because they are the most important thing in my life." She said that she is sad right not and is considering whether ECT will improve her depression. She stated she missed her friends and still has not found an activity that can occupy her time and make her  feel enthused about life.She stated she missed her old book club group and contemplated starting one of her own.     Therapeutic Modalities:   Cognitive Behavioral Therapy Solution-Focused Therapy Assertiveness Training   [place] A [place], LCSWA

## 2022-10-14 DIAGNOSIS — F332 Major depressive disorder, recurrent severe without psychotic features: Secondary | ICD-10-CM | POA: Diagnosis not present

## 2022-10-14 MED ORDER — CLONAZEPAM 0.5 MG PO TABS
0.5000 mg | ORAL_TABLET | Freq: Two times a day (BID) | ORAL | Status: DC
  Administered 2022-10-14 – 2022-10-18 (×7): 0.5 mg via ORAL
  Filled 2022-10-14 (×7): qty 1

## 2022-10-14 NOTE — Group Note (Signed)
LCSW Group Therapy Note  Group Date: 10/14/2022 Start Time: 1300 End Time: 1350   Type of Therapy and Topic:  Group Therapy - Healthy vs Unhealthy Coping Skills  Participation Level:  Active   Description of Group The focus of this group was to determine what unhealthy coping techniques typically are used by group members and what healthy coping techniques would be helpful in coping with various problems. Patients were guided in becoming aware of the differences between healthy and unhealthy coping techniques. Patients were asked to identify 2-3 healthy coping skills they would like to learn to use more effectively.  Therapeutic Goals Patients learned that coping is what human beings do all day long to deal with various situations in their lives Patients defined and discussed healthy vs unhealthy coping techniques Patients identified their preferred coping techniques and identified whether these were healthy or unhealthy Patients determined 2-3 healthy coping skills they would like to become more familiar with and use more often. Patients provided support and ideas to each other   Summary of Patient Progress:  During group, she expressed that her husband was a source of great support. She stated that doing chair yoga was a calming experience for her and doing community volunteer activities was also a positive skill. She did not express in unhealthy coping strategies. Patient proved open to input from peers and feedback from [place]. Patient demonstrated fair insight into the subject matter, was respectful of peers, and participated throughout the entire session.   Therapeutic Modalities Cognitive Behavioral Therapy Motivational Interviewing  Jeannifer Drakeford A [place], [place] 10/14/2022  2:10 PM

## 2022-10-14 NOTE — Progress Notes (Signed)
Patient's son, Olufunke Blickenstaff [phone]) wants to speak with the provider about ECT any time after 8 am on 10/14/22.

## 2022-10-14 NOTE — Plan of Care (Signed)
Pt endorses anxiety/depression at this time. Pt reports anxiety and depression as being unchanged and states she wants to go home and does not want to go to group today. Pt was educated on the importance of attending group therapy session. Pt understands importance however, still does not want to attend. Pt denies SI/HI/AVH or pain at this time. Pt is calm and cooperative. Pt is medication compliant. Pt provided with support and encouragement. Pt monitored q15 minutes for safety per unit policy. Plan of care ongoing.   Pt has c/o constipation. Medication intervention was offered and refused by pt. Fluids, movement, and nutritional consumption are being encouraged.   Pt voices concerns and anxiety with ECT. Pt requested to speak with MD about procedure. MD notified and came to speak with pt in room. Message that son wanted to speak with MD about ECT relayed along with contact number.   Problem: Education: Goal: Knowledge of General Education information will improve Description: Including pain rating scale, medication(s)/side effects and non-pharmacologic comfort measures Outcome: Progressing   Problem: Coping: Goal: Level of anxiety will decrease Outcome: Not Progressing

## 2022-10-14 NOTE — Progress Notes (Signed)
[facility] MD Progress Note  10/14/2022 3:33 PM Caitlyn [name]  MRN:  [id number]  Subjective:  77 yo female admitted for severe depression and an increase in suicidal ideations. States that she is presently "feeling better" and primary concern is meeting goal of ECT treatment, which she addressed earlier this afternoon with MD Clapacs.She reports sleep is adequate and that appetite is "doing better". Denies currently feeling depression/anxiety symptoms. Denies current feelings of SI/HI/AH/VH.  Principal Problem: Major depressive disorder, recurrent severe without psychotic features ([place]) Diagnosis: Principal Problem:   Major depressive disorder, recurrent severe without psychotic features ([place])  Total Time spent with patient: 30 minutes  Past Psychiatric History: depression, anxiety  Past Medical History:  Past Medical History:  Diagnosis Date   Anxiety    Depression    Hypertension     Past Surgical History:  Procedure Laterality Date   EYE SURGERY     Family History:  Family History  Problem Relation Age of Onset   Breast cancer Neg Hx    Family Psychiatric  History: none Social History:  Social History   Substance and Sexual Activity  Alcohol Use Never     Social History   Substance and Sexual Activity  Drug Use Never    Social History   Socioeconomic History   Marital status: Married    Spouse name: Not on file   Number of children: Not on file   Years of education: Not on file   Highest education level: Not on file  Occupational History   Not on file  Tobacco Use   Smoking status: Never   Smokeless tobacco: Never  Vaping Use   Vaping Use: Never used  Substance and Sexual Activity   Alcohol use: Never   Drug use: Never   Sexual activity: Not on file  Other Topics Concern   Not on file  Social History Narrative   Not on file   Social Determinants of Health   Financial Resource Strain: Not on file  Food Insecurity: No Food Insecurity (10/10/2022)   Hunger  Vital Sign    Worried About Running Out of Food in the Last Year: Never true    Ran Out of Food in the Last Year: Never true  Transportation Needs: No Transportation Needs (10/10/2022)   PRAPARE - Hydrologist (Medical): No    Lack of Transportation (Non-Medical): No  Physical Activity: Not on file  Stress: Not on file  Social Connections: Not on file   Additional Social History:     Sleep: Good  Appetite:  Poor  Current Medications: Current Facility-Administered Medications  Medication Dose Route Frequency Provider Last Rate Last Admin   acetaminophen (TYLENOL) tablet 650 mg  650 mg Oral Q6H PRN Patrecia Pour, NP       alum & mag hydroxide-simeth (MAALOX/MYLANTA) 200-200-20 MG/5ML suspension 30 mL  30 mL Oral Q4H PRN Patrecia Pour, NP       buPROPion (WELLBUTRIN XL) 24 hr tablet 300 mg  300 mg Oral q morning Patrecia Pour, NP   300 mg at 10/14/22 0941   clonazePAM (KLONOPIN) tablet 1 mg  1 mg Oral BID Clapacs, John T, MD   1 mg at 10/14/22 0941   feeding supplement (ENSURE ENLIVE / ENSURE PLUS) liquid 237 mL  237 mL Oral BID BM Clapacs, John T, MD       FLUoxetine (PROZAC) capsule 20 mg  20 mg Oral QODAY Clapacs, Madie [place], MD  20 mg at 10/13/22 [id number]   FLUoxetine (PROZAC) capsule 40 mg  40 mg Oral QODAY Clapacs, John T, MD   40 mg at 10/14/22 0941   lisinopril (ZESTRIL) tablet 10 mg  10 mg Oral Daily Patrecia Pour, NP   10 mg at 10/14/22 0940   magnesium hydroxide (MILK OF MAGNESIA) suspension 30 mL  30 mL Oral Daily PRN Patrecia Pour, NP       mirtazapine (REMERON) tablet 30 mg  30 mg Oral QHS Clapacs, Madie Reno, MD   30 mg at 10/13/22 2123   multivitamin with minerals tablet 1 tablet  1 tablet Oral Daily Clapacs, Madie [place], MD   1 tablet at 10/14/22 0940   OLANZapine (ZYPREXA) tablet 5 mg  5 mg Oral BID PRN Patrecia Pour, NP   5 mg at 10/11/22 1646   QUEtiapine (SEROQUEL) tablet 50 mg  50 mg Oral QHS Patrecia Pour, NP   50 mg at 10/13/22 2123     Lab Results:  No results found for this or any previous visit (from the past 48 hour(s)).   Blood Alcohol level:  Lab Results  Component Value Date   ETH <10 10/09/2022   ETH <10 [id number]    Metabolic Disorder Labs: Lab Results  Component Value Date   HGBA1C 4.9 10/12/2022   MPG 93.93 10/12/2022   No results found for: "PROLACTIN" Lab Results  Component Value Date   CHOL 215 (H) 10/12/2022   TRIG 78 10/12/2022   HDL 87 10/12/2022   CHOLHDL 2.5 10/12/2022   VLDL 16 10/12/2022   LDLCALC 112 (H) 10/12/2022     Musculoskeletal: Strength & Muscle Tone: within normal limits Gait & Station: normal Patient leans: N/A  Psychiatric Specialty Exam: Physical Exam Vitals and nursing note reviewed.  Constitutional:      Appearance: Normal appearance.  HENT:     Head: Normocephalic.     Nose: Nose normal.  Pulmonary:     Effort: Pulmonary effort is normal.  Musculoskeletal:        General: Normal range of motion.     Cervical back: Normal range of motion.  Neurological:     General: No focal deficit present.     Mental Status: She is alert and oriented to person, place, and time.  Psychiatric:        Attention and Perception: Attention and perception normal.        Mood and Affect: Mood is anxious and depressed.        Speech: Speech normal.        Behavior: Behavior normal. Behavior is cooperative.        Thought Content: Thought content includes suicidal ideation.        Cognition and Memory: Cognition and memory normal.        Judgment: Judgment normal.     Review of Systems  Psychiatric/Behavioral:  Positive for depression. The patient is nervous/anxious.   All other systems reviewed and are negative.   Blood pressure (!) 159/86, pulse 60, temperature (!) 97.5 F (36.4 C), temperature source Oral, resp. rate 18, height '5\' 2"'$  (1.575 m), weight 49.4 kg, SpO2 98 %.Body mass index is 19.94 kg/m.  General Appearance: Casual  Eye Contact:  Good  Speech:   Normal Rate  Volume:  Normal  Mood:  Anxious and Depressed  Affect:  Congruent  Thought Process:  Coherent and Descriptions of Associations: Intact  Orientation:  Full (Time, Place, and Person)  Thought Content:  WDL and Logical  Suicidal Thoughts:  No  Homicidal Thoughts:  No  Memory:  Immediate;   Good Recent;   Good Remote;   Good  Judgement:  Fair  Insight:  Fair  Psychomotor Activity:  Normal  Concentration:  Concentration: Good and Attention Span: Good  Recall:  Good  Fund of Knowledge:  Good  Language:  Good  Akathisia:  No  Handed:  Right  AIMS (if indicated):     Assets:  Housing Leisure Time Physical Health Resilience Social Support  ADL's:  Intact  Cognition:  WNL  Sleep:         Physical Exam: Physical Exam Vitals and nursing note reviewed.  Constitutional:      Appearance: Normal appearance.  HENT:     Head: Normocephalic.     Nose: Nose normal.  Pulmonary:     Effort: Pulmonary effort is normal.  Musculoskeletal:        General: Normal range of motion.     Cervical back: Normal range of motion.  Neurological:     General: No focal deficit present.     Mental Status: She is alert and oriented to person, place, and time.  Psychiatric:        Attention and Perception: Attention and perception normal.        Mood and Affect: Mood is anxious and depressed.        Speech: Speech normal.        Behavior: Behavior normal. Behavior is cooperative.        Thought Content: Thought content includes suicidal ideation.        Cognition and Memory: Cognition and memory normal.        Judgment: Judgment normal.    Review of Systems  Psychiatric/Behavioral:  Positive for depression. The patient is nervous/anxious.   All other systems reviewed and are negative.  Blood pressure (!) 159/86, pulse 60, temperature (!) 97.5 F (36.4 C), temperature source Oral, resp. rate 18, height '5\' 2"'$  (1.575 m), weight 49.4 kg, SpO2 98 %. Body mass index is 19.94  kg/m.  Treatment Plan Summary: Daily contact with patient to assess and evaluate symptoms and progress in treatment, Medication management, and Plan : Major depressive disorder, recurrent, severe without psychosis: Prozac 60 mg daily Wellbutrin 300 mg daily Considering ECT  Insomnia Seroquel 50 mg daily Remeron 30 mg daily at bedtime reduced to 15 mg as the lower amounts target the histamine receptor better  Anxiety: Klonopin 1 mg BID  Waylan [place], NP 10/14/2022, 3:33 PM

## 2022-10-14 NOTE — Progress Notes (Signed)
[facility] MD Progress Note  10/14/2022 4:51 PM Midgie Acampora  MRN:  [id number] Subjective: Follow-up with this 77 year old woman with severe major depression.  Agree with nurse practitioner note.  Patient seen to follow-up and to discuss ECT.  Patient continues to be very depressed.  Stays withdrawn to her room.  Very nervous affect slow thinking tends to perseverate on anxious issues.  Has not been attempting self-harm or aggressive on the unit and is eating okay. Principal Problem: Major depressive disorder, recurrent severe without psychotic features ([place]) Diagnosis: Principal Problem:   Major depressive disorder, recurrent severe without psychotic features ([place])  Total Time spent with patient: 30 minutes  Past Psychiatric History: Patient has a history of recurrent depression.  Recently has developed more aggressive thoughts about suicide  Past Medical History:  Past Medical History:  Diagnosis Date   Anxiety    Depression    Hypertension     Past Surgical History:  Procedure Laterality Date   EYE SURGERY     Family History:  Family History  Problem Relation Age of Onset   Breast cancer Neg Hx    Family Psychiatric  History: See for previous Social History:  Social History   Substance and Sexual Activity  Alcohol Use Never     Social History   Substance and Sexual Activity  Drug Use Never    Social History   Socioeconomic History   Marital status: Married    Spouse name: Not on file   Number of children: Not on file   Years of education: Not on file   Highest education level: Not on file  Occupational History   Not on file  Tobacco Use   Smoking status: Never   Smokeless tobacco: Never  Vaping Use   Vaping Use: Never used  Substance and Sexual Activity   Alcohol use: Never   Drug use: Never   Sexual activity: Not on file  Other Topics Concern   Not on file  Social History Narrative   Not on file   Social Determinants of Health   Financial Resource Strain:  Not on file  Food Insecurity: No Food Insecurity (10/10/2022)   Hunger Vital Sign    Worried About Running Out of Food in the Last Year: Never true    Ran Out of Food in the Last Year: Never true  Transportation Needs: No Transportation Needs (10/10/2022)   PRAPARE - Hydrologist (Medical): No    Lack of Transportation (Non-Medical): No  Physical Activity: Not on file  Stress: Not on file  Social Connections: Not on file   Additional Social History:                         Sleep: Fair  Appetite:  Fair  Current Medications: Current Facility-Administered Medications  Medication Dose Route Frequency Provider Last Rate Last Admin   acetaminophen (TYLENOL) tablet 650 mg  650 mg Oral Q6H PRN Patrecia Pour, NP       alum & mag hydroxide-simeth (MAALOX/MYLANTA) 200-200-20 MG/5ML suspension 30 mL  30 mL Oral Q4H PRN Patrecia Pour, NP       buPROPion (WELLBUTRIN XL) 24 hr tablet 300 mg  300 mg Oral q morning Patrecia Pour, NP   300 mg at 10/14/22 0941   clonazePAM (KLONOPIN) tablet 0.5 mg  0.5 mg Oral BID Adreanna Fickel, Madie Reno, MD       feeding supplement (ENSURE ENLIVE / ENSURE  PLUS) liquid 237 mL  237 mL Oral BID BM Allenmichael Mcpartlin T, MD       FLUoxetine (PROZAC) capsule 20 mg  20 mg Oral QODAY Shelena Castelluccio T, MD   20 mg at 10/13/22 [id number]   FLUoxetine (PROZAC) capsule 40 mg  40 mg Oral QODAY Ariell Gunnels T, MD   40 mg at 10/14/22 0941   lisinopril (ZESTRIL) tablet 10 mg  10 mg Oral Daily Patrecia Pour, NP   10 mg at 10/14/22 0940   magnesium hydroxide (MILK OF MAGNESIA) suspension 30 mL  30 mL Oral Daily PRN Patrecia Pour, NP       mirtazapine (REMERON) tablet 30 mg  30 mg Oral QHS Michiko Lineman, Madie Reno, MD   30 mg at 10/13/22 2123   multivitamin with minerals tablet 1 tablet  1 tablet Oral Daily Alta Shober, Madie Reno, MD   1 tablet at 10/14/22 0940   OLANZapine (ZYPREXA) tablet 5 mg  5 mg Oral BID PRN Patrecia Pour, NP   5 mg at 10/11/22 1646   QUEtiapine  (SEROQUEL) tablet 50 mg  50 mg Oral QHS Patrecia Pour, NP   50 mg at 10/13/22 2123    Lab Results: No results found for this or any previous visit (from the past 48 hour(s)).  Blood Alcohol level:  Lab Results  Component Value Date   ETH <10 10/09/2022   ETH <10 [id number]    Metabolic Disorder Labs: Lab Results  Component Value Date   HGBA1C 4.9 10/12/2022   MPG 93.93 10/12/2022   No results found for: "PROLACTIN" Lab Results  Component Value Date   CHOL 215 (H) 10/12/2022   TRIG 78 10/12/2022   HDL 87 10/12/2022   CHOLHDL 2.5 10/12/2022   VLDL 16 10/12/2022   LDLCALC 112 (H) 10/12/2022    Physical Findings: AIMS:  , ,  ,  ,    CIWA:    COWS:     Musculoskeletal: Strength & Muscle Tone: within normal limits Gait & Station: normal Patient leans: N/A  Psychiatric Specialty Exam:  Presentation  General Appearance:  Casual; Well Groomed  Eye Contact: Good  Speech: Clear and Coherent; Normal Rate  Speech Volume: Normal  Handedness: Right   Mood and Affect  Mood: Anxious; Depressed; Hopeless  Affect: Congruent; Depressed   Thought Process  Thought Processes: Coherent; Goal Directed; Linear  Descriptions of Associations:Intact  Orientation:Full (Time, Place and Person)  Thought Content:Logical  History of Schizophrenia/Schizoaffective disorder:No data recorded Duration of Psychotic Symptoms:No data recorded Hallucinations:No data recorded Ideas of Reference:None  Suicidal Thoughts:No data recorded Homicidal Thoughts:No data recorded  Sensorium  Memory: Immediate Good; Recent Good; Remote Good  Judgment: Impaired  Insight: Fair   Community education officer  Concentration: Good  Attention Span: Good  Recall: Good  Fund of Knowledge: Good  Language: Good   Psychomotor Activity  Psychomotor Activity:No data recorded  Assets  Assets: Communication Skills; Desire for Improvement; Financial Resources/Insurance;  Housing; Intimacy; Physical Health   Sleep  Sleep:No data recorded   Physical Exam: Physical Exam Vitals and nursing note reviewed.  Constitutional:      Appearance: Normal appearance.  HENT:     Head: Normocephalic and atraumatic.     Mouth/Throat:     Pharynx: Oropharynx is clear.  Eyes:     Pupils: Pupils are equal, round, and reactive to light.  Cardiovascular:     Rate and Rhythm: Normal rate and regular rhythm.  Pulmonary:     Effort:  Pulmonary effort is normal.     Breath sounds: Normal breath sounds.  Abdominal:     General: Abdomen is flat.     Palpations: Abdomen is soft.  Musculoskeletal:        General: Normal range of motion.  Skin:    General: Skin is warm and dry.  Neurological:     General: No focal deficit present.     Mental Status: She is alert. Mental status is at baseline.  Psychiatric:        Attention and Perception: She is inattentive.        Mood and Affect: Mood is anxious and depressed.        Speech: Speech is tangential.        Behavior: Behavior is withdrawn.        Thought Content: Thought content normal.        Cognition and Memory: Cognition is impaired. Memory is impaired.    Review of Systems  Constitutional: Negative.   HENT: Negative.    Eyes: Negative.   Respiratory: Negative.    Cardiovascular: Negative.   Gastrointestinal: Negative.   Musculoskeletal: Negative.   Skin: Negative.   Neurological: Negative.   Psychiatric/Behavioral:  Positive for depression. The patient is nervous/anxious and has insomnia.    Blood pressure (!) 159/86, pulse 60, temperature (!) 97.5 F (36.4 C), temperature source Oral, resp. rate 18, height '5\' 2"'$  (1.575 m), weight 49.4 kg, SpO2 98 %. Body mass index is 19.94 kg/m.   Treatment Plan Summary: Daily contact with patient to assess and evaluate symptoms and progress in treatment, Medication management, and Plan patient with severe major depression who has not responded to trials of  medication.  Has had past response to transcranial magnetic stimulation but at this point has been actively expressing suicidal ideation to the point where with good reason the family is not comfortable having her outside the hospital.  I have recommended electroconvulsive therapy to the patient and to her family.  Spoke not only with her but with her son Leroy Sea and her husband Linna Hoff this afternoon.  I think at this point everyone is in agreement that ECT is appropriate.  They will speak with her over the weekend and reinforce this.  I am going to go ahead and order her to be n.p.o. after midnight Sunday.  I have reviewed her labs I do not see anything new that needs to be checked right now.  I have cut the Klonopin dose down in half to facilitate the ECT.  Alethia Berthold, MD 10/14/2022, 4:51 PM

## 2022-10-14 NOTE — Group Note (Signed)
Recreation Therapy Group Note   Group Topic:Leisure Education  Group Date: 10/14/2022 Start Time: 1400 End Time: 1445 Facilitators: Vilma Prader, LRT, CTRS Location:  Dayroom  Group Description: Seated Exercise. Patients were given the choice to do exercise or listen to their favorite music songs. Pts chose to complete seated exercise. LRT and patients discussed the importance of having leisure interests post discharge, and how exercise can be one of them.   Affect/Mood: Appropriate, Flat, and Sad   Participation Level: Engaged   Participation Quality: Independent   Behavior: Appropriate   Speech/Thought Process: Coherent   Insight: Good   Judgement: Good   Modes of Intervention: Activity and Education   Patient Response to Interventions:  Engaged, Interested , and Receptive   Education Outcome:  Acknowledges education   Clinical Observations/Individualized Feedback: Gabrielly was active in their participation of session activities and group discussion. Pt required a lot of encouragement from LRT to come out of her room and to attend group. Pt identified that she wanted to do something "active" for group today. Pt completed all exercises and did not want any water after. Pt shared that she was going back to her room to read once group was done.  Plan: Continue to engage patient in RT group sessions 2-3x/week.   Vilma Prader, LRT, [place] 10/14/2022 2:58 PM

## 2022-10-15 DIAGNOSIS — F332 Major depressive disorder, recurrent severe without psychotic features: Secondary | ICD-10-CM | POA: Diagnosis not present

## 2022-10-15 NOTE — Group Note (Signed)
Date:  10/15/2022 Time:  6:03 PM  Group Topic/Focus:  Overcoming Stress:   The focus of this group is to define stress and help patients assess their triggers. Self Care:   The focus of this group is to help patients understand the importance of self-care in order to improve or restore emotional, physical, spiritual, interpersonal, and financial health.    Participation Level:  Active  Participation Quality:  Appropriate and Attentive  Affect:  Appropriate  Cognitive:  Alert, Appropriate, and Oriented  Insight: Appropriate and Good  Engagement in Group:  Engaged  Modes of Intervention:  Discussion  Additional Comments:    Ladona Mow 10/15/2022, 6:03 PM

## 2022-10-15 NOTE — Progress Notes (Signed)
[facility] MD Progress Note  10/15/2022 1:12 PM Pearle Gabel  MRN:  [id number] Subjective: Caitlyn [name] is seen on rounds.  She has been compliant with medications.  She has no complaints.  She states that she is eating well and sleeping well. Principal Problem: Major depressive disorder, recurrent severe without psychotic features ([place]) Diagnosis: Principal Problem:   Major depressive disorder, recurrent severe without psychotic features ([place])  Total Time spent with patient: 15 minutes  Past Psychiatric History: Depression  Past Medical History:  Past Medical History:  Diagnosis Date   Anxiety    Depression    Hypertension     Past Surgical History:  Procedure Laterality Date   EYE SURGERY     Family History:  Family History  Problem Relation Age of Onset   Breast cancer Neg Hx    Family Psychiatric  History: Unremarkable Social History:  Social History   Substance and Sexual Activity  Alcohol Use Never     Social History   Substance and Sexual Activity  Drug Use Never    Social History   Socioeconomic History   Marital status: Married    Spouse name: Not on file   Number of children: Not on file   Years of education: Not on file   Highest education level: Not on file  Occupational History   Not on file  Tobacco Use   Smoking status: Never   Smokeless tobacco: Never  Vaping Use   Vaping Use: Never used  Substance and Sexual Activity   Alcohol use: Never   Drug use: Never   Sexual activity: Not on file  Other Topics Concern   Not on file  Social History Narrative   Not on file   Social Determinants of Health   Financial Resource Strain: Not on file  Food Insecurity: No Food Insecurity (10/10/2022)   Hunger Vital Sign    Worried About Running Out of Food in the Last Year: Never true    Ran Out of Food in the Last Year: Never true  Transportation Needs: No Transportation Needs (10/10/2022)   PRAPARE - Hydrologist (Medical): No     Lack of Transportation (Non-Medical): No  Physical Activity: Not on file  Stress: Not on file  Social Connections: Not on file   Additional Social History:                         Sleep: Good  Appetite:  Good  Current Medications: Current Facility-Administered Medications  Medication Dose Route Frequency Provider Last Rate Last Admin   acetaminophen (TYLENOL) tablet 650 mg  650 mg Oral Q6H PRN Patrecia Pour, NP       alum & mag hydroxide-simeth (MAALOX/MYLANTA) 200-200-20 MG/5ML suspension 30 mL  30 mL Oral Q4H PRN Patrecia Pour, NP       buPROPion (WELLBUTRIN XL) 24 hr tablet 300 mg  300 mg Oral q morning Patrecia Pour, NP   300 mg at 10/15/22 0941   clonazePAM (KLONOPIN) tablet 0.5 mg  0.5 mg Oral BID Clapacs, John T, MD   0.5 mg at 10/15/22 0941   FLUoxetine (PROZAC) capsule 20 mg  20 mg Oral QODAY Clapacs, John T, MD   20 mg at 10/15/22 0942   FLUoxetine (PROZAC) capsule 40 mg  40 mg Oral QODAY Clapacs, John T, MD   40 mg at 10/14/22 0941   lisinopril (ZESTRIL) tablet 10 mg  10 mg Oral Daily  Patrecia Pour, NP   10 mg at 10/15/22 0941   magnesium hydroxide (MILK OF MAGNESIA) suspension 30 mL  30 mL Oral Daily PRN Patrecia Pour, NP       mirtazapine (REMERON) tablet 30 mg  30 mg Oral QHS Clapacs, Madie Reno, MD   30 mg at 10/14/22 2127   multivitamin with minerals tablet 1 tablet  1 tablet Oral Daily Clapacs, Madie Reno, MD   1 tablet at 10/15/22 0940   OLANZapine (ZYPREXA) tablet 5 mg  5 mg Oral BID PRN Patrecia Pour, NP   5 mg at 10/14/22 1707   QUEtiapine (SEROQUEL) tablet 50 mg  50 mg Oral QHS Patrecia Pour, NP   50 mg at 10/14/22 2127    Lab Results: No results found for this or any previous visit (from the past 48 hour(s)).  Blood Alcohol level:  Lab Results  Component Value Date   ETH <10 10/09/2022   ETH <10 [id number]    Metabolic Disorder Labs: Lab Results  Component Value Date   HGBA1C 4.9 10/12/2022   MPG 93.93 10/12/2022   No results found  for: "PROLACTIN" Lab Results  Component Value Date   CHOL 215 (H) 10/12/2022   TRIG 78 10/12/2022   HDL 87 10/12/2022   CHOLHDL 2.5 10/12/2022   VLDL 16 10/12/2022   LDLCALC 112 (H) 10/12/2022    Physical Findings: AIMS:  , ,  ,  ,    CIWA:    COWS:     Musculoskeletal: Strength & Muscle Tone: within normal limits Gait & Station: normal Patient leans: N/A  Psychiatric Specialty Exam:  Presentation  General Appearance:  Casual; Well Groomed  Eye Contact: Good  Speech: Clear and Coherent; Normal Rate  Speech Volume: Normal  Handedness: Right   Mood and Affect  Mood: Anxious; Depressed; Hopeless  Affect: Congruent; Depressed   Thought Process  Thought Processes: Coherent; Goal Directed; Linear  Descriptions of Associations:Intact  Orientation:Full (Time, Place and Person)  Thought Content:Logical  History of Schizophrenia/Schizoaffective disorder:No data recorded Duration of Psychotic Symptoms:No data recorded Hallucinations:No data recorded Ideas of Reference:None  Suicidal Thoughts:No data recorded Homicidal Thoughts:No data recorded  Sensorium  Memory: Immediate Good; Recent Good; Remote Good  Judgment: Impaired  Insight: Fair   Community education officer  Concentration: Good  Attention Span: Good  Recall: Good  Fund of Knowledge: Good  Language: Good   Psychomotor Activity  Psychomotor Activity:No data recorded  Assets  Assets: Communication Skills; Desire for Improvement; Financial Resources/Insurance; Housing; Intimacy; Physical Health   Sleep  Sleep:No data recorded    Blood pressure 138/83, pulse 62, temperature 98 F (36.7 C), temperature source Oral, resp. rate 18, height '5\' 2"'$  (1.575 m), weight 49.4 kg, SpO2 97 %. Body mass index is 19.94 kg/m.   Treatment Plan Summary: Daily contact with patient to assess and evaluate symptoms and progress in treatment, Medication management, and Plan continue current  medications.  [place], DO 10/15/2022, 1:12 PM

## 2022-10-15 NOTE — Plan of Care (Signed)
  Problem: Nutrition: Goal: Adequate nutrition will be maintained Outcome: Progressing   Problem: Coping: Goal: Level of anxiety will decrease Outcome: Not Progressing   Problem: Elimination: Goal: Will not experience complications related to bowel motility Outcome: Not Progressing

## 2022-10-15 NOTE — Group Note (Signed)
LCSW Group Therapy Note   Group Date: 10/15/2022 Start Time: 1330 End Time: 1430   Type of Therapy and Topic:  Group Therapy: Challenging Core Beliefs  Participation Level:  Active  Description of Group:  Patients were educated about core beliefs and asked to identify one harmful core belief that they have. Patients were asked to explore from where those beliefs originate. Patients were asked to discuss how those beliefs make them feel and the resulting behaviors of those beliefs. They were then be asked if those beliefs are true and, if so, what evidence they have to support them. Lastly, group members were challenged to replace those negative core beliefs with helpful beliefs.   Therapeutic Goals:   1. Patient will identify harmful core beliefs and explore the origins of such beliefs. 2. Patient will identify feelings and behaviors that result from those core beliefs. 3. Patient will discuss whether such beliefs are true. 4.  Patient will replace harmful core beliefs with helpful ones.  Summary of Patient Progress:  Caitlyn [name] actively engaged in processing and exploring how core beliefs are formed and how they impact thoughts, feelings, and behaviors. Patient proved open to input from peers and feedback from [place]. Patient demonstrated developing insight into the subject matter, was respectful and supportive of peers, and participated throughout the entire session.  Therapeutic Modalities: Cognitive Behavioral Therapy; Solution-Focused Therapy   Vassie Moselle, LCSW 10/15/2022  2:31 PM

## 2022-10-15 NOTE — BH Assessment (Signed)
1910 Received patient alert and oriented x 4. She is awaiting a visit from her son.  1920 Patient son arrived for a visit. Noted the interaction was very intense and patient appeared not to be enjoying the conversation.   46 Son stayed approximately 30 minutes and patient returned to her room and went to bed.  2200 Patient has been resting quietly in bed since returning to her room. She is medication compliant. Will continue to monitor patient for safety.   0300 Patient awaken for a short period and wanted to know if it was time to get up. Patient oriented to time of day. Will continue  to monitor patient for safety.

## 2022-10-15 NOTE — Progress Notes (Signed)
Patient is A+O x4. She endorses depression and anxiety both at a 7. She denies SI/HI/AVH. Pain 0/10. Medications are taken whole without any issues. Appetite is good as patient ate 75% of all meals. Patient was less isolative in her room today. She attended group therapy held in the morning and the one in the afternoon. When in her room she enjoys reading. She received multiple calls from her family.  Patient is scheduled to be NPO from Monday, 02.26.24 midnight until ECT procedure.  Q15 minute unit checks in place.

## 2022-10-15 NOTE — Progress Notes (Signed)
   10/14/22 2100  Psych Admission Type (Psych Patients Only)  Admission Status Voluntary  Psychosocial Assessment  Patient Complaints Anxiety;Depression  Eye Contact Fair  Facial Expression Anxious;Sad  Affect Anxious;Depressed  Speech Logical/coherent  Interaction Minimal  Motor Activity Unsteady  Appearance/Hygiene Unremarkable  Behavior Characteristics Cooperative  Mood Depressed;Anxious  Thought Process  Coherency WDL  Content WDL  Delusions None reported or observed  Perception WDL  Hallucination None reported or observed  Judgment Limited  Confusion None  Danger to Self  Current suicidal ideation? Denies  Self-Injurious Behavior No self-injurious ideation or behavior indicators observed or expressed   Agreement Not to Harm Self Yes  Description of Agreement Verbal  Danger to Others  Danger to Others None reported or observed

## 2022-10-16 DIAGNOSIS — F332 Major depressive disorder, recurrent severe without psychotic features: Secondary | ICD-10-CM | POA: Diagnosis not present

## 2022-10-16 NOTE — Progress Notes (Signed)
Patient is very shaky and expresses high level of anxiety about ECT tomorrow. Patient given support and encouragement. Patient's BP is elevated, 169/99.

## 2022-10-16 NOTE — Group Note (Signed)
Date:  10/16/2022 Time:  5:36 PM  Group Topic/Focus:  Overcoming Stress:   The focus of this group is to define stress and help patients assess their triggers. Personal Choices and Values:   The focus of this group is to help patients assess and explore the importance of values in their lives, how their values affect their decisions, how they express their values and what opposes their expression. Self Care:   The focus of this group is to help patients understand the importance of self-care in order to improve or restore emotional, physical, spiritual, interpersonal, and financial health.    Participation Level:  Active  Participation Quality:  Appropriate  Affect:  Appropriate  Cognitive:  Alert and Appropriate  Insight: Appropriate  Engagement in Group:  Improving  Modes of Intervention:  Exploration and Socialization  Additional Comments:  na  Nolon Bussing 10/16/2022, 5:36 PM

## 2022-10-16 NOTE — Progress Notes (Signed)
[facility] MD Progress Note  10/16/2022 1:26 PM Caitlyn [name]  MRN:  [id number] Subjective: Caitlyn [name] is seen on rounds.  She tells me that she is going to do ECT.  Supposedly, and set up for tomorrow.  Nurses report that there are no issues.  She has been compliant with medications and denies any side effects. Principal Problem: Major depressive disorder, recurrent severe without psychotic features (Caitlyn [name]) Diagnosis: Principal Problem:   Major depressive disorder, recurrent severe without psychotic features (Caitlyn [name])  Total Time spent with patient: 15 minutes  Past Psychiatric History: Depression  Past Medical History:  Past Medical History:  Diagnosis Date   Anxiety    Depression    Hypertension     Past Surgical History:  Procedure Laterality Date   EYE SURGERY     Family History:  Family History  Problem Relation Age of Onset   Breast cancer Neg Hx    Family Psychiatric  History: Unremarkable Social History:  Social History   Substance and Sexual Activity  Alcohol Use Never     Social History   Substance and Sexual Activity  Drug Use Never    Social History   Socioeconomic History   Marital status: Married    Spouse name: Not on file   Number of children: Not on file   Years of education: Not on file   Highest education level: Not on file  Occupational History   Not on file  Tobacco Use   Smoking status: Never   Smokeless tobacco: Never  Vaping Use   Vaping Use: Never used  Substance and Sexual Activity   Alcohol use: Never   Drug use: Never   Sexual activity: Not on file  Other Topics Concern   Not on file  Social History Narrative   Not on file   Social Determinants of Health   Financial Resource Strain: Not on file  Food Insecurity: No Food Insecurity (10/10/2022)   Hunger Vital Sign    Worried About Running Out of Food in the Last Year: Never true    Ran Out of Food in the Last Year: Never true  Transportation Needs: No Transportation Needs (10/10/2022)    PRAPARE - Hydrologist (Medical): No    Lack of Transportation (Non-Medical): No  Physical Activity: Not on file  Stress: Not on file  Social Connections: Not on file   Additional Social History:                         Sleep: Good  Appetite:  Good  Current Medications: Current Facility-Administered Medications  Medication Dose Route Frequency Provider Last Rate Last Admin   acetaminophen (TYLENOL) tablet 650 mg  650 mg Oral Q6H PRN Patrecia Pour, NP       alum & mag hydroxide-simeth (MAALOX/MYLANTA) 200-200-20 MG/5ML suspension 30 mL  30 mL Oral Q4H PRN Patrecia Pour, NP       buPROPion (WELLBUTRIN XL) 24 hr tablet 300 mg  300 mg Oral q morning Patrecia Pour, NP   300 mg at 10/16/22 [id number]   clonazePAM (KLONOPIN) tablet 0.5 mg  0.5 mg Oral BID Clapacs, John T, MD   0.5 mg at 10/16/22 0906   FLUoxetine (PROZAC) capsule 20 mg  20 mg Oral QODAY Clapacs, Madie Reno, MD   20 mg at 10/15/22 0942   FLUoxetine (PROZAC) capsule 40 mg  40 mg Oral QODAY Clapacs, Madie Reno, MD   40 mg  at 10/16/22 0906   lisinopril (ZESTRIL) tablet 10 mg  10 mg Oral Daily Patrecia Pour, NP   10 mg at 10/16/22 [id number]   magnesium hydroxide (MILK OF MAGNESIA) suspension 30 mL  30 mL Oral Daily PRN Patrecia Pour, NP       mirtazapine (REMERON) tablet 30 mg  30 mg Oral QHS Clapacs, Madie Reno, MD   30 mg at 10/15/22 2227   multivitamin with minerals tablet 1 tablet  1 tablet Oral Daily Clapacs, Madie Reno, MD   1 tablet at 10/16/22 0905   OLANZapine (ZYPREXA) tablet 5 mg  5 mg Oral BID PRN Patrecia Pour, NP   5 mg at 10/14/22 1707   QUEtiapine (SEROQUEL) tablet 50 mg  50 mg Oral QHS Patrecia Pour, NP   50 mg at 10/15/22 2227    Lab Results: No results found for this or any previous visit (from the past 48 hour(s)).  Blood Alcohol level:  Lab Results  Component Value Date   ETH <10 10/09/2022   ETH <10 [id number]    Metabolic Disorder Labs: Lab Results  Component Value Date    HGBA1C 4.9 10/12/2022   MPG 93.93 10/12/2022   No results found for: "PROLACTIN" Lab Results  Component Value Date   CHOL 215 (H) 10/12/2022   TRIG 78 10/12/2022   HDL 87 10/12/2022   CHOLHDL 2.5 10/12/2022   VLDL 16 10/12/2022   LDLCALC 112 (H) 10/12/2022    Physical Findings: AIMS:  , ,  ,  ,    CIWA:    COWS:     Musculoskeletal: Strength & Muscle Tone: within normal limits Gait & Station: normal Patient leans: N/A  Psychiatric Specialty Exam:  Presentation  General Appearance:  Casual; Well Groomed  Eye Contact: Good  Speech: Clear and Coherent; Normal Rate  Speech Volume: Normal  Handedness: Right   Mood and Affect  Mood: Anxious; Depressed; Hopeless  Affect: Congruent; Depressed   Thought Process  Thought Processes: Coherent; Goal Directed; Linear  Descriptions of Associations:Intact  Orientation:Full (Time, Place and Person)  Thought Content:Logical  History of Schizophrenia/Schizoaffective disorder:No data recorded Duration of Psychotic Symptoms:No data recorded Hallucinations:No data recorded Ideas of Reference:None  Suicidal Thoughts:No data recorded Homicidal Thoughts:No data recorded  Sensorium  Memory: Immediate Good; Recent Good; Remote Good  Judgment: Impaired  Insight: Fair   Community education officer  Concentration: Good  Attention Span: Good  Recall: Good  Fund of Knowledge: Good  Language: Good   Psychomotor Activity  Psychomotor Activity:No data recorded  Assets  Assets: Communication Skills; Desire for Improvement; Financial Resources/Insurance; Housing; Intimacy; Physical Health   Sleep  Sleep:No data recorded    Blood pressure 126/79, pulse (!) 53, temperature 98.4 F (36.9 C), temperature source Oral, resp. rate 17, height '5\' 2"'$  (1.575 m), weight 49.4 kg, SpO2 97 %. Body mass index is 19.94 kg/m.   Treatment Plan Summary: Daily contact with patient to assess and evaluate symptoms and  progress in treatment, Medication management, and Plan continue current medications.  [place], DO 10/16/2022, 1:26 PM

## 2022-10-16 NOTE — Progress Notes (Signed)
Patient assessed at bedside. She denies SI, HI, and AVH. She reports her depression and anxiety as being "about the same" and rates it as a 6/10. Patient affect is flat and depressed. She reports having a good visit with her son last night. She isolates to her room, but came to the dayroom to eat breakfast. Patient denies pain or other physical problems. She is compliant with scheduled medications and denies medication side effects. Patient remains safe on the unit at this time.

## 2022-10-17 ENCOUNTER — Inpatient Hospital Stay: Payer: Medicare Other | Admitting: Anesthesiology

## 2022-10-17 ENCOUNTER — Other Ambulatory Visit: Payer: Self-pay | Admitting: Psychiatry

## 2022-10-17 ENCOUNTER — Encounter: Payer: Self-pay | Admitting: Psychiatry

## 2022-10-17 ENCOUNTER — Ambulatory Visit: Payer: Medicare Other

## 2022-10-17 DIAGNOSIS — F332 Major depressive disorder, recurrent severe without psychotic features: Secondary | ICD-10-CM | POA: Diagnosis not present

## 2022-10-17 MED ORDER — KETOROLAC TROMETHAMINE 30 MG/ML IJ SOLN
INTRAMUSCULAR | Status: DC | PRN
  Administered 2022-10-17: 15 mg via INTRAVENOUS

## 2022-10-17 MED ORDER — KETAMINE HCL 10 MG/ML IJ SOLN
INTRAMUSCULAR | Status: DC | PRN
  Administered 2022-10-17: 30 mg via INTRAVENOUS

## 2022-10-17 MED ORDER — SODIUM CHLORIDE 0.9 % IV SOLN: INTRAVENOUS | Status: DC | PRN

## 2022-10-17 MED ORDER — LIDOCAINE 2% (20 MG/ML) 5 ML SYRINGE
INTRAMUSCULAR | Status: DC | PRN
  Administered 2022-10-17: 40 mg via INTRAVENOUS

## 2022-10-17 MED ORDER — SUCCINYLCHOLINE CHLORIDE 200 MG/10ML IV SOSY
PREFILLED_SYRINGE | INTRAVENOUS | Status: DC | PRN
  Administered 2022-10-17: 60 mg via INTRAVENOUS

## 2022-10-17 MED ORDER — ONDANSETRON HCL 4 MG/2ML IJ SOLN: 4.0000 mg | Freq: Once | INTRAMUSCULAR | Status: AC | PRN

## 2022-10-17 MED ORDER — ONDANSETRON 4 MG PO TBDP
4.0000 mg | ORAL_TABLET | Freq: Three times a day (TID) | ORAL | Status: DC | PRN
  Administered 2022-10-17: 4 mg via ORAL
  Filled 2022-10-17: qty 1

## 2022-10-17 MED ORDER — LABETALOL HCL 5 MG/ML IV SOLN
INTRAVENOUS | Status: DC | PRN
  Administered 2022-10-17: 5 mg via INTRAVENOUS

## 2022-10-17 MED ORDER — MIDAZOLAM HCL 2 MG/2ML IJ SOLN
INTRAMUSCULAR | Status: AC
  Filled 2022-10-17: qty 2

## 2022-10-17 MED ORDER — LACTATED RINGERS IV SOLN: INTRAVENOUS | Status: DC

## 2022-10-17 MED ORDER — OXYCODONE HCL 5 MG PO TABS: 5.0000 mg | ORAL_TABLET | Freq: Once | ORAL | Status: DC | PRN

## 2022-10-17 MED ORDER — KETAMINE HCL 50 MG/5ML IJ SOSY
PREFILLED_SYRINGE | INTRAMUSCULAR | Status: AC
  Filled 2022-10-17: qty 5

## 2022-10-17 MED ORDER — ACETAMINOPHEN 10 MG/ML IV SOLN: 15.0000 mg/kg | Freq: Once | INTRAVENOUS | Status: AC | PRN

## 2022-10-17 MED ORDER — KETOROLAC TROMETHAMINE 30 MG/ML IJ SOLN: 30.0000 mg | Freq: Once | INTRAMUSCULAR | Status: AC

## 2022-10-17 MED ORDER — PROPOFOL 10 MG/ML IV BOLUS
INTRAVENOUS | Status: DC | PRN
  Administered 2022-10-17 (×2): 20 mg via INTRAVENOUS

## 2022-10-17 MED ORDER — FENTANYL CITRATE PF 50 MCG/ML IJ SOSY: 25.0000 ug | PREFILLED_SYRINGE | INTRAMUSCULAR | Status: DC | PRN

## 2022-10-17 MED ORDER — OXYCODONE HCL 5 MG/5ML PO SOLN: 5.0000 mg | Freq: Once | ORAL | Status: DC | PRN

## 2022-10-17 MED ORDER — SODIUM CHLORIDE 0.9 % IV SOLN
500.0000 mL | Freq: Once | INTRAVENOUS | Status: AC
  Administered 2022-10-19: 500 mL via INTRAVENOUS

## 2022-10-17 MED ORDER — GLYCOPYRROLATE 0.2 MG/ML IJ SOLN: 0.1000 mg | Freq: Once | INTRAMUSCULAR | Status: AC

## 2022-10-17 MED ORDER — KETOROLAC TROMETHAMINE 30 MG/ML IJ SOLN
INTRAMUSCULAR | Status: AC
  Filled 2022-10-17: qty 1

## 2022-10-17 MED ORDER — LABETALOL HCL 5 MG/ML IV SOLN
INTRAVENOUS | Status: AC
  Filled 2022-10-17: qty 4

## 2022-10-17 NOTE — Consult Note (Signed)
  Follow-up ECT: Patient seen this afternoon.  She had a CT this afternoon first treatment right unilateral.  No complications.  Patient states that she had the sensation that she felt some of "the shock".  This is extraordinarily unlikely as she was absolutely asleep before we even gave the paralytic.  Patient also apparently had some bladder problems during the treatment and has not yet changed her undergarment.  Still dysphoric.  She did eat well afterwards.  Expressed support for patient and encouragement.  Advised her that we would continue to tailor treatment in a way that would be most tolerable to her and that we would not expect to see improvement with a single treatment.  Hope that she will continue to be agreeable to treatment next treatment will be scheduled Wednesday.

## 2022-10-17 NOTE — Group Note (Signed)
Date:  10/17/2022 Time:  11:03 AM  Group Topic/Focus:  Overcoming Stress:   The focus of this group is to define stress and help patients assess their triggers. Self Care:   The focus of this group is to help patients understand the importance of self-care in order to improve or restore emotional, physical, spiritual, interpersonal, and financial health.    Participation Level:  Did Not Attend  Participation Quality:  Inattentive  Affect:   Did Not Attend   Cognitive:  Alert  Insight: Appropriate  Engagement in Group:  None  Modes of Intervention:  Activity and Socialization  Additional Comments:    Caitlyn [name] l Caitlyn [name] 10/17/2022, 11:03 AM

## 2022-10-17 NOTE — Progress Notes (Signed)
Patient back from ECT at 2:02 pm. This Probation officer assessed patient. Patient notably anxious. Encouragement provided to help relieve anxiety. All vitals assessed. BP elevated. MD made aware and vitals reassessed.

## 2022-10-17 NOTE — BH IP Treatment Plan (Signed)
Interdisciplinary Treatment and Diagnostic Plan Update  10/17/2022 Time of Session: 9:00AM Caitlyn [name] MRN: [id number]  Principal Diagnosis: Major depressive disorder, recurrent severe without psychotic features ([place])  Secondary Diagnoses: Principal Problem:   Major depressive disorder, recurrent severe without psychotic features ([place])   Current Medications:  Current Facility-Administered Medications  Medication Dose Route Frequency Provider Last Rate Last Admin   acetaminophen (TYLENOL) tablet 650 mg  650 mg Oral Q6H PRN Patrecia Pour, NP       alum & mag hydroxide-simeth (MAALOX/MYLANTA) 200-200-20 MG/5ML suspension 30 mL  30 mL Oral Q4H PRN Patrecia Pour, NP       buPROPion (WELLBUTRIN XL) 24 hr tablet 300 mg  300 mg Oral q morning Patrecia Pour, NP   300 mg at 10/16/22 [id number]   clonazePAM (KLONOPIN) tablet 0.5 mg  0.5 mg Oral BID Clapacs, John T, MD   0.5 mg at 10/16/22 2121   FLUoxetine (PROZAC) capsule 20 mg  20 mg Oral QODAY Clapacs, John T, MD   20 mg at 10/15/22 0942   FLUoxetine (PROZAC) capsule 40 mg  40 mg Oral QODAY Clapacs, John T, MD   40 mg at 10/16/22 0906   lisinopril (ZESTRIL) tablet 10 mg  10 mg Oral Daily Patrecia Pour, NP   10 mg at 10/17/22 0840   magnesium hydroxide (MILK OF MAGNESIA) suspension 30 mL  30 mL Oral Daily PRN Patrecia Pour, NP       mirtazapine (REMERON) tablet 30 mg  30 mg Oral QHS Clapacs, John T, MD   30 mg at 10/16/22 2121   multivitamin with minerals tablet 1 tablet  1 tablet Oral Daily Clapacs, Madie Reno, MD   1 tablet at 10/16/22 0905   OLANZapine (ZYPREXA) tablet 5 mg  5 mg Oral BID PRN Patrecia Pour, NP   5 mg at 10/16/22 1729   QUEtiapine (SEROQUEL) tablet 50 mg  50 mg Oral QHS Patrecia Pour, NP   50 mg at 10/16/22 2120   PTA Medications: Medications Prior to Admission  Medication Sig Dispense Refill Last Dose   buPROPion (WELLBUTRIN XL) 300 MG 24 hr tablet Take 300 mg by mouth every morning.      cycloSPORINE (RESTASIS) 0.05 %  ophthalmic emulsion Place 1 drop into both eyes daily.      FLUoxetine (PROZAC) 20 MG capsule Take 20-40 mg by mouth See admin instructions. Takes 20 mg and 40 mg on alternate days.      lisinopril (ZESTRIL) 10 MG tablet Take 10 mg by mouth daily.      QUEtiapine (SEROQUEL) 50 MG tablet Take 50 mg by mouth at bedtime.       Patient Stressors: Other: just moved here from [facility]    Patient Strengths: Capable of independent living  Communication skills  General fund of knowledge  Supportive family/friends   Treatment Modalities: Medication Management, Group therapy, Case management,  1 to 1 session with clinician, Psychoeducation, Recreational therapy.   Physician Treatment Plan for Primary Diagnosis: Major depressive disorder, recurrent severe without psychotic features ([place]) Long Term Goal(s): Improvement in symptoms so as ready for discharge   Short Term Goals: Compliance with prescribed medications will improve Ability to verbalize feelings will improve Ability to disclose and discuss suicidal ideas Ability to demonstrate self-control will improve Ability to identify and develop effective coping behaviors will improve  Medication Management: Evaluate patient's response, side effects, and tolerance of medication regimen.  Therapeutic Interventions: 1 to 1 sessions, Unit  Group sessions and Medication administration.  Evaluation of Outcomes: Progressing  Physician Treatment Plan for Secondary Diagnosis: Principal Problem:   Major depressive disorder, recurrent severe without psychotic features ([place])  Long Term Goal(s): Improvement in symptoms so as ready for discharge   Short Term Goals: Compliance with prescribed medications will improve Ability to verbalize feelings will improve Ability to disclose and discuss suicidal ideas Ability to demonstrate self-control will improve Ability to identify and develop effective coping behaviors will improve     Medication Management:  Evaluate patient's response, side effects, and tolerance of medication regimen.  Therapeutic Interventions: 1 to 1 sessions, Unit Group sessions and Medication administration.  Evaluation of Outcomes: Progressing   RN Treatment Plan for Primary Diagnosis: Major depressive disorder, recurrent severe without psychotic features ([place]) Long Term Goal(s): Knowledge of disease and therapeutic regimen to maintain health will improve  Short Term Goals: Ability to remain free from injury will improve, Ability to verbalize frustration and anger appropriately will improve, Ability to demonstrate self-control, Ability to participate in decision making will improve, Ability to verbalize feelings will improve, Ability to disclose and discuss suicidal ideas, Ability to identify and develop effective coping behaviors will improve, and Compliance with prescribed medications will improve  Medication Management: RN will administer medications as ordered by provider, will assess and evaluate patient's response and provide education to patient for prescribed medication. RN will report any adverse and/or side effects to prescribing provider.  Therapeutic Interventions: 1 on 1 counseling sessions, Psychoeducation, Medication administration, Evaluate responses to treatment, Monitor vital signs and CBGs as ordered, Perform/monitor CIWA, COWS, AIMS and Fall Risk screenings as ordered, Perform wound care treatments as ordered.  Evaluation of Outcomes: Progressing   LCSW Treatment Plan for Primary Diagnosis: Major depressive disorder, recurrent severe without psychotic features ([place]) Long Term Goal(s): Safe transition to appropriate next level of care at discharge, Engage patient in therapeutic group addressing interpersonal concerns.  Short Term Goals: Engage patient in aftercare planning with referrals and resources, Increase social support, Increase ability to appropriately verbalize feelings, Increase emotional  regulation, Facilitate acceptance of mental health diagnosis and concerns, and Increase skills for wellness and recovery  Therapeutic Interventions: Assess for all discharge needs, 1 to 1 time with Social worker, Explore available resources and support systems, Assess for adequacy in community support network, Educate family and significant other(s) on suicide prevention, Complete Psychosocial Assessment, Interpersonal group therapy.  Evaluation of Outcomes: Progressing   Progress in Treatment: Attending groups: Yes. Participating in groups: Yes. Taking medication as prescribed: Yes. Toleration medication: Yes. Family/Significant other contact made: Yes, individual(s) contacted:  SPE completed with pt's daughter in law, Caitlyn [name] Patient understands diagnosis: Yes. Discussing patient identified problems/goals with staff: Yes. Medical problems stabilized or resolved: Yes. Denies suicidal/homicidal ideation: Yes. Issues/concerns per patient self-inventory: No. Other: None  New problem(s) identified: No, Describe:  None  New Short Term/Long Term Goal(s): Patient to work towards medication management for mood stabilization; elimination of SI thoughts; development of comprehensive mental wellness plan.     Patient Goals:  "find motivation"   Discharge Plan or Barriers: CSW will assist pt with development of appropriate discharge/aftercare plan.    Reason for Continuation of Hospitalization: Anxiety Depression Suicidal ideation   Estimated Length of Stay: TBD  Last 3 [place] Suicide Severity Risk Score: Flowsheet Row Admission (Current) from 10/10/2022 in [facility] ED from 10/09/2022 in [facility] Emergency Department at [facility] ED from 01/07/2022 in [facility] Emergency Department at [facility]  Hospital  C-SSRS RISK CATEGORY High Risk High Risk No Risk       Last PHQ 2/9 Scores:     No data to display          Scribe for  Treatment Team: Valdez Brannan A [place], LCSWA 10/17/2022 9:31 AM

## 2022-10-17 NOTE — H&P (Signed)
Caitlyn [name] is an 77 y.o. female.   Chief Complaint: Extremely anxious dysphoric hopeless. HPI: History of recurrent severe depression unresponsive to medication  Past Medical History:  Diagnosis Date   Anxiety    Depression    Hypertension     Past Surgical History:  Procedure Laterality Date   EYE SURGERY      Family History  Problem Relation Age of Onset   Breast cancer Neg Hx    Social History:  reports that she has never smoked. She has never used smokeless tobacco. She reports that she does not drink alcohol and does not use drugs.  Allergies:  Allergies  Allergen Reactions   Penicillins     Medications Prior to Admission  Medication Sig Dispense Refill   buPROPion (WELLBUTRIN XL) 300 MG 24 hr tablet Take 300 mg by mouth every morning.     cycloSPORINE (RESTASIS) 0.05 % ophthalmic emulsion Place 1 drop into both eyes daily.     FLUoxetine (PROZAC) 20 MG capsule Take 20-40 mg by mouth See admin instructions. Takes 20 mg and 40 mg on alternate days.     lisinopril (ZESTRIL) 10 MG tablet Take 10 mg by mouth daily.     QUEtiapine (SEROQUEL) 50 MG tablet Take 50 mg by mouth at bedtime.      No results found for this or any previous visit (from the past 48 hour(s)). No results found.  Review of Systems  Constitutional: Negative.   HENT: Negative.    Eyes: Negative.   Respiratory: Negative.    Cardiovascular: Negative.   Gastrointestinal: Negative.   Musculoskeletal: Negative.   Skin: Negative.   Neurological: Negative.   Psychiatric/Behavioral:  Positive for dysphoric mood. The patient is nervous/anxious.     Blood pressure 132/73, pulse 63, temperature 98.4 F (36.9 C), temperature source Oral, resp. rate 16, height '5\' 2"'$  (1.575 m), weight 49.4 kg, SpO2 97 %. Physical Exam Vitals reviewed.  Constitutional:      Appearance: She is well-developed.  HENT:     Head: Normocephalic and atraumatic.  Eyes:     Conjunctiva/sclera: Conjunctivae normal.      Pupils: Pupils are equal, round, and reactive to light.  Cardiovascular:     Heart sounds: Normal heart sounds.  Pulmonary:     Effort: Pulmonary effort is normal.  Abdominal:     Palpations: Abdomen is soft.  Musculoskeletal:        General: Normal range of motion.     Cervical back: Normal range of motion.  Skin:    General: Skin is warm and dry.  Neurological:     General: No focal deficit present.     Mental Status: She is alert.  Psychiatric:        Attention and Perception: Attention normal.        Mood and Affect: Mood is depressed.        Speech: Speech is delayed.        Behavior: Behavior is slowed.        Thought Content: Thought content includes suicidal ideation. Thought content does not include suicidal plan.      Assessment/Plan Beginning index course ECT with right unilateral first treatment today.  Alethia Berthold, MD 10/17/2022, 4:32 PM

## 2022-10-17 NOTE — Progress Notes (Signed)
[facility] MD Progress Note  10/17/2022 11:37 AM Caitlyn [name]  MRN:  [id number] Subjective: Caitlyn [name] is seen on rounds.  She is very withdrawn to her room.  She is very anxious about ECT.  I reassured her.  She says that she slept well due to the medications.  She has been compliant with medications and denies any side effects.  There is no evidence of any side effects. Principal Problem: Major depressive disorder, recurrent severe without psychotic features ([place]) Diagnosis: Principal Problem:   Major depressive disorder, recurrent severe without psychotic features ([place])  Total Time spent with patient: 15 minutes  Past Psychiatric History: History of depression.  Past Medical History:  Past Medical History:  Diagnosis Date   Anxiety    Depression    Hypertension     Past Surgical History:  Procedure Laterality Date   EYE SURGERY     Family History:  Family History  Problem Relation Age of Onset   Breast cancer Neg Hx    Family Psychiatric  History: Unremarkable Social History:  Social History   Substance and Sexual Activity  Alcohol Use Never     Social History   Substance and Sexual Activity  Drug Use Never    Social History   Socioeconomic History   Marital status: Married    Spouse name: Not on file   Number of children: Not on file   Years of education: Not on file   Highest education level: Not on file  Occupational History   Not on file  Tobacco Use   Smoking status: Never   Smokeless tobacco: Never  Vaping Use   Vaping Use: Never used  Substance and Sexual Activity   Alcohol use: Never   Drug use: Never   Sexual activity: Not on file  Other Topics Concern   Not on file  Social History Narrative   Not on file   Social Determinants of Health   Financial Resource Strain: Not on file  Food Insecurity: No Food Insecurity (10/10/2022)   Hunger Vital Sign    Worried About Running Out of Food in the Last Year: Never true    Ran Out of Food in the Last Year:  Never true  Transportation Needs: No Transportation Needs (10/10/2022)   PRAPARE - Hydrologist (Medical): No    Lack of Transportation (Non-Medical): No  Physical Activity: Not on file  Stress: Not on file  Social Connections: Not on file   Additional Social History:                         Sleep: Good  Appetite:  Good  Current Medications: Current Facility-Administered Medications  Medication Dose Route Frequency Provider Last Rate Last Admin   acetaminophen (TYLENOL) tablet 650 mg  650 mg Oral Q6H PRN Patrecia Pour, NP       alum & mag hydroxide-simeth (MAALOX/MYLANTA) 200-200-20 MG/5ML suspension 30 mL  30 mL Oral Q4H PRN Patrecia Pour, NP       buPROPion (WELLBUTRIN XL) 24 hr tablet 300 mg  300 mg Oral q morning Patrecia Pour, NP   300 mg at 10/16/22 [id number]   clonazePAM (KLONOPIN) tablet 0.5 mg  0.5 mg Oral BID Clapacs, John T, MD   0.5 mg at 10/16/22 2121   FLUoxetine (PROZAC) capsule 20 mg  20 mg Oral QODAY Clapacs, Madie Reno, MD   20 mg at 10/15/22 0942   FLUoxetine (PROZAC) capsule  40 mg  40 mg Oral QODAY Clapacs, John T, MD   40 mg at 10/16/22 0906   lisinopril (ZESTRIL) tablet 10 mg  10 mg Oral Daily Patrecia Pour, NP   10 mg at 10/17/22 0840   magnesium hydroxide (MILK OF MAGNESIA) suspension 30 mL  30 mL Oral Daily PRN Patrecia Pour, NP       mirtazapine (REMERON) tablet 30 mg  30 mg Oral QHS Clapacs, Madie Reno, MD   30 mg at 10/16/22 2121   multivitamin with minerals tablet 1 tablet  1 tablet Oral Daily Clapacs, Madie Reno, MD   1 tablet at 10/16/22 0905   OLANZapine (ZYPREXA) tablet 5 mg  5 mg Oral BID PRN Patrecia Pour, NP   5 mg at 10/16/22 1729   QUEtiapine (SEROQUEL) tablet 50 mg  50 mg Oral QHS Patrecia Pour, NP   50 mg at 10/16/22 2120    Lab Results: No results found for this or any previous visit (from the past 48 hour(s)).  Blood Alcohol level:  Lab Results  Component Value Date   ETH <10 10/09/2022   ETH <10  [id number]    Metabolic Disorder Labs: Lab Results  Component Value Date   HGBA1C 4.9 10/12/2022   MPG 93.93 10/12/2022   No results found for: "PROLACTIN" Lab Results  Component Value Date   CHOL 215 (H) 10/12/2022   TRIG 78 10/12/2022   HDL 87 10/12/2022   CHOLHDL 2.5 10/12/2022   VLDL 16 10/12/2022   LDLCALC 112 (H) 10/12/2022    Physical Findings: AIMS:  , ,  ,  ,    CIWA:    COWS:     Musculoskeletal: Strength & Muscle Tone: within normal limits Gait & Station: normal Patient leans: N/A  Psychiatric Specialty Exam:  Presentation  General Appearance:  Casual; Well Groomed  Eye Contact: Good  Speech: Clear and Coherent; Normal Rate  Speech Volume: Normal  Handedness: Right   Mood and Affect  Mood: Anxious; Depressed; Hopeless  Affect: Congruent; Depressed   Thought Process  Thought Processes: Coherent; Goal Directed; Linear  Descriptions of Associations:Intact  Orientation:Full (Time, Place and Person)  Thought Content:Logical  History of Schizophrenia/Schizoaffective disorder:No data recorded Duration of Psychotic Symptoms:No data recorded Hallucinations:No data recorded Ideas of Reference:None  Suicidal Thoughts:No data recorded Homicidal Thoughts:No data recorded  Sensorium  Memory: Immediate Good; Recent Good; Remote Good  Judgment: Impaired  Insight: Fair   Community education officer  Concentration: Good  Attention Span: Good  Recall: Good  Fund of Knowledge: Good  Language: Good   Psychomotor Activity  Psychomotor Activity:No data recorded  Assets  Assets: Communication Skills; Desire for Improvement; Financial Resources/Insurance; Housing; Intimacy; Physical Health   Sleep  Sleep:No data recorded   Physical Exam: Physical Exam Vitals and nursing note reviewed.  Constitutional:      Appearance: Normal appearance. She is normal weight.  Neurological:     General: No focal deficit present.      Mental Status: She is alert and oriented to person, place, and time.  Psychiatric:        Attention and Perception: Attention and perception normal.        Mood and Affect: Mood is depressed. Affect is labile.        Speech: Speech normal.        Behavior: Behavior is withdrawn.        Thought Content: Thought content is paranoid.        Cognition  and Memory: Cognition and memory normal.        Judgment: Judgment normal.    Review of Systems  Constitutional: Negative.   HENT: Negative.    Eyes: Negative.   Respiratory: Negative.    Cardiovascular: Negative.   Gastrointestinal: Negative.   Genitourinary: Negative.   Musculoskeletal: Negative.   Skin: Negative.   Neurological: Negative.   Endo/Heme/Allergies: Negative.   Psychiatric/Behavioral:  Positive for depression.    Blood pressure 134/86, pulse (!) 55, temperature 98.6 F (37 C), temperature source Oral, resp. rate (!) 24, height '5\' 2"'$  (1.575 m), weight 49.4 kg, SpO2 95 %. Body mass index is 19.94 kg/m.   Treatment Plan Summary: Daily contact with patient to assess and evaluate symptoms and progress in treatment, Medication management, and Plan continue current medications.  Reassured her about ECT.  Parks Ranger, DO 10/17/2022, 11:37 AM

## 2022-10-17 NOTE — Procedures (Signed)
ECT SERVICES Physician's Interval Evaluation & Treatment Note  Patient Identification: Caitlyn [name] MRN:  [id number] Date of Evaluation:  10/17/2022 TX #: 1  MADRS:   MMSE:   P.E. Findings:  Underweight but otherwise unremarkable  Psychiatric Interval Note:  Very anxious depressed hopeless  Subjective:  Patient is a 77 y.o. female seen for evaluation for Electroconvulsive Therapy. Very negative and hopeless  Treatment Summary:   '[x]'$   Right Unilateral             '[]'$  Bilateral   % Energy : 0.3 ms 80%   Impedance: 1840 ohms  Seizure Energy Index: 6210 V squared  Postictal Suppression Index: 16% but this is inaccurate  Seizure Concordance Index: 83%  Medications  Pre Shock: Robinul 0.1 mg Toradol 30 mg propofol 40 mg ketamine 30 mg succinylcholine 50 mg  Post Shock: Versed 1 mg  Seizure Duration: 8 seconds EMG 21 seconds EEG   Comments: Welte tolerated but on the short side.  May try cutting back on the propofol and going to higher dose ketamine.  Also will continue to try to cut back on Klonopin.  Lungs:  '[x]'$   Clear to auscultation               '[]'$  Other:   Heart:    '[x]'$   Regular rhythm             '[]'$  irregular rhythm    '[x]'$   Previous H&P reviewed, patient examined and there are NO CHANGES                 '[]'$   Previous H&P reviewed, patient examined and there are changes noted.   Alethia Berthold, MD 2/26/20244:34 PM

## 2022-10-17 NOTE — Transfer of Care (Signed)
Immediate Anesthesia Transfer of Care Note  Patient: Caitlyn [name]  Procedure(s) Performed: ECT TX  Patient Location: PACU and Endoscopy Unit  Anesthesia Type:General  Level of Consciousness: awake, drowsy, and patient cooperative  Airway & Oxygen Therapy: Patient Spontanous Breathing  Post-op Assessment: Report given to RN and Post -op Vital signs reviewed and stable  Post vital signs: Reviewed and stable  Last Vitals:  Vitals Value Taken Time  BP 180/92 10/17/22 1317  Temp    Pulse 56 10/17/22 1320  Resp 16 10/17/22 1320  SpO2 97 % 10/17/22 1320  Vitals shown include unvalidated device data.  Last Pain:  Vitals:   10/17/22 1237  TempSrc:   PainSc: 0-No pain         Complications: No notable events documented.

## 2022-10-17 NOTE — Group Note (Signed)
Recreation Therapy Group Note   Group Topic:Communication  Group Date: 10/17/2022 Start Time: 1400 End Time: 1450 Facilitators: Vilma Prader, LRT, CTRS Location: Courtyard  Group Description: [employer]. LRT and NT brought pts outside to the courtyard to get fresh air and sunlight. During the time outside, we tossed around a beach ball that has many different prompts and questions on it while listening to music. After playing, pts had the option to go back inside, to walk around the courtyard, or sit and listen to music.   Affect/Mood: N/A   Participation Level: Did not attend    Clinical Observations/Individualized Feedback: Garla did not attend due to being off the unit.   Plan: Continue to engage patient in RT group sessions 2-3x/week.   [address], LRT, CTRS 10/17/2022 3:10 PM

## 2022-10-17 NOTE — Anesthesia Postprocedure Evaluation (Signed)
Anesthesia Post Note  Patient: Caitlyn [name]  Procedure(s) Performed: ECT TX  Patient location during evaluation: PACU Anesthesia Type: General Level of consciousness: awake and alert, oriented and patient cooperative Pain management: pain level controlled Vital Signs Assessment: post-procedure vital signs reviewed and stable Respiratory status: spontaneous breathing, nonlabored ventilation and respiratory function stable Cardiovascular status: blood pressure returned to baseline and stable Postop Assessment: adequate PO intake Anesthetic complications: no   No notable events documented.   Last Vitals:  Vitals:   10/17/22 1340 10/17/22 1350  BP: (!) 143/88 (!) 147/84  Pulse: (!) 57 (!) 56  Resp: 15 (!) 22  Temp:  (!) 36.1 C  SpO2: 91% 97%    Last Pain:  Vitals:   10/17/22 1317  TempSrc:   PainSc: 0-No pain                 Darrin Nipper

## 2022-10-17 NOTE — Progress Notes (Signed)
D- Patient alert and oriented x 4. Patient endorsing anxiety and depression. Patient denies SI, HI, AVH, and pain. Patient nervous about ECT. Encouragement and relaxation techniques offered to patient. Patient encouraged to socialize with others in the dayroom.   A- Scheduled medications administered to patient, per MD orders. Support and encouragement provided.  Routine safety checks conducted every 15 minutes.  Patient informed to notify staff with problems or concerns.  R- No adverse drug reactions noted. Patient contracts for safety at this time. Patient compliant with medications and treatment plan. Patient receptive, calm, and cooperative. Patient interacts well with others on the unit.  Patient remains safe at this time.    10/17/22 0840  Charting Type  Charting Type Shift assessment  Safety Check Verification  Has the RN verified the 15 minute safety check completion? Yes  Neurological  Neuro (WDL) WDL  Orientation Level Oriented X4  Cognition Appropriate at baseline  Speech Clear  Neuro Symptoms Anxiety;Depression  Neuro symptoms relieved by Relaxation techniques (Comment);Rest;Other (Comment) Regulatory affairs officer spoke with patient on her anxiety to help relieve anxiety around ECT procedure this morning)  HEENT  HEENT (WDL) WDL  Respiratory  Respiratory (WDL) WDL  Cardiac  Cardiac (WDL) WDL  Vascular  Vascular (WDL) WDL  Integumentary  Integumentary (WDL) WDL  Braden Scale (Ages 8 and up)  Sensory Perceptions 4  Moisture 4  Activity 3  Mobility 4  Nutrition 3  Friction and Shear 3  Braden Scale Score 21  Musculoskeletal  Musculoskeletal (WDL) WDL  Assistive Device None  Gastrointestinal  Gastrointestinal (WDL) WDL  Last BM Date  10/16/22  GU Assessment  Genitourinary (WDL) WDL  Neurological  Level of Consciousness Alert

## 2022-10-17 NOTE — Anesthesia Preprocedure Evaluation (Signed)
Anesthesia Evaluation  Patient identified by MRN, date of birth, ID band Patient awake    Reviewed: Allergy & Precautions, NPO status , Patient's Chart, lab work & pertinent test results  History of Anesthesia Complications Negative for: history of anesthetic complications  Airway Mallampati: IV   Neck ROM: Full    Dental no notable dental hx.    Pulmonary neg pulmonary ROS   Pulmonary exam normal breath sounds clear to auscultation       Cardiovascular hypertension, Normal cardiovascular exam Rhythm:Regular Rate:Normal  ECG 10/10/22: Sinus rhythm, PVCs   Neuro/Psych  PSYCHIATRIC DISORDERS Anxiety Depression    negative neurological ROS     GI/Hepatic negative GI ROS,,,  Endo/Other  negative endocrine ROS    Renal/GU negative Renal ROS     Musculoskeletal   Abdominal   Peds  Hematology negative hematology ROS (+)   Anesthesia Other Findings   Reproductive/Obstetrics                             Anesthesia Physical Anesthesia Plan  ASA: 2  Anesthesia Plan: General   Post-op Pain Management:    Induction: Intravenous  PONV Risk Score and Plan: 3 and TIVA and Treatment may vary due to age or medical condition  Airway Management Planned: Mask  Additional Equipment:   Intra-op Plan:   Post-operative Plan:   Informed Consent: I have reviewed the patients History and Physical, chart, labs and discussed the procedure including the risks, benefits and alternatives for the proposed anesthesia with the patient or authorized representative who has indicated his/her understanding and acceptance.       Plan Discussed with: CRNA  Anesthesia Plan Comments: (Serial consent signed.  LMA/GETA backup discussed.  Patient consented for risks of anesthesia including but not limited to:  - adverse reactions to medications - damage to eyes, teeth, lips or other oral mucosa - nerve damage due  to positioning  - sore throat or hoarseness - damage to heart, brain, nerves, lungs, other parts of body or loss of life  Informed patient about role of CRNA in peri- and intra-operative care.  Patient voiced understanding.)       Anesthesia Quick Evaluation

## 2022-10-17 NOTE — Progress Notes (Signed)
   10/16/22 2200  Psych Admission Type (Psych Patients Only)  Admission Status Voluntary  Psychosocial Assessment  Patient Complaints Anxiety;Depression  Eye Contact Fair  Facial Expression Flat;Sad  Affect Appropriate to circumstance  Speech Logical/coherent  Interaction Minimal  Motor Activity Slow  Appearance/Hygiene Unremarkable  Behavior Characteristics Cooperative  Mood Depressed;Anxious  Thought Process  Content WDL  Delusions None reported or observed  Perception WDL  Hallucination None reported or observed  Judgment Impaired  Confusion WDL  Danger to Self  Current suicidal ideation? Denies  Agreement Not to Harm Self Yes  Description of Agreement VERBAL  Danger to Others  Danger to Others None reported or observed   Patient endorsing Depression on 8/10 and anxiety of 8/10 stated she is very anxious about "the procedure I have never had anything like that done before" Patient was reassured. Support and encouragement provided. Compliant with medications no adverse effects noted.

## 2022-10-17 NOTE — Anesthesia Procedure Notes (Signed)
Procedure Name: General with mask airway Date/Time: 10/17/2022 12:55 PM  Performed by: Jerrye Noble, CRNAPre-anesthesia Checklist: Patient identified, Emergency Drugs available, Suction available and Patient being monitored Patient Re-evaluated:Patient Re-evaluated prior to induction Oxygen Delivery Method: Circle system utilized

## 2022-10-17 NOTE — Progress Notes (Signed)
Patient is isolative to her room and rated anxiety and depression 8/10 appears very anxious. Stated " I had a terrible day with the procedure it was very scary. I felt that machine I felt being shaken it was bad" Patient was reassured. Support and encouragement provided. Q 15 minutes safety checks ongoing.

## 2022-10-18 DIAGNOSIS — F332 Major depressive disorder, recurrent severe without psychotic features: Secondary | ICD-10-CM | POA: Diagnosis not present

## 2022-10-18 MED ORDER — OLANZAPINE 5 MG PO TABS
5.0000 mg | ORAL_TABLET | ORAL | Status: DC
  Administered 2022-10-18 – 2022-11-02 (×29): 5 mg via ORAL
  Filled 2022-10-18 (×30): qty 1

## 2022-10-18 MED ORDER — CLONAZEPAM 0.5 MG PO TABS
0.5000 mg | ORAL_TABLET | Freq: Every day | ORAL | Status: DC
  Administered 2022-10-19 – 2022-11-01 (×14): 0.5 mg via ORAL
  Filled 2022-10-18 (×14): qty 1

## 2022-10-18 MED ORDER — ESCITALOPRAM OXALATE 10 MG PO TABS
20.0000 mg | ORAL_TABLET | Freq: Every day | ORAL | Status: DC
  Administered 2022-10-19 – 2022-11-02 (×15): 20 mg via ORAL
  Filled 2022-10-18 (×15): qty 2

## 2022-10-18 MED ORDER — MIRTAZAPINE 15 MG PO TABS
15.0000 mg | ORAL_TABLET | Freq: Every day | ORAL | Status: DC
  Administered 2022-10-18 – 2022-11-01 (×15): 15 mg via ORAL
  Filled 2022-10-18 (×15): qty 1

## 2022-10-18 MED ORDER — LORAZEPAM 1 MG PO TABS: 1.0000 mg | ORAL_TABLET | ORAL | Status: DC | PRN

## 2022-10-18 NOTE — Progress Notes (Signed)
Patient ID: Caitlyn [name], female   DOB: [birth year], 77 y.o.   MRN: [id number] Follow-up ECT service: Patient seen this afternoon.  She had been to group earlier but went to bed feeling tired again.  Patient remains anxious and nervous and a little bit confused.  Dysphoric affect.  Less angry than yesterday and more willing to agree to ECT treatment tomorrow.  Spent time reassuring her and explaining how we would work to minimize her problems that she had with the first treatment.  I am going to change her Klonopin to nighttime only at least for the moment so that she will not get any in the morning.

## 2022-10-18 NOTE — Group Note (Signed)
Date:  10/18/2022 Time:  4:55 PM  Group Topic/Focus:  Goals Group:   The focus of this group is to help patients establish daily goals to achieve during treatment and discuss how the patient can incorporate goal setting into their daily lives to aide in recovery.    Participation Level:  Active  Participation Quality:  Appropriate and Attentive  Affect:  Appropriate  Cognitive:  Alert, Appropriate, and Oriented  Insight: Appropriate and Good  Engagement in Group:  Engaged  Modes of Intervention:  Activity and Discussion  Additional Comments:  [place] goal was to learn and listen more throughout the day.  Ladona Mow 10/18/2022, 4:55 PM

## 2022-10-18 NOTE — Group Note (Signed)
[facility] LCSW Group Therapy Note   Group Date: 10/18/2022 Start Time: 1300 End Time: 1400   Type of Therapy/Topic:  Group Therapy:  Emotion Regulation  Participation Level:  Minimal   Mood:  Description of Group:    The purpose of this group is to assist patients in learning to regulate negative emotions and experience positive emotions. Patients will be guided to discuss ways in which they have been vulnerable to their negative emotions. These vulnerabilities will be juxtaposed with experiences of positive emotions or situations, and patients challenged to use positive emotions to combat negative ones. Special emphasis will be placed on coping with negative emotions in conflict situations, and patients will process healthy conflict resolution skills.  Therapeutic Goals: Patient will identify two positive emotions or experiences to reflect on in order to balance out negative emotions:  Patient will label two or more emotions that they find the most difficult to experience:  Patient will be able to demonstrate positive conflict resolution skills through discussion or role plays:   Summary of Patient Progress:   Patient was present for the entirety of the group session. Patient was an active listener and participated in the topic of discussion, provided helpful advice to others, and added nuance to topic of conversation. She stated she was very indecisive about continuing ECT because the last session made her nauseous. She said she knows it can work but is very anxious that it will make her sick and her depression will remain the same.     Therapeutic Modalities:   Cognitive Behavioral Therapy Feelings Identification Dialectical Behavioral Therapy   Caitlyn [name], LCSWA

## 2022-10-18 NOTE — Progress Notes (Signed)
[facility] MD Progress Note  10/18/2022 10:58 AM Caitlyn [name]  MRN:  [id number] Subjective: Caitlyn [name] is seen on rounds.  She thinks that the ECT is giving her incontinence.  She did have any problems after ECT yesterday but this morning she says she was incontinent of bladder.  She blames ECT for it.  She has very poor insight and is difficult to reassure.  She has been compliant with medications.  She tells me that she was on Lexapro about 3 years ago at a dose of 40 mg and she was taken off of it.  She says that it worked the best.  She does not seem to be responding to this Prozac every other day alternation that she has been on for a year.  She did see Dr. Reece Levy for [place] twice.  She does not want to go back.  She is reluctant to continue with ECT and I reassured her again.  We will see what she does tomorrow.  I told her I was good to make some medication changes.  She did get some relief from the Zyprexa yesterday. Principal Problem: Major depressive disorder, recurrent severe without psychotic features ([place]) Diagnosis: Principal Problem:   Major depressive disorder, recurrent severe without psychotic features ([place])  Total Time spent with patient: 15 minutes  Past Psychiatric History: She saw a psychiatrist in [place] before moving to [place] a year ago.  Past Medical History:  Past Medical History:  Diagnosis Date   Anxiety    Depression    Hypertension     Past Surgical History:  Procedure Laterality Date   EYE SURGERY     Family History:  Family History  Problem Relation Age of Onset   Breast cancer Neg Hx    Family Psychiatric  History: Unremarkable Social History:  Social History   Substance and Sexual Activity  Alcohol Use Never     Social History   Substance and Sexual Activity  Drug Use Never    Social History   Socioeconomic History   Marital status: Married    Spouse name: Not on file   Number of children: Not on file   Years of education: Not on file   Highest  education level: Not on file  Occupational History   Not on file  Tobacco Use   Smoking status: Never   Smokeless tobacco: Never  Vaping Use   Vaping Use: Never used  Substance and Sexual Activity   Alcohol use: Never   Drug use: Never   Sexual activity: Not on file  Other Topics Concern   Not on file  Social History Narrative   Not on file   Social Determinants of Health   Financial Resource Strain: Not on file  Food Insecurity: No Food Insecurity (10/10/2022)   Hunger Vital Sign    Worried About Running Out of Food in the Last Year: Never true    Ran Out of Food in the Last Year: Never true  Transportation Needs: No Transportation Needs (10/10/2022)   PRAPARE - Hydrologist (Medical): No    Lack of Transportation (Non-Medical): No  Physical Activity: Not on file  Stress: Not on file  Social Connections: Not on file   Additional Social History:                         Sleep: Fair  Appetite:  Fair  Current Medications: Current Facility-Administered Medications  Medication Dose Route Frequency Provider  Last Rate Last Admin   0.9 %  sodium chloride infusion  500 mL Intravenous Once Clapacs, Madie Reno, MD       acetaminophen (OFIRMEV) IV 741 mg  15 mg/kg Intravenous Once PRN Darrin Nipper, MD       acetaminophen (TYLENOL) tablet 650 mg  650 mg Oral Q6H PRN Patrecia Pour, NP       alum & mag hydroxide-simeth (MAALOX/MYLANTA) 200-200-20 MG/5ML suspension 30 mL  30 mL Oral Q4H PRN Patrecia Pour, NP       buPROPion (WELLBUTRIN XL) 24 hr tablet 300 mg  300 mg Oral q morning Patrecia Pour, NP   300 mg at 10/18/22 [id number]   clonazePAM (KLONOPIN) tablet 0.5 mg  0.5 mg Oral BID Clapacs, John T, MD   0.5 mg at 10/18/22 0933   [START ON 10/19/2022] escitalopram (LEXAPRO) tablet 20 mg  20 mg Oral QPC breakfast Parks Ranger, DO       fentaNYL (SUBLIMAZE) injection 25-50 mcg  25-50 mcg Intravenous Q5 min PRN Darrin Nipper, MD        glycopyrrolate (ROBINUL) injection 0.1 mg  0.1 mg Intravenous Once Clapacs, Madie Reno, MD       ketorolac (TORADOL) 30 MG/ML injection 30 mg  30 mg Intravenous Once Clapacs, Madie Reno, MD       lactated ringers infusion   Intravenous Continuous Darrin Nipper, MD       lisinopril (ZESTRIL) tablet 10 mg  10 mg Oral Daily Patrecia Pour, NP   10 mg at 10/18/22 0932   LORazepam (ATIVAN) tablet 1 mg  1 mg Oral Q4H PRN Parks Ranger, DO       magnesium hydroxide (MILK OF MAGNESIA) suspension 30 mL  30 mL Oral Daily PRN Patrecia Pour, NP       mirtazapine (REMERON) tablet 15 mg  15 mg Oral QHS Parks Ranger, DO       multivitamin with minerals tablet 1 tablet  1 tablet Oral Daily Clapacs, Madie Reno, MD   1 tablet at 10/18/22 0933   OLANZapine (ZYPREXA) tablet 5 mg  5 mg Oral BH-q8a4p Parks Ranger, DO   5 mg at 10/18/22 1003   ondansetron (ZOFRAN) injection 4 mg  4 mg Intravenous Once PRN Darrin Nipper, MD       ondansetron (ZOFRAN-ODT) disintegrating tablet 4 mg  4 mg Oral Q8H PRN Parks Ranger, DO   4 mg at 10/17/22 1452   oxyCODONE (Oxy IR/ROXICODONE) immediate release tablet 5 mg  5 mg Oral Once PRN Darrin Nipper, MD       Or   oxyCODONE (ROXICODONE) 5 MG/5ML solution 5 mg  5 mg Oral Once PRN Darrin Nipper, MD       QUEtiapine (SEROQUEL) tablet 50 mg  50 mg Oral QHS Patrecia Pour, NP   50 mg at 10/17/22 2053    Lab Results: No results found for this or any previous visit (from the past 48 hour(s)).  Blood Alcohol level:  Lab Results  Component Value Date   ETH <10 10/09/2022   ETH <10 [id number]    Metabolic Disorder Labs: Lab Results  Component Value Date   HGBA1C 4.9 10/12/2022   MPG 93.93 10/12/2022   No results found for: "PROLACTIN" Lab Results  Component Value Date   CHOL 215 (H) 10/12/2022   TRIG 78 10/12/2022   HDL 87 10/12/2022   CHOLHDL 2.5 10/12/2022   VLDL 16 10/12/2022  LDLCALC 112 (H) 10/12/2022    Physical  Findings: AIMS:  , ,  ,  ,    CIWA:    COWS:     Musculoskeletal: Strength & Muscle Tone: within normal limits Gait & Station: normal Patient leans: N/A  Psychiatric Specialty Exam:  Presentation  General Appearance:  Casual; Well Groomed  Eye Contact: Good  Speech: Clear and Coherent; Normal Rate  Speech Volume: Normal  Handedness: Right   Mood and Affect  Mood: Anxious; Depressed; Hopeless  Affect: Congruent; Depressed   Thought Process  Thought Processes: Coherent; Goal Directed; Linear  Descriptions of Associations:Intact  Orientation:Full (Time, Place and Person)  Thought Content:Logical  History of Schizophrenia/Schizoaffective disorder:No data recorded Duration of Psychotic Symptoms:No data recorded Hallucinations:No data recorded Ideas of Reference:None  Suicidal Thoughts:No data recorded Homicidal Thoughts:No data recorded  Sensorium  Memory: Immediate Good; Recent Good; Remote Good  Judgment: Impaired  Insight: Fair   Community education officer  Concentration: Good  Attention Span: Good  Recall: Good  Fund of Knowledge: Good  Language: Good   Psychomotor Activity  Psychomotor Activity:No data recorded  Assets  Assets: Communication Skills; Desire for Improvement; Financial Resources/Insurance; Housing; Intimacy; Physical Health   Sleep  Sleep:No data recorded   Physical Exam: Physical Exam Vitals and nursing note reviewed.  Constitutional:      Appearance: Normal appearance. She is normal weight.  Neurological:     General: No focal deficit present.     Mental Status: She is alert and oriented to person, place, and time.  Psychiatric:        Attention and Perception: Attention normal.        Mood and Affect: Mood is anxious and depressed. Affect is labile.        Speech: Speech normal.        Behavior: Behavior normal. Behavior is cooperative.        Thought Content: Thought content is paranoid.         Cognition and Memory: Cognition and memory normal.        Judgment: Judgment is inappropriate.    Review of Systems  Constitutional: Negative.   HENT: Negative.    Eyes: Negative.   Respiratory: Negative.    Cardiovascular: Negative.   Gastrointestinal: Negative.   Genitourinary: Negative.   Musculoskeletal: Negative.   Skin: Negative.   Neurological: Negative.   Endo/Heme/Allergies: Negative.   Psychiatric/Behavioral:  Positive for depression. The patient is nervous/anxious.    Blood pressure 119/65, pulse (!) 56, temperature 98.4 F (36.9 C), temperature source Oral, resp. rate 18, height '5\' 2"'$  (1.575 m), weight 49.4 kg, SpO2 96 %. Body mass index is 19.94 kg/m.   Treatment Plan Summary: Daily contact with patient to assess and evaluate symptoms and progress in treatment, Medication management, and Plan discontinue Prozac.  Start Lexapro 20 mg in the morning.  Decrease Remeron to 15 mg at bedtime.  Start Zyprexa 5 mg twice a day.  Use Ativan as needed.  Parks Ranger, DO 10/18/2022, 10:58 AM

## 2022-10-18 NOTE — Progress Notes (Signed)
D- Patient alert and oriented x 4. Patient presents with anxiety 5/10 during assessment. Patient hyper-focused on ECT for tomorrow. This Probation officer provided encouragement to the patient to socialize with others on the unit to help take her mind off of ECT. Patient denies SI, HI, AVH, and pain. MD made aware of continued anxiety. New orders placed; see MAR.  A- Scheduled medications administered to patient, per MD orders. Support and encouragement provided.  Routine safety checks conducted every 15 minutes.  Patient informed to notify staff with problems or concerns.  R- No adverse drug reactions noted. Patient contracts for safety at this time. Patient compliant with medications and treatment plan. Patient receptive, calm, and cooperative. Patient interacts well with others on the unit. Patient participated in group. Patient remains safe at this time.   10/18/22 0726  Charting Type  Charting Type Shift assessment  Safety Check Verification  Has the RN verified the 15 minute safety check completion? Yes  Neurological  Neuro (WDL) WDL  Orientation Level Oriented X4  Cognition Appropriate at baseline  Speech Clear  Neuro Symptoms Anxiety  HEENT  HEENT (WDL) X  Respiratory  Respiratory (WDL) WDL  Respiratory Pattern Regular;Unlabored  Chest Assessment Chest expansion symmetrical  Cardiac  Cardiac (WDL) X  Pulse Irregular  Vascular  Vascular (WDL) WDL  Integumentary  Integumentary (WDL) WDL  Braden Scale (Ages 8 and up)  Sensory Perceptions 4  Moisture 4  Activity 3  Mobility 4  Nutrition 2  Friction and Shear 3  Braden Scale Score 20  Musculoskeletal  Musculoskeletal (WDL) WDL  Assistive Device None  Gastrointestinal  Gastrointestinal (WDL) WDL  Last BM Date  10/18/22  GU Assessment  Genitourinary (WDL) WDL  Neurological  Level of Consciousness Alert

## 2022-10-18 NOTE — Group Note (Signed)
Recreation Therapy Group Note   Group Topic:Healthy Support Systems  Group Date: 10/18/2022 Start Time: 1400 End Time: 1455 Facilitators: Vilma Prader, LRT, CTRS Location:  Dayroom  Group Description: Straw Bridge. Individually, patients were given 10 plastic drinking straws and an equal length of masking tape. Using the materials provided, patients were instructed to build a free-standing bridge-like structure to suspend an everyday item (ex: puzzle box) off the floor or table surface. All materials were required to be used in Conservation officer, nature. LRT facilitated post-activity discussion reviewing how we, humans, are like the structure we built; when things get too heavy in our life and we do not have adequate supports/coping skills, then we will fall just like the straw-built structure will. LRT focused on how having a "base" or structure on the bottom was necessary for the object to stand, meaning we must be secure and stable first before building on ourselves or others. Patients were encouraged to reflect how the skills used in this activity can be generalized to daily life post discharge.  Affect/Mood: Appropriate and Euthymic   Participation Level: Active and Engaged   Participation Quality: Independent   Behavior: Appropriate and Calm   Speech/Thought Process: Coherent   Insight: Good   Judgement: Fair    Modes of Intervention: Activity   Patient Response to Interventions:  Attentive and Interested    Education Outcome:  Acknowledges education   Clinical Observations/Individualized Feedback: Caitlyn [name] was active in their participation of session activities and group discussion. Pt required much encouragement from LRT to attend group. Pt was hesitant and put herself down saying "I don't think I will be able to do that."  Pt chose to work independently for the activity section of group. Pt shared that she hadn't worked with "building blocks or anything of that nature in a while and this  was a fun exercise to complete". Pt ended up creating two different style structures once she looked at a peers.    Plan: Continue to engage patient in RT group sessions 2-3x/week.   Vilma Prader, LRT, CTRS 10/18/2022 3:07 PM

## 2022-10-19 ENCOUNTER — Inpatient Hospital Stay: Payer: Medicare Other | Admitting: Certified Registered"

## 2022-10-19 ENCOUNTER — Ambulatory Visit: Payer: Medicare Other

## 2022-10-19 ENCOUNTER — Encounter: Payer: Self-pay | Admitting: Psychiatry

## 2022-10-19 DIAGNOSIS — F332 Major depressive disorder, recurrent severe without psychotic features: Secondary | ICD-10-CM | POA: Diagnosis not present

## 2022-10-19 MED ORDER — SUCCINYLCHOLINE CHLORIDE 200 MG/10ML IV SOSY
PREFILLED_SYRINGE | INTRAVENOUS | Status: DC | PRN
  Administered 2022-10-19: 60 mg via INTRAVENOUS

## 2022-10-19 MED ORDER — LIDOCAINE 2% (20 MG/ML) 5 ML SYRINGE
INTRAMUSCULAR | Status: DC | PRN
  Administered 2022-10-19: 20 mg via INTRAVENOUS

## 2022-10-19 MED ORDER — PROPOFOL 10 MG/ML IV BOLUS
INTRAVENOUS | Status: DC | PRN
  Administered 2022-10-19: 30 mg via INTRAVENOUS

## 2022-10-19 MED ORDER — MIDAZOLAM HCL 2 MG/2ML IJ SOLN
INTRAMUSCULAR | Status: AC
  Filled 2022-10-19: qty 2

## 2022-10-19 MED ORDER — KETAMINE HCL 50 MG/5ML IJ SOSY
PREFILLED_SYRINGE | INTRAMUSCULAR | Status: AC
  Filled 2022-10-19: qty 5

## 2022-10-19 MED ORDER — ONDANSETRON HCL 4 MG/2ML IJ SOLN
INTRAMUSCULAR | Status: AC
  Administered 2022-10-19: 4 mg via INTRAVENOUS
  Filled 2022-10-19: qty 2

## 2022-10-19 MED ORDER — KETAMINE HCL 10 MG/ML IJ SOLN
INTRAMUSCULAR | Status: DC | PRN
  Administered 2022-10-19: 40 mg via INTRAVENOUS

## 2022-10-19 MED ORDER — OXYCODONE HCL 5 MG/5ML PO SOLN: 5.0000 mg | Freq: Once | ORAL | Status: DC | PRN

## 2022-10-19 MED ORDER — OXYCODONE HCL 5 MG PO TABS: 5.0000 mg | ORAL_TABLET | Freq: Once | ORAL | Status: DC | PRN

## 2022-10-19 MED ORDER — MIDAZOLAM HCL 2 MG/2ML IJ SOLN
INTRAMUSCULAR | Status: DC | PRN
  Administered 2022-10-19: 4 mg via INTRAVENOUS

## 2022-10-19 MED ORDER — FENTANYL CITRATE PF 50 MCG/ML IJ SOSY: 25.0000 ug | PREFILLED_SYRINGE | INTRAMUSCULAR | Status: DC | PRN

## 2022-10-19 MED ORDER — KETOROLAC TROMETHAMINE 30 MG/ML IJ SOLN
INTRAMUSCULAR | Status: AC
  Administered 2022-10-19: 30 mg via INTRAVENOUS
  Filled 2022-10-19: qty 1

## 2022-10-19 MED ORDER — GLYCOPYRROLATE 0.2 MG/ML IJ SOLN
INTRAMUSCULAR | Status: AC
  Administered 2022-10-19: 0.1 mg via INTRAVENOUS
  Filled 2022-10-19: qty 1

## 2022-10-19 MED ORDER — LABETALOL HCL 5 MG/ML IV SOLN
INTRAVENOUS | Status: DC | PRN
  Administered 2022-10-19 (×2): 5 mg via INTRAVENOUS

## 2022-10-19 NOTE — Progress Notes (Signed)
[facility] MD Progress Note  10/19/2022 1:29 PM Caitlyn [name]  MRN:  [id number] Subjective: Caitlyn [name] is seen on rounds.  She is back from ECT.  She is very negative and feels that it has not helped.  I told her that she needs to give it more time.  I made some medication changes yesterday which included discontinuing her Prozac and restarting her on Lexapro and scheduling Zyprexa and decreasing Remeron for sleep and adding Ativan as needed for anxiety.  She could benefit mostly from CBT therapy and not medications.  She has a very negative view on life.  She been compliant with medications and no side effects.  She is sleeping and eating well. Principal Problem: Major depressive disorder, recurrent severe without psychotic features ([place]) Diagnosis: Principal Problem:   Major depressive disorder, recurrent severe without psychotic features ([place])  Total Time spent with patient: 15 minutes  Past Psychiatric History: She saw a psychiatrist in [place] before moving to [place] and has seen Dr. Reece Levy and received 2 [place] treatments.  Past Medical History:  Past Medical History:  Diagnosis Date   Anxiety    Depression    Hypertension     Past Surgical History:  Procedure Laterality Date   EYE SURGERY     Family History:  Family History  Problem Relation Age of Onset   Breast cancer Neg Hx    Family Psychiatric  History: Unremarkable Social History:  Social History   Substance and Sexual Activity  Alcohol Use Never     Social History   Substance and Sexual Activity  Drug Use Never    Social History   Socioeconomic History   Marital status: Married    Spouse name: Not on file   Number of children: Not on file   Years of education: Not on file   Highest education level: Not on file  Occupational History   Not on file  Tobacco Use   Smoking status: Never   Smokeless tobacco: Never  Vaping Use   Vaping Use: Never used  Substance and Sexual Activity   Alcohol use: Never   Drug use:  Never   Sexual activity: Not on file  Other Topics Concern   Not on file  Social History Narrative   Not on file   Social Determinants of Health   Financial Resource Strain: Not on file  Food Insecurity: No Food Insecurity (10/10/2022)   Hunger Vital Sign    Worried About Running Out of Food in the Last Year: Never true    Ran Out of Food in the Last Year: Never true  Transportation Needs: No Transportation Needs (10/10/2022)   PRAPARE - Hydrologist (Medical): No    Lack of Transportation (Non-Medical): No  Physical Activity: Not on file  Stress: Not on file  Social Connections: Not on file   Additional Social History:                         Sleep: Good  Appetite:  Good  Current Medications: Current Facility-Administered Medications  Medication Dose Route Frequency Provider Last Rate Last Admin   acetaminophen (TYLENOL) tablet 650 mg  650 mg Oral Q6H PRN Patrecia Pour, NP       alum & mag hydroxide-simeth (MAALOX/MYLANTA) 200-200-20 MG/5ML suspension 30 mL  30 mL Oral Q4H PRN Patrecia Pour, NP       buPROPion (WELLBUTRIN XL) 24 hr tablet 300 mg  300 mg  Oral q morning Patrecia Pour, NP   300 mg at 10/18/22 [id number]   clonazePAM (KLONOPIN) tablet 0.5 mg  0.5 mg Oral QHS Clapacs, Madie Reno, MD       escitalopram (LEXAPRO) tablet 20 mg  20 mg Oral QPC breakfast Parks Ranger, DO       fentaNYL (SUBLIMAZE) injection 25-50 mcg  25-50 mcg Intravenous Q5 min PRN Darrin Nipper, MD       fentaNYL (SUBLIMAZE) injection 25-50 mcg  25-50 mcg Intravenous Q5 min PRN Dimas Millin, MD       lactated ringers infusion   Intravenous Continuous Darrin Nipper, MD       lisinopril (ZESTRIL) tablet 10 mg  10 mg Oral Daily Patrecia Pour, NP   10 mg at 10/19/22 [id number]   LORazepam (ATIVAN) tablet 1 mg  1 mg Oral Q4H PRN Parks Ranger, DO       magnesium hydroxide (MILK OF MAGNESIA) suspension 30 mL  30 mL Oral Daily PRN Patrecia Pour,  NP       mirtazapine (REMERON) tablet 15 mg  15 mg Oral QHS Parks Ranger, DO   15 mg at 10/18/22 2127   multivitamin with minerals tablet 1 tablet  1 tablet Oral Daily Clapacs, Madie Reno, MD   1 tablet at 10/18/22 0933   OLANZapine (ZYPREXA) tablet 5 mg  5 mg Oral BH-q8a4p Parks Ranger, DO   5 mg at 10/18/22 1548   ondansetron (ZOFRAN) injection 4 mg  4 mg Intravenous Once PRN Darrin Nipper, MD       ondansetron (ZOFRAN-ODT) disintegrating tablet 4 mg  4 mg Oral Q8H PRN Parks Ranger, DO   4 mg at 10/17/22 1452   oxyCODONE (Oxy IR/ROXICODONE) immediate release tablet 5 mg  5 mg Oral Once PRN Darrin Nipper, MD       Or   oxyCODONE (ROXICODONE) 5 MG/5ML solution 5 mg  5 mg Oral Once PRN Darrin Nipper, MD       oxyCODONE (Oxy IR/ROXICODONE) immediate release tablet 5 mg  5 mg Oral Once PRN Dimas Millin, MD       Or   oxyCODONE (ROXICODONE) 5 MG/5ML solution 5 mg  5 mg Oral Once PRN Dimas Millin, MD       QUEtiapine (SEROQUEL) tablet 50 mg  50 mg Oral QHS Patrecia Pour, NP   50 mg at 10/18/22 2127    Lab Results: No results found for this or any previous visit (from the past 48 hour(s)).  Blood Alcohol level:  Lab Results  Component Value Date   ETH <10 10/09/2022   ETH <10 [id number]    Metabolic Disorder Labs: Lab Results  Component Value Date   HGBA1C 4.9 10/12/2022   MPG 93.93 10/12/2022   No results found for: "PROLACTIN" Lab Results  Component Value Date   CHOL 215 (H) 10/12/2022   TRIG 78 10/12/2022   HDL 87 10/12/2022   CHOLHDL 2.5 10/12/2022   VLDL 16 10/12/2022   LDLCALC 112 (H) 10/12/2022    Physical Findings: AIMS:  , ,  ,  ,    CIWA:    COWS:     Musculoskeletal: Strength & Muscle Tone: within normal limits Gait & Station: normal Patient leans: N/A  Psychiatric Specialty Exam:  Presentation  General Appearance:  Casual; Well Groomed  Eye Contact: Good  Speech: Clear and Coherent; Normal Rate  Speech  Volume: Normal  Handedness: Right   Mood and Affect  Mood: Anxious; Depressed; Hopeless  Affect: Congruent; Depressed   Thought Process  Thought Processes: Coherent; Goal Directed; Linear  Descriptions of Associations:Intact  Orientation:Full (Time, Place and Person)  Thought Content:Logical  History of Schizophrenia/Schizoaffective disorder:No data recorded Duration of Psychotic Symptoms:No data recorded Hallucinations:No data recorded Ideas of Reference:None  Suicidal Thoughts:No data recorded Homicidal Thoughts:No data recorded  Sensorium  Memory: Immediate Good; Recent Good; Remote Good  Judgment: Impaired  Insight: Fair   Community education officer  Concentration: Good  Attention Span: Good  Recall: Good  Fund of Knowledge: Good  Language: Good   Psychomotor Activity  Psychomotor Activity:No data recorded  Assets  Assets: Communication Skills; Desire for Improvement; Financial Resources/Insurance; Housing; Intimacy; Physical Health   Sleep  Sleep:No data recorded   Physical Exam: Physical Exam Vitals and nursing note reviewed.  Constitutional:      Appearance: Normal appearance. She is normal weight.  Neurological:     General: No focal deficit present.     Mental Status: She is alert and oriented to person, place, and time.  Psychiatric:        Attention and Perception: Attention and perception normal.        Mood and Affect: Mood is depressed. Affect is labile.        Speech: Speech normal.        Behavior: Behavior normal. Behavior is cooperative.        Thought Content: Thought content normal.        Cognition and Memory: Cognition and memory normal.        Judgment: Judgment is inappropriate.    Review of Systems  Constitutional: Negative.   HENT: Negative.    Eyes: Negative.   Respiratory: Negative.    Cardiovascular: Negative.   Gastrointestinal: Negative.   Genitourinary: Negative.   Musculoskeletal: Negative.    Skin: Negative.   Neurological: Negative.   Endo/Heme/Allergies: Negative.   Psychiatric/Behavioral: Negative.     Blood pressure (!) 172/94, pulse (!) 46, temperature 97.6 F (36.4 C), resp. rate 14, height '5\' 2"'$  (1.575 m), weight 49.4 kg, SpO2 98 %. Body mass index is 19.94 kg/m.   Treatment Plan Summary: Daily contact with patient to assess and evaluate symptoms and progress in treatment, Medication management, and Plan continue current medications.  Parks Ranger, DO 10/19/2022, 1:29 PM

## 2022-10-19 NOTE — Group Note (Signed)
Recreation Therapy Group Note   Group Topic:General Recreation  Group Date: 10/19/2022 Start Time: 1400 End Time: 1445 Facilitators: Vilma Prader, LRT, CTRS Location:  Dayroom  Group Description: Trivia. Patients are split into two groups. LRT reads off trivia question for one team to answer within allotted time. If the first team is unable to answer or answers incorrectly, then the opposite team gets a chance at answering the question. At the end of the game, whichever team answers the most question correctly, wins. LRT facilitated post-game discussion on the importance of working well with others, active listening to others and being a part of a team. LRT and pts discussed how this can apply to life post-discharge.   Affect/Mood: Congruent and Happy   Participation Level: Active and Engaged   Participation Quality: Minimal Cues   Behavior: Appropriate and Calm   Speech/Thought Process: Coherent   Insight: Good   Judgement: Fair    Modes of Intervention: Activity   Patient Response to Interventions:  Attentive, Engaged, Interested , and Receptive   Education Outcome:  Acknowledges education   Clinical Observations/Individualized Feedback: Caitlyn [name] was active in their participation of session activities and group discussion. Pt shared that she ran a marathon in [place] in [place] in the 90's with her husband. Pt did very well with trivia and answered almost all of the questions correctly. Pt was noticeably more interactive and talkative with peers and LRT.    Plan: Continue to engage patient in RT group sessions 2-3x/week.   Vilma Prader, LRT, CTRS 10/19/2022 3:00 PM

## 2022-10-19 NOTE — Anesthesia Postprocedure Evaluation (Signed)
Anesthesia Post Note  Patient: Caitlyn [name]  Procedure(s) Performed: ECT TX  Patient location during evaluation: PACU Anesthesia Type: General Level of consciousness: awake and alert Pain management: pain level controlled Vital Signs Assessment: post-procedure vital signs reviewed and stable Respiratory status: spontaneous breathing, nonlabored ventilation, respiratory function stable and patient connected to nasal cannula oxygen Cardiovascular status: blood pressure returned to baseline and stable Postop Assessment: no apparent nausea or vomiting Anesthetic complications: no  No notable events documented.   Last Vitals:  Vitals:   10/19/22 1255 10/19/22 1300  BP: (!) 177/82 (!) 172/94  Pulse: (!) 47 (!) 46  Resp: 16 14  Temp: 36.4 C   SpO2: 97% 98%    Last Pain:  Vitals:   10/19/22 1255  TempSrc:   PainSc: [place]

## 2022-10-19 NOTE — Group Note (Signed)
LCSW Group Therapy Note  Group Date: 10/19/2022 Start Time: 1315 End Time: 1400   Type of Therapy and Topic:  Group Therapy - Healthy vs Unhealthy Coping Skills  Participation Level:  Did Not Attend   Description of Group The focus of this group was to determine what unhealthy coping techniques typically are used by group members and what healthy coping techniques would be helpful in coping with various problems. Patients were guided in becoming aware of the differences between healthy and unhealthy coping techniques. Patients were asked to identify 2-3 healthy coping skills they would like to learn to use more effectively.  Therapeutic Goals Patients learned that coping is what human beings do all day long to deal with various situations in their lives Patients defined and discussed healthy vs unhealthy coping techniques Patients identified their preferred coping techniques and identified whether these were healthy or unhealthy Patients determined 2-3 healthy coping skills they would like to become more familiar with and use more often. Patients provided support and ideas to each other   Summary of Patient Progress:  X  Therapeutic Modalities Cognitive Behavioral Therapy Motivational Interviewing  [place] Steidle A [place], LCSWA 10/19/2022  2:33 PM

## 2022-10-19 NOTE — Plan of Care (Signed)
  Problem: Clinical Measurements: Goal: Ability to maintain clinical measurements within normal limits will improve Outcome: Progressing Goal: Will remain free from infection Outcome: Progressing Goal: Diagnostic test results will improve Outcome: Progressing Goal: Respiratory complications will improve Outcome: Progressing Goal: Cardiovascular complication will be avoided Outcome: Progressing   Problem: Nutrition: Goal: Adequate nutrition will be maintained Outcome: Not Progressing   Problem: Coping: Goal: Level of anxiety will decrease Outcome: Not Progressing   Problem: Pain Managment: Goal: General experience of comfort will improve Outcome: Not Progressing

## 2022-10-19 NOTE — Progress Notes (Signed)
D- Patient alert and oriented x 4. Patient highly anxious during assessment. Patient denies SI, HI, AVH, and pain. Patient nervous about ECT. Patient states her goal is not not thinking about ECT as much but finds it difficult. Writer encouraged patient to come out of her room and socialize with others on the unit.   A- Scheduled medications administered to patient, per MD orders. Support and encouragement provided.  Routine safety checks conducted every 15 minutes.  Patient informed to notify staff with problems or concerns.  R- No adverse drug reactions noted. Patient contracts for safety at this time. Patient compliant with medications and treatment plan. Patient receptive, calm, and cooperative. Patient interacts well with others on the unit. Patient participated in the group sessions.Patient remains safe at this time.   10/19/22 0832  Charting Type  Charting Type Shift assessment  Safety Check Verification  Has the RN verified the 15 minute safety check completion? Yes  Neurological  Neuro (WDL) WDL  Orientation Level Oriented X4  Cognition Appropriate at baseline  Speech Clear  Neuro Symptoms Anxiety  Neuro symptoms relieved by Other (Comment);Relaxation techniques (Comment) (unrelieved per patient)  HEENT  HEENT (WDL) WDL  Respiratory  Respiratory (WDL) WDL  Respiratory Pattern Regular;Unlabored  Chest Assessment Chest expansion symmetrical  Cardiac  Cardiac (WDL) WDL  Vascular  Vascular (WDL) WDL  Integumentary  Integumentary (WDL) WDL  Skin Color Appropriate for ethnicity  Braden Scale (Ages 8 and up)  Sensory Perceptions 4  Moisture 4  Activity 3  Mobility 4  Nutrition 2  Friction and Shear 3  Braden Scale Score 20  Musculoskeletal  Musculoskeletal (WDL) WDL  Assistive Device None  Gastrointestinal  Gastrointestinal (WDL) WDL  GU Assessment  Genitourinary (WDL) WDL  Neurological  Level of Consciousness Alert

## 2022-10-19 NOTE — Procedures (Signed)
ECT SERVICES Physician's Interval Evaluation & Treatment Note  Patient Identification: Caitlyn [name] MRN:  [id number] Date of Evaluation:  10/19/2022 TX #: 2  MADRS:   MMSE:   P.E. Findings:  No change to physical exam  Psychiatric Interval Note:  Seems less anxious although still talks about being very nervous and afraid  Subjective:  Patient is a 77 y.o. female seen for evaluation for Electroconvulsive Therapy. Not much different subjectively  Treatment Summary:   '[x]'$   Right Unilateral             '[]'$  Bilateral   % Energy : 0.3 ms 100%   Impedance: 2360 ohms  Seizure Energy Index: 13,560 V squared  Postictal Suppression Index: 56%  Seizure Concordance Index: 81%  Medications  Pre Shock: Lidocaine 40 mg Robinul 0.1 mg Toradol 30 mg propofol 30 mg ketamine 40 mg succinylcholine 60 mg  Post Shock: Versed 4 mg  Seizure Duration: EMG 13 seconds EEG 38 seconds   Comments: Good quality no side effects.  Follow-up Friday  Lungs:  '[x]'$   Clear to auscultation               '[]'$  Other:   Heart:    '[x]'$   Regular rhythm             '[]'$  irregular rhythm    '[x]'$   Previous H&P reviewed, patient examined and there are NO CHANGES                 '[]'$   Previous H&P reviewed, patient examined and there are changes noted.   Alethia Berthold, MD 2/28/20242:44 PM

## 2022-10-19 NOTE — Anesthesia Preprocedure Evaluation (Signed)
Anesthesia Evaluation  Patient identified by MRN, date of birth, ID band Patient awake    Reviewed: Allergy & Precautions, NPO status , Patient's Chart, lab work & pertinent test results  History of Anesthesia Complications Negative for: history of anesthetic complications  Airway Mallampati: IV   Neck ROM: Full    Dental no notable dental hx.    Pulmonary neg pulmonary ROS   Pulmonary exam normal breath sounds clear to auscultation       Cardiovascular hypertension, Normal cardiovascular exam Rhythm:Regular Rate:Normal  ECG 10/10/22: Sinus rhythm, PVCs   Neuro/Psych  PSYCHIATRIC DISORDERS Anxiety Depression    negative neurological ROS     GI/Hepatic negative GI ROS,,,  Endo/Other  negative endocrine ROS    Renal/GU negative Renal ROS     Musculoskeletal   Abdominal   Peds  Hematology negative hematology ROS (+)   Anesthesia Other Findings   Reproductive/Obstetrics                              Anesthesia Physical Anesthesia Plan  ASA: 2  Anesthesia Plan: General   Post-op Pain Management:    Induction: Intravenous  PONV Risk Score and Plan: 3 and TIVA and Treatment may vary due to age or medical condition  Airway Management Planned: Mask  Additional Equipment:   Intra-op Plan:   Post-operative Plan:   Informed Consent: I have reviewed the patients History and Physical, chart, labs and discussed the procedure including the risks, benefits and alternatives for the proposed anesthesia with the patient or authorized representative who has indicated his/her understanding and acceptance.       Plan Discussed with: CRNA  Anesthesia Plan Comments: (Serial consent signed.  LMA/GETA backup discussed.  Patient consented for risks of anesthesia including but not limited to:  - adverse reactions to medications - damage to eyes, teeth, lips or other oral mucosa - nerve damage due  to positioning  - sore throat or hoarseness - damage to heart, brain, nerves, lungs, other parts of body or loss of life  Informed patient about role of CRNA in peri- and intra-operative care.  Patient voiced understanding.)        Anesthesia Quick Evaluation

## 2022-10-19 NOTE — Progress Notes (Signed)
Patient back on unit at 1:47 pm. Patient assessed. BP elevated: 170/114 and 159/68 at recheck. Patient denies any pain. Writer will continue to monitor patient. Patient in the dayroom in group with others on the unit.

## 2022-10-19 NOTE — Group Note (Signed)
Date:  10/19/2022 Time:  10:20 AM  Group Topic/Focus:  Personal Choices and Values:   The focus of this group is to help patients assess and explore the importance of values in their lives, how their values affect their decisions, how they express their values and what opposes their expression.    Participation Level:  Active  Participation Quality:  Appropriate  Affect:  Appropriate  Cognitive:  Alert and Appropriate  Insight: Appropriate  Engagement in Group:  Engaged  Modes of Intervention:  Discussion  Additional Comments:    Ladona Mow 10/19/2022, 10:20 AM

## 2022-10-19 NOTE — Progress Notes (Signed)
Patient transferred from unit to ECT by ECT tech. Patient alert and oriented at time of transfer.

## 2022-10-19 NOTE — H&P (Signed)
Caitlyn [name] is an 77 y.o. female.   Chief Complaint: Ongoing anxiety and depression no new specific complaint no specific physical concern HPI: Recurrent severe depression beginning ECT course  Past Medical History:  Diagnosis Date   Anxiety    Depression    Hypertension     Past Surgical History:  Procedure Laterality Date   EYE SURGERY      Family History  Problem Relation Age of Onset   Breast cancer Neg Hx    Social History:  reports that she has never smoked. She has never used smokeless tobacco. She reports that she does not drink alcohol and does not use drugs.  Allergies:  Allergies  Allergen Reactions   Penicillins     Medications Prior to Admission  Medication Sig Dispense Refill   buPROPion (WELLBUTRIN XL) 300 MG 24 hr tablet Take 300 mg by mouth every morning.     cycloSPORINE (RESTASIS) 0.05 % ophthalmic emulsion Place 1 drop into both eyes daily.     FLUoxetine (PROZAC) 20 MG capsule Take 20-40 mg by mouth See admin instructions. Takes 20 mg and 40 mg on alternate days.     lisinopril (ZESTRIL) 10 MG tablet Take 10 mg by mouth daily.     QUEtiapine (SEROQUEL) 50 MG tablet Take 50 mg by mouth at bedtime.      No results found for this or any previous visit (from the past 48 hour(s)). No results found.  Review of Systems  Constitutional: Negative.   HENT: Negative.    Eyes: Negative.   Respiratory: Negative.    Cardiovascular: Negative.   Gastrointestinal: Negative.   Musculoskeletal: Negative.   Skin: Negative.   Neurological: Negative.   Psychiatric/Behavioral:  Positive for dysphoric mood. The patient is nervous/anxious.   All other systems reviewed and are negative.   Blood pressure (!) 142/91, pulse (!) 55, temperature 98.7 F (37.1 C), temperature source Oral, resp. rate 18, height '5\' 2"'$  (1.575 m), weight 49.4 kg, SpO2 98 %. Physical Exam Vitals and nursing note reviewed.  Constitutional:      Appearance: She is well-developed.  HENT:      Head: Normocephalic and atraumatic.  Eyes:     Conjunctiva/sclera: Conjunctivae normal.     Pupils: Pupils are equal, round, and reactive to light.  Cardiovascular:     Heart sounds: Normal heart sounds.  Pulmonary:     Effort: Pulmonary effort is normal.  Abdominal:     Palpations: Abdomen is soft.  Musculoskeletal:        General: Normal range of motion.     Cervical back: Normal range of motion.  Skin:    General: Skin is warm and dry.  Neurological:     General: No focal deficit present.     Mental Status: She is alert.  Psychiatric:        Mood and Affect: Mood is anxious and depressed.        Speech: Speech is delayed.      Assessment/Plan ECT today continue index course next treatment Friday  Alethia Berthold, MD 10/19/2022, 12:17 PM

## 2022-10-19 NOTE — Transfer of Care (Signed)
Immediate Anesthesia Transfer of Care Note  Patient: Caitlyn [name]  Procedure(s) Performed: ECT TX  Patient Location: PACU  Anesthesia Type:General  Level of Consciousness: drowsy  Airway & Oxygen Therapy: Patient Spontanous Breathing and Patient connected to nasal cannula oxygen  Post-op Assessment: Report given to RN  Post vital signs: stable  Last Vitals:  Vitals Value Taken Time  BP 177/82 10/19/22 1255  Temp 36.4 C 10/19/22 1255  Pulse 45 10/19/22 1257  Resp 15 10/19/22 1257  SpO2 97 % 10/19/22 1257  Vitals shown include unvalidated device data.  Last Pain:  Vitals:   10/19/22 1135  TempSrc: Oral  PainSc: 0-No pain         Complications: No notable events documented.

## 2022-10-20 DIAGNOSIS — F332 Major depressive disorder, recurrent severe without psychotic features: Secondary | ICD-10-CM | POA: Diagnosis not present

## 2022-10-20 NOTE — Group Note (Signed)
[facility] LCSW Group Therapy Note   Group Date: 10/20/2022 Start Time: [id number] End Time: 1415   Type of Therapy/Topic:  Group Therapy:  Balance in Life  Participation Level:  Active   Description of Group:    This group will address the concept of balance and how it feels and looks when one is unbalanced. Patients will be encouraged to process areas in their lives that are out of balance, and identify reasons for remaining unbalanced. Facilitators will guide patients utilizing problem- solving interventions to address and correct the stressor making their life unbalanced. Understanding and applying boundaries will be explored and addressed for obtaining  and maintaining a balanced life. Patients will be encouraged to explore ways to assertively make their unbalanced needs known to significant others in their lives, using other group members and facilitator for support and feedback.  Therapeutic Goals: Patient will identify two or more emotions or situations they have that consume much of in their lives. Patient will identify signs/triggers that life has become out of balance:  Patient will identify two ways to set boundaries in order to achieve balance in their lives:  Patient will demonstrate ability to communicate their needs through discussion and/or role plays  Summary of Patient Progress:    Patient was present for the entirety of the group session. Patient was an active listener and participated in the topic of discussion, provided helpful advice to others, and added nuance to topic of conversation. She said that "she does most things well" but feels since her admission at the hospital and her current mental state; participating in self-care is a challenge.     Therapeutic Modalities:   Cognitive Behavioral Therapy Solution-Focused Therapy Assertiveness Training   [place] A [place], LCSWA

## 2022-10-20 NOTE — Group Note (Deleted)
Recreation Therapy Group Note   Group Topic:Problem Solving  Group Date: 10/20/2022 Start Time: 1400 End Time: 1455 Facilitators: Vilma Prader, LRT Location:  Dayroom       Affect/Mood: {RT BHH Affect/Mood:26271}   Participation Level: {RT BHH Participation Level:26267}   Participation Quality: {RT BHH Participation Quality:26268}   Behavior: {RT BHH Group Behavior:26269}   Speech/Thought Process: {RT BHH Speech/Thought:26276}   Insight: {RT BHH Insight:26272}   Judgement: {RT BHH Judgement:26278}   Modes of Intervention: {RT BHH Modes of Intervention:26277}   Patient Response to Interventions:  {RT BHH Patient Response to Intervention:26274}   Education Outcome:  {RT [place] Education Outcome:26279}   Clinical Observations/Individualized Feedback: *** was *** in their participation of session activities and group discussion. Pt identified ***   Plan: {RT BHH Tx [id number]   Vilma Prader, LRT,  10/20/2022 1:37 PM

## 2022-10-20 NOTE — BHH Group Notes (Signed)
[place] Group Notes:  (Nursing/MHT/Case Management/Adjunct)  Date:  10/20/2022  Time:  5:11 PM  Type of Therapy:  Psychoeducational Skills  Participation Level:  Active  Participation Quality:  Appropriate and Attentive  Affect:  Appropriate  Cognitive:  Alert and Appropriate  Insight:  Appropriate  Engagement in Group:  Engaged  Modes of Intervention:  Activity and Discussion  Summary of Progress/Problems: We discussed how coping skills helped with their thoughts. We colored shamrocks and added 3-4 coping skills that would help them.   Hosie Spangle 10/20/2022, 5:11 PM

## 2022-10-20 NOTE — Progress Notes (Signed)
[facility] MD Progress Note  10/20/2022 1:22 PM Caitlyn [name]  MRN:  [id number] Subjective: Caitlyn [name] is seen on rounds.  She received her second ECT yesterday and states that it was "a rough 1.  She continues to be negative but states that she did sleep pretty well.  She did mention that Zyprexa has been helpful.  She is a little bit guarded when it comes to switching her Prozac to Lexapro which has worked for her in the past.  Told her that she needs to stick with it.  She is agreeable to do ECT again tomorrow. Principal Problem: Major depressive disorder, recurrent severe without psychotic features ([name]) Diagnosis: Principal Problem:   Major depressive disorder, recurrent severe without psychotic features ([name])  Total Time spent with patient: 15 minutes  Past Psychiatric History: Saw a psychiatrist in [place] before moving to [place] and has been to Dr. Reece Levy twice for [name].  Past Medical History:  Past Medical History:  Diagnosis Date   Anxiety    Depression    Hypertension     Past Surgical History:  Procedure Laterality Date   EYE SURGERY     Family History:  Family History  Problem Relation Age of Onset   Breast cancer Neg Hx    Family Psychiatric  History: Unremarkable Social History:  Social History   Substance and Sexual Activity  Alcohol Use Never     Social History   Substance and Sexual Activity  Drug Use Never    Social History   Socioeconomic History   Marital status: Married    Spouse name: Not on file   Number of children: Not on file   Years of education: Not on file   Highest education level: Not on file  Occupational History   Not on file  Tobacco Use   Smoking status: Never   Smokeless tobacco: Never  Vaping Use   Vaping Use: Never used  Substance and Sexual Activity   Alcohol use: Never   Drug use: Never   Sexual activity: Not on file  Other Topics Concern   Not on file  Social History Narrative   Not on file   Social Determinants of Health    Financial Resource Strain: Not on file  Food Insecurity: No Food Insecurity (10/10/2022)   Hunger Vital Sign    Worried About Running Out of Food in the Last Year: Never true    Ran Out of Food in the Last Year: Never true  Transportation Needs: No Transportation Needs (10/10/2022)   PRAPARE - Hydrologist (Medical): No    Lack of Transportation (Non-Medical): No  Physical Activity: Not on file  Stress: Not on file  Social Connections: Not on file   Additional Social History:                         Sleep: Good  Appetite:  Good  Current Medications: Current Facility-Administered Medications  Medication Dose Route Frequency Provider Last Rate Last Admin   acetaminophen (TYLENOL) tablet 650 mg  650 mg Oral Q6H PRN Patrecia Pour, NP       alum & mag hydroxide-simeth (MAALOX/MYLANTA) 200-200-20 MG/5ML suspension 30 mL  30 mL Oral Q4H PRN Patrecia Pour, NP       buPROPion (WELLBUTRIN XL) 24 hr tablet 300 mg  300 mg Oral q morning Patrecia Pour, NP   300 mg at 10/20/22 0918   clonazePAM (KLONOPIN) tablet 0.5  mg  0.5 mg Oral QHS Clapacs, John T, MD   0.5 mg at 10/19/22 2207   escitalopram (LEXAPRO) tablet 20 mg  20 mg Oral QPC breakfast Parks Ranger, DO   20 mg at 10/20/22 [id number]   fentaNYL (SUBLIMAZE) injection 25-50 mcg  25-50 mcg Intravenous Q5 min PRN Darrin Nipper, MD       fentaNYL (SUBLIMAZE) injection 25-50 mcg  25-50 mcg Intravenous Q5 min PRN Dimas Millin, MD       lactated ringers infusion   Intravenous Continuous Darrin Nipper, MD       lisinopril (ZESTRIL) tablet 10 mg  10 mg Oral Daily Patrecia Pour, NP   10 mg at 10/20/22 [id number]   LORazepam (ATIVAN) tablet 1 mg  1 mg Oral Q4H PRN Parks Ranger, DO       magnesium hydroxide (MILK OF MAGNESIA) suspension 30 mL  30 mL Oral Daily PRN Patrecia Pour, NP       mirtazapine (REMERON) tablet 15 mg  15 mg Oral QHS Parks Ranger, DO   15 mg at 10/19/22  2207   multivitamin with minerals tablet 1 tablet  1 tablet Oral Daily Clapacs, Madie Reno, MD   1 tablet at 10/20/22 0918   OLANZapine (ZYPREXA) tablet 5 mg  5 mg Oral [id number] Parks Ranger, DO   5 mg at 10/20/22 [id number]   ondansetron (ZOFRAN-ODT) disintegrating tablet 4 mg  4 mg Oral Q8H PRN Parks Ranger, DO   4 mg at 10/17/22 1452   oxyCODONE (Oxy IR/ROXICODONE) immediate release tablet 5 mg  5 mg Oral Once PRN Darrin Nipper, MD       Or   oxyCODONE (ROXICODONE) 5 MG/5ML solution 5 mg  5 mg Oral Once PRN Darrin Nipper, MD       oxyCODONE (Oxy IR/ROXICODONE) immediate release tablet 5 mg  5 mg Oral Once PRN Dimas Millin, MD       Or   oxyCODONE (ROXICODONE) 5 MG/5ML solution 5 mg  5 mg Oral Once PRN Dimas Millin, MD       QUEtiapine (SEROQUEL) tablet 50 mg  50 mg Oral QHS Patrecia Pour, NP   50 mg at 10/19/22 2207    Lab Results: No results found for this or any previous visit (from the past 48 hour(s)).  Blood Alcohol level:  Lab Results  Component Value Date   ETH <10 10/09/2022   ETH <10 [id number]    Metabolic Disorder Labs: Lab Results  Component Value Date   HGBA1C 4.9 10/12/2022   MPG 93.93 10/12/2022   No results found for: "PROLACTIN" Lab Results  Component Value Date   CHOL 215 (H) 10/12/2022   TRIG 78 10/12/2022   HDL 87 10/12/2022   CHOLHDL 2.5 10/12/2022   VLDL 16 10/12/2022   LDLCALC 112 (H) 10/12/2022    Physical Findings: AIMS:  , ,  ,  ,    CIWA:    COWS:     Musculoskeletal: Strength & Muscle Tone: within normal limits Gait & Station: normal Patient leans: N/A  Psychiatric Specialty Exam:  Presentation  General Appearance:  Casual; Well Groomed  Eye Contact: Good  Speech: Clear and Coherent; Normal Rate  Speech Volume: Normal  Handedness: Right   Mood and Affect  Mood: Anxious; Depressed; Hopeless  Affect: Congruent; Depressed   Thought Process  Thought Processes: Coherent; Goal Directed;  Linear  Descriptions of Associations:Intact  Orientation:Full (Time, Place and Person)  Thought Content:Logical  History of Schizophrenia/Schizoaffective disorder:No data recorded Duration of Psychotic Symptoms:No data recorded Hallucinations:No data recorded Ideas of Reference:None  Suicidal Thoughts:No data recorded Homicidal Thoughts:No data recorded  Sensorium  Memory: Immediate Good; Recent Good; Remote Good  Judgment: Impaired  Insight: Fair   Community education officer  Concentration: Good  Attention Span: Good  Recall: Good  Fund of Knowledge: Good  Language: Good   Psychomotor Activity  Psychomotor Activity:No data recorded  Assets  Assets: Communication Skills; Desire for Improvement; Financial Resources/Insurance; Housing; Intimacy; Physical Health   Sleep  Sleep:No data recorded   Physical Exam: Physical Exam Vitals and nursing note reviewed.  Constitutional:      Appearance: Normal appearance. She is normal weight.  Neurological:     General: No focal deficit present.     Mental Status: She is alert and oriented to person, place, and time.  Psychiatric:        Attention and Perception: Attention and perception normal.        Mood and Affect: Mood is depressed. Affect is flat.        Speech: Speech normal.        Behavior: Behavior is withdrawn. Behavior is cooperative.        Thought Content: Thought content normal.        Cognition and Memory: Cognition and memory normal.        Judgment: Judgment normal.    Review of Systems  Constitutional: Negative.   HENT: Negative.    Eyes: Negative.   Respiratory: Negative.    Cardiovascular: Negative.   Gastrointestinal: Negative.   Genitourinary: Negative.   Musculoskeletal: Negative.   Skin: Negative.   Neurological: Negative.   Endo/Heme/Allergies: Negative.   Psychiatric/Behavioral:  Positive for depression.    Blood pressure (!) 148/81, pulse (!) 51, temperature 98.9 F (37.2  C), temperature source Oral, resp. rate 18, height '5\' 2"'$  (1.575 m), weight 49.4 kg, SpO2 98 %. Body mass index is 19.94 kg/m.   Treatment Plan Summary: Daily contact with patient to assess and evaluate symptoms and progress in treatment, Medication management, and Plan continue medications.  And ECT  Parks Ranger, DO 10/20/2022, 1:22 PM

## 2022-10-20 NOTE — Group Note (Signed)
Recreation Therapy Group Note   Group Topic:Problem Solving  Group Date: 10/20/2022 Start Time: 1415 End Time: 1500 Facilitators: Vilma Prader, LRT, CTRS Location:  Dayroom  Group Description: Life Boat. Patients were given the scenario that they are on a boat that is about to become shipwrecked, leaving them stranded on an [place]. They are asked to make a list of 15 different items that they want to take with them when they are stranded on the [place]. Patients are asked to rank their items from most important to least important, #1 being the most important and #15 being the least. Patients or LRT will read aloud the 15 different items to the group after. LRT facilitated post-activity processing to discuss how this activity can be used in daily life post discharge.  Affect/Mood: Appropriate and Happy   Participation Level: Active and Engaged   Participation Quality: Independent   Behavior: Appropriate and Calm   Speech/Thought Process: Coherent   Insight: Good   Judgement: Good   Modes of Intervention: Activity and Group work   Patient Response to Interventions:  Attentive, Engaged, Interested , and Receptive   Education Outcome:  Acknowledges education   Clinical Observations/Individualized Feedback: Caitlyn [name] was active in their participation of session activities and group discussion. Pt volunteered to be the Probation officer and spokesperson for her group. One of her top items to bring with her is a "lighter". Pt worked well with peers and was smiling and laughing throughout group.   Plan: Continue to engage patient in RT group sessions 2-3x/week.   Vilma Prader, LRT, CTRS 10/20/2022 3:30 PM

## 2022-10-20 NOTE — Progress Notes (Signed)
   10/20/22 0100  Psych Admission Type (Psych Patients Only)  Admission Status Voluntary  Psychosocial Assessment  Patient Complaints Anxiety;Depression  Eye Contact Fair  Facial Expression Anxious  Affect Depressed  Speech Logical/coherent  Interaction Guarded;Minimal  Motor Activity Slow  Appearance/Hygiene Unremarkable  Behavior Characteristics Cooperative  Mood Anxious  Thought Process  Content Preoccupation  Delusions None reported or observed  Perception WDL  Hallucination None reported or observed  Judgment WDL  Confusion None  Danger to Self  Current suicidal ideation? Denies  Agreement Not to Harm Self Yes  Description of Agreement verbal  Danger to Others  Danger to Others None reported or observed

## 2022-10-20 NOTE — Plan of Care (Signed)
Pt endorses anxiety/depression at this time. Pt denies SI/HI/AVH or pain at this time. Pt is calm and cooperative. Pt is medication compliant. Pt provided with support and encouragement. Pt monitored q15 minutes for safety per unit policy. Plan of care ongoing.   Pt voices anxiety about ECT procedure. MD and this writer have provided education, encouragement, and support for pt. Pt understands that she will be NPO starting tonight midnight. Pt understands that this writer will assist in preparing pt for procedure tomorrow and assist pt once she returns from procedure in dressing as well as hygiene. Pt also acknowledges that once this writer is notified of her estimated arrival time a hot meal will be ordered and ready for her.   Problem: Nutrition: Goal: Adequate nutrition will be maintained Outcome: Progressing   Problem: Coping: Goal: Level of anxiety will decrease Outcome: Not Progressing

## 2022-10-21 ENCOUNTER — Inpatient Hospital Stay: Payer: Medicare Other | Admitting: Certified Registered"

## 2022-10-21 ENCOUNTER — Other Ambulatory Visit: Payer: Self-pay | Admitting: Psychiatry

## 2022-10-21 ENCOUNTER — Encounter: Payer: Self-pay | Admitting: Psychiatry

## 2022-10-21 ENCOUNTER — Ambulatory Visit: Payer: Medicare Other

## 2022-10-21 DIAGNOSIS — F332 Major depressive disorder, recurrent severe without psychotic features: Secondary | ICD-10-CM | POA: Diagnosis not present

## 2022-10-21 MED ORDER — GLYCOPYRROLATE 0.2 MG/ML IJ SOLN
0.1000 mg | Freq: Once | INTRAMUSCULAR | Status: AC
  Administered 2022-10-21: 0.1 mg via INTRAVENOUS
  Filled 2022-10-21: qty 0.5

## 2022-10-21 MED ORDER — KETOROLAC TROMETHAMINE 30 MG/ML IJ SOLN
30.0000 mg | Freq: Once | INTRAMUSCULAR | Status: AC
  Administered 2022-10-21: 30 mg via INTRAVENOUS
  Filled 2022-10-21: qty 1

## 2022-10-21 MED ORDER — KETAMINE HCL 50 MG/5ML IJ SOSY
PREFILLED_SYRINGE | INTRAMUSCULAR | Status: AC
  Filled 2022-10-21: qty 5

## 2022-10-21 MED ORDER — LIDOCAINE HCL (CARDIAC) PF 100 MG/5ML IV SOSY
PREFILLED_SYRINGE | INTRAVENOUS | Status: DC | PRN
  Administered 2022-10-21: 20 mg via INTRAVENOUS

## 2022-10-21 MED ORDER — MIDAZOLAM HCL 2 MG/2ML IJ SOLN
INTRAMUSCULAR | Status: AC
  Filled 2022-10-21: qty 2

## 2022-10-21 MED ORDER — LABETALOL HCL 5 MG/ML IV SOLN
INTRAVENOUS | Status: DC | PRN
  Administered 2022-10-21: 10 mg via INTRAVENOUS

## 2022-10-21 MED ORDER — MIDAZOLAM HCL 2 MG/2ML IJ SOLN
INTRAMUSCULAR | Status: DC | PRN
  Administered 2022-10-21: 4 mg via INTRAVENOUS

## 2022-10-21 MED ORDER — MIDAZOLAM HCL 2 MG/2ML IJ SOLN: 4.0000 mg | Freq: Once | INTRAMUSCULAR | Status: DC

## 2022-10-21 MED ORDER — PROPOFOL 10 MG/ML IV BOLUS
INTRAVENOUS | Status: AC
  Filled 2022-10-21: qty 20

## 2022-10-21 MED ORDER — SODIUM CHLORIDE 0.9 % IV SOLN
500.0000 mL | Freq: Once | INTRAVENOUS | Status: AC
  Administered 2022-10-21: 500 mL via INTRAVENOUS

## 2022-10-21 MED ORDER — GLYCOPYRROLATE 0.2 MG/ML IJ SOLN
INTRAMUSCULAR | Status: AC
  Filled 2022-10-21: qty 1

## 2022-10-21 MED ORDER — KETAMINE HCL 10 MG/ML IJ SOLN
INTRAMUSCULAR | Status: DC | PRN
  Administered 2022-10-21: 40 mg via INTRAVENOUS

## 2022-10-21 MED ORDER — KETOROLAC TROMETHAMINE 30 MG/ML IJ SOLN
INTRAMUSCULAR | Status: AC
  Filled 2022-10-21: qty 1

## 2022-10-21 MED ORDER — SUCCINYLCHOLINE CHLORIDE 200 MG/10ML IV SOSY
PREFILLED_SYRINGE | INTRAVENOUS | Status: DC | PRN
  Administered 2022-10-21: 60 mg via INTRAVENOUS

## 2022-10-21 MED ORDER — PROPOFOL 10 MG/ML IV BOLUS
INTRAVENOUS | Status: DC | PRN
  Administered 2022-10-21: 30 ug via INTRAVENOUS

## 2022-10-21 NOTE — Progress Notes (Signed)
Report received from PACU and patient returned from ECT at 1345.

## 2022-10-21 NOTE — Anesthesia Procedure Notes (Signed)
Procedure Name: General with mask airway Date/Time: 10/21/2022 12:50 PM  Performed by: Lily Peer, Adelynne Joerger, CRNAPre-anesthesia Checklist: Suction available, Emergency Drugs available, Patient identified, Patient being monitored and Timeout performed Patient Re-evaluated:Patient Re-evaluated prior to induction Oxygen Delivery Method: Circle system utilized Preoxygenation: Pre-oxygenation with 100% oxygen Induction Type: IV induction Ventilation: Mask ventilation throughout procedure

## 2022-10-21 NOTE — Transfer of Care (Signed)
Immediate Anesthesia Transfer of Care Note  Patient: Caitlyn [name]  Procedure(s) Performed: ECT TX  Patient Location: PACU  Anesthesia Type:General  Level of Consciousness: drowsy  Airway & Oxygen Therapy: Patient Spontanous Breathing and Patient connected to face mask oxygen  Post-op Assessment: Report given to RN and Post -op Vital signs reviewed and stable  Post vital signs: Reviewed and stable  Last Vitals:  Vitals Value Taken Time  BP 164/89 10/21/22 1301  Temp    Pulse 42 10/21/22 1304  Resp 17 10/21/22 1304  SpO2 99 % 10/21/22 1304  Vitals shown include unvalidated device data.  Last Pain:  Vitals:   10/21/22 1149  TempSrc:   PainSc: 0-No pain         Complications: No notable events documented.

## 2022-10-21 NOTE — Procedures (Signed)
ECT SERVICES Physician's Interval Evaluation & Treatment Note  Patient Identification: Caitlyn [name] MRN:  [id number] Date of Evaluation:  10/21/2022 TX #: 3  MADRS:   MMSE:   P.E. Findings:  Unremarkable really no change from previously.  Underweight but stable.  Psychiatric Interval Note:  Still depressed but appears to be somewhat less anxious than previously.  Some signs to suggest some short-term memory problems  Subjective:  Patient is a 77 y.o. female seen for evaluation for Electroconvulsive Therapy. Still just complains mostly of nervousness and worry  Treatment Summary:   '[x]'$   Right Unilateral             '[]'$  Bilateral   % Energy : 0.3 ms 100%   Impedance: 1850 ohms  Seizure Energy Index: 12,010 V squared  Postictal Suppression Index: 80%  Seizure Concordance Index: 77%  Medications  Pre Shock: Lidocaine 40 mg Robinul 0.1 mg Toradol 30 mg propofol 30 mg ketamine 40 mg succinylcholine 60 mg  Post Shock: Versed 4 mg  Seizure Duration: 13 seconds EMG 46 seconds EEG   Comments: Good quality seizure well-tolerated as far as we can tell at this point.  We will follow-up over the weekend and she is on the schedule for Monday.  Lungs:  '[x]'$   Clear to auscultation               '[]'$  Other:   Heart:    '[x]'$   Regular rhythm             '[]'$  irregular rhythm    '[x]'$   Previous H&P reviewed, patient examined and there are NO CHANGES                 '[]'$   Previous H&P reviewed, patient examined and there are changes noted.   Alethia Berthold, MD 3/1/20242:06 PM

## 2022-10-21 NOTE — Anesthesia Preprocedure Evaluation (Signed)
Anesthesia Evaluation  Patient identified by MRN, date of birth, ID band Patient awake    Reviewed: Allergy & Precautions, NPO status , Patient's Chart, lab work & pertinent test results  History of Anesthesia Complications Negative for: history of anesthetic complications  Airway Mallampati: IV   Neck ROM: Full    Dental no notable dental hx.    Pulmonary neg pulmonary ROS   Pulmonary exam normal breath sounds clear to auscultation       Cardiovascular hypertension, Normal cardiovascular exam Rhythm:Regular Rate:Normal  ECG 10/10/22: Sinus rhythm, PVCs   Neuro/Psych  PSYCHIATRIC DISORDERS Anxiety Depression    negative neurological ROS     GI/Hepatic negative GI ROS,,,  Endo/Other  negative endocrine ROS    Renal/GU negative Renal ROS     Musculoskeletal   Abdominal   Peds  Hematology negative hematology ROS (+)   Anesthesia Other Findings   Reproductive/Obstetrics                              Anesthesia Physical Anesthesia Plan  ASA: 2  Anesthesia Plan: General   Post-op Pain Management:    Induction: Intravenous  PONV Risk Score and Plan: 3 and TIVA and Treatment may vary due to age or medical condition  Airway Management Planned: Mask  Additional Equipment:   Intra-op Plan:   Post-operative Plan:   Informed Consent: I have reviewed the patients History and Physical, chart, labs and discussed the procedure including the risks, benefits and alternatives for the proposed anesthesia with the patient or authorized representative who has indicated his/her understanding and acceptance.       Plan Discussed with: CRNA  Anesthesia Plan Comments: (Serial consent signed.  LMA/GETA backup discussed.  Patient consented for risks of anesthesia including but not limited to:  - adverse reactions to medications - damage to eyes, teeth, lips or other oral mucosa - nerve damage due  to positioning  - sore throat or hoarseness - damage to heart, brain, nerves, lungs, other parts of body or loss of life  Informed patient about role of CRNA in peri- and intra-operative care.  Patient voiced understanding.)        Anesthesia Quick Evaluation

## 2022-10-21 NOTE — Progress Notes (Signed)
   10/21/22 2020  Psych Admission Type (Psych Patients Only)  Admission Status Voluntary  Psychosocial Assessment  Patient Complaints Anxiety;Depression  Eye Contact Fair  Facial Expression Anxious  Speech Logical/coherent;Soft  Interaction Minimal  Motor Activity Slow  Appearance/Hygiene Unremarkable  Behavior Characteristics Cooperative;Anxious  Mood Anxious;Depressed;Preoccupied  Thought Process  Coherency WDL  Content WDL  Delusions None reported or observed  Perception WDL  Hallucination None reported or observed  Judgment WDL  Confusion None  Danger to Self  Current suicidal ideation? Denies  Danger to Others  Danger to Others None reported or observed   Progress note   D: Pt seen in her room. Pt denies SI, HI, AVH. Pt rates pain  0/10. Pt rates anxiety  5/10 and depression 6 /10. Pt had third ECT treatment today. "I felt cold afterward and all over the place." Pt is ruminating on the treatments and if she wants to do it again. Pt denies feeling any different. Pt told that she would experience certain symptoms after the treatments but that they are short-lived. Pt's son visited but then she went to her room for the evening. No other concerns noted at this time.  A: Pt provided support and encouragement. Pt given scheduled medication as prescribed. PRNs as appropriate. Q15 min checks for safety.   R: Pt safe on the unit. Will continue to monitor.

## 2022-10-21 NOTE — Group Note (Signed)
LCSW Group Therapy Note  Group Date: 10/21/2022 Start Time: 1510 End Time: 1545   Type of Therapy and Topic:  Group Therapy - How To Cope with Nervousness about Discharge   Participation Level:  Did Not Attend   Description of Group This process group involved identification of patients' feelings about discharge. Some of them are scheduled to be discharged soon, while others are new admissions, but each of them was asked to share thoughts and feelings surrounding discharge from the hospital. One common theme was that they are excited at the prospect of going home, while another was that many of them are apprehensive about sharing why they were hospitalized. Patients were given the opportunity to discuss these feelings with their peers in preparation for discharge.  Therapeutic Goals  Patient will identify their overall feelings about pending discharge. Patient will think about how they might proactively address issues that they believe will once again arise once they get home (i.e. with parents). Patients will participate in discussion about having hope for change.   Summary of Patient Progress:   X   Therapeutic Modalities Cognitive Behavioral Therapy   Kenly Henckel A [place], LCSWA 10/21/2022  4:04 PM

## 2022-10-21 NOTE — H&P (Signed)
Caitlyn [name] is an 77 y.o. female.   Chief Complaint: no new complaint. A little less anxious HPI: recurrent severe depression  Past Medical History:  Diagnosis Date   Anxiety    Depression    Hypertension     Past Surgical History:  Procedure Laterality Date   EYE SURGERY      Family History  Problem Relation Age of Onset   Breast cancer Neg Hx    Social History:  reports that she has never smoked. She has never used smokeless tobacco. She reports that she does not drink alcohol and does not use drugs.  Allergies:  Allergies  Allergen Reactions   Penicillins     Medications Prior to Admission  Medication Sig Dispense Refill   buPROPion (WELLBUTRIN XL) 300 MG 24 hr tablet Take 300 mg by mouth every morning.     cycloSPORINE (RESTASIS) 0.05 % ophthalmic emulsion Place 1 drop into both eyes daily.     FLUoxetine (PROZAC) 20 MG capsule Take 20-40 mg by mouth See admin instructions. Takes 20 mg and 40 mg on alternate days.     lisinopril (ZESTRIL) 10 MG tablet Take 10 mg by mouth daily.     QUEtiapine (SEROQUEL) 50 MG tablet Take 50 mg by mouth at bedtime.      No results found for this or any previous visit (from the past 48 hour(s)). No results found.  Review of Systems  Constitutional: Negative.   HENT: Negative.    Eyes: Negative.   Respiratory: Negative.    Cardiovascular: Negative.   Gastrointestinal: Negative.   Musculoskeletal: Negative.   Skin: Negative.   Neurological: Negative.   Psychiatric/Behavioral:  The patient is nervous/anxious.     Blood pressure (!) 169/88, pulse (!) 53, temperature 98.5 F (36.9 C), temperature source Oral, resp. rate 18, height '5\' 2"'$  (1.575 m), weight 49.4 kg, SpO2 98 %. Physical Exam Vitals reviewed.  Constitutional:      Appearance: She is well-developed.  HENT:     Head: Normocephalic and atraumatic.  Eyes:     Conjunctiva/sclera: Conjunctivae normal.     Pupils: Pupils are equal, round, and reactive to light.   Cardiovascular:     Heart sounds: Normal heart sounds.  Pulmonary:     Effort: Pulmonary effort is normal.  Abdominal:     Palpations: Abdomen is soft.  Musculoskeletal:        General: Normal range of motion.     Cervical back: Normal range of motion.  Skin:    General: Skin is warm and dry.  Neurological:     General: No focal deficit present.     Mental Status: She is alert.  Psychiatric:        Attention and Perception: Attention normal.        Mood and Affect: Affect is blunt.        Speech: Speech is delayed.        Behavior: Behavior is slowed.        Thought Content: Thought content normal.        Cognition and Memory: Memory is impaired.      Assessment/Plan Treatment today and continue into next week  Alethia Berthold, MD 10/21/2022, 12:22 PM

## 2022-10-21 NOTE — Group Note (Signed)
Date:  10/21/2022 Time:  4:47 PM  Group Topic/Focus:  Dimensions of Wellness:   The focus of this group is to introduce the topic of wellness and discuss the role each dimension of wellness plays in total health.    Participation Level:  Did Not Attend  Participation Quality:    Affect:    Cognitive:    Insight:   Engagement in Group:    Modes of Intervention:    Additional Comments:  Did not attend   Ladona Mow 10/21/2022, 4:47 PM

## 2022-10-21 NOTE — Group Note (Signed)
Recreation Therapy Group Note   Group Topic:Relaxation  Group Date: 10/21/2022 Start Time: 1400 End Time: 1450 Facilitators: Vilma Prader, LRT, CTRS Location:  Dayroom  Group Description: Mindfulness Body Scan. LRT asked pt their current level of stress and anxiety. LRT educated on the benefits of mindfulness and how it can apply to everyday life post-discharge. LRT and pt's followed along to an audio script of a "mindfulness body scan" video. LRT asked pt their level of stress and anxiety once the prompt was finished. LRT gave pts a education sheet on the benefits on mindfulness.   Affect/Mood: N/A   Participation Level: Did not attend    Clinical Observations/Individualized Feedback: Aleeha did not attend group due to coming back from ECT. Pt was provided an educational handout on mindfulness.   Plan: Continue to engage patient in RT group sessions 2-3x/week.   Vilma Prader, LRT, CTRS 10/21/2022 3:09 PM

## 2022-10-21 NOTE — Progress Notes (Signed)
[facility] MD Progress Note  10/21/2022 12:09 PM Caitlyn [name]  MRN:  [id number] Subjective: Caitlyn [name] is seen on rounds.  She states that she slept well last night.  She is still very anxious.  She has had 2 ECT treatments which she states are not helping.  She continues to be negative in her outlook.  I tried to reassure her the effectiveness of ECT.  I did make some medication changes which also causes her distress.  She has been compliant with her medicine.  She denies any side effects.  She denies any suicidal ideation.  She is going to receive her third ECT treatment today. Principal Problem: Major depressive disorder, recurrent severe without psychotic features ([place]) Diagnosis: Principal Problem:   Major depressive disorder, recurrent severe without psychotic features ([place])  Total Time spent with patient: 15 minutes  Past Psychiatric History: She saw a psychiatrist in [place] before moving to [place] and has been to Dr. Reece Levy twice for [name].  Past Medical History:  Past Medical History:  Diagnosis Date   Anxiety    Depression    Hypertension     Past Surgical History:  Procedure Laterality Date   EYE SURGERY     Family History:  Family History  Problem Relation Age of Onset   Breast cancer Neg Hx    Family Psychiatric  History: Unremarkable. Social History:  Social History   Substance and Sexual Activity  Alcohol Use Never     Social History   Substance and Sexual Activity  Drug Use Never    Social History   Socioeconomic History   Marital status: Married    Spouse name: Not on file   Number of children: Not on file   Years of education: Not on file   Highest education level: Not on file  Occupational History   Not on file  Tobacco Use   Smoking status: Never   Smokeless tobacco: Never  Vaping Use   Vaping Use: Never used  Substance and Sexual Activity   Alcohol use: Never   Drug use: Never   Sexual activity: Not on file  Other Topics Concern   Not on file   Social History Narrative   Not on file   Social Determinants of Health   Financial Resource Strain: Not on file  Food Insecurity: No Food Insecurity (10/10/2022)   Hunger Vital Sign    Worried About Running Out of Food in the Last Year: Never true    Ran Out of Food in the Last Year: Never true  Transportation Needs: No Transportation Needs (10/10/2022)   PRAPARE - Hydrologist (Medical): No    Lack of Transportation (Non-Medical): No  Physical Activity: Not on file  Stress: Not on file  Social Connections: Not on file   Additional Social History:                         Sleep: Good  Appetite:  Fair  Current Medications: Current Facility-Administered Medications  Medication Dose Route Frequency Provider Last Rate Last Admin   acetaminophen (TYLENOL) tablet 650 mg  650 mg Oral Q6H PRN Patrecia Pour, NP       alum & mag hydroxide-simeth (MAALOX/MYLANTA) 200-200-20 MG/5ML suspension 30 mL  30 mL Oral Q4H PRN Patrecia Pour, NP       buPROPion (WELLBUTRIN XL) 24 hr tablet 300 mg  300 mg Oral q morning Patrecia Pour, NP   300  mg at 10/21/22 0901   clonazePAM (KLONOPIN) tablet 0.5 mg  0.5 mg Oral QHS Clapacs, John T, MD   0.5 mg at 10/20/22 2144   escitalopram (LEXAPRO) tablet 20 mg  20 mg Oral QPC breakfast Parks Ranger, DO   20 mg at 10/21/22 0900   fentaNYL (SUBLIMAZE) injection 25-50 mcg  25-50 mcg Intravenous Q5 min PRN Darrin Nipper, MD       fentaNYL (SUBLIMAZE) injection 25-50 mcg  25-50 mcg Intravenous Q5 min PRN Dimas Millin, MD       glycopyrrolate (ROBINUL) 0.2 MG/ML injection            ketorolac (TORADOL) 30 MG/ML injection            lactated ringers infusion   Intravenous Continuous Darrin Nipper, MD       lisinopril (ZESTRIL) tablet 10 mg  10 mg Oral Daily Patrecia Pour, NP   10 mg at 10/21/22 0908   LORazepam (ATIVAN) tablet 1 mg  1 mg Oral Q4H PRN Parks Ranger, DO       magnesium hydroxide  (MILK OF MAGNESIA) suspension 30 mL  30 mL Oral Daily PRN Patrecia Pour, NP       midazolam (VERSED) injection 4 mg  4 mg Intravenous Once Clapacs, Madie Reno, MD       mirtazapine (REMERON) tablet 15 mg  15 mg Oral QHS Parks Ranger, DO   15 mg at 10/20/22 2144   multivitamin with minerals tablet 1 tablet  1 tablet Oral Daily Clapacs, Madie Reno, MD   1 tablet at 10/21/22 0900   OLANZapine (ZYPREXA) tablet 5 mg  5 mg Oral [id number] Parks Ranger, DO   5 mg at 10/21/22 0900   ondansetron (ZOFRAN-ODT) disintegrating tablet 4 mg  4 mg Oral Q8H PRN Parks Ranger, DO   4 mg at 10/17/22 1452   oxyCODONE (Oxy IR/ROXICODONE) immediate release tablet 5 mg  5 mg Oral Once PRN Darrin Nipper, MD       Or   oxyCODONE (ROXICODONE) 5 MG/5ML solution 5 mg  5 mg Oral Once PRN Darrin Nipper, MD       oxyCODONE (Oxy IR/ROXICODONE) immediate release tablet 5 mg  5 mg Oral Once PRN Dimas Millin, MD       Or   oxyCODONE (ROXICODONE) 5 MG/5ML solution 5 mg  5 mg Oral Once PRN Dimas Millin, MD       QUEtiapine (SEROQUEL) tablet 50 mg  50 mg Oral QHS Patrecia Pour, NP   50 mg at 10/20/22 2144    Lab Results: No results found for this or any previous visit (from the past 48 hour(s)).  Blood Alcohol level:  Lab Results  Component Value Date   ETH <10 10/09/2022   ETH <10 [id number]    Metabolic Disorder Labs: Lab Results  Component Value Date   HGBA1C 4.9 10/12/2022   MPG 93.93 10/12/2022   No results found for: "PROLACTIN" Lab Results  Component Value Date   CHOL 215 (H) 10/12/2022   TRIG 78 10/12/2022   HDL 87 10/12/2022   CHOLHDL 2.5 10/12/2022   VLDL 16 10/12/2022   LDLCALC 112 (H) 10/12/2022    Physical Findings: AIMS:  , ,  ,  ,    CIWA:    COWS:     Musculoskeletal: Strength & Muscle Tone: within normal limits Gait & Station: normal Patient leans: N/A  Psychiatric Specialty Exam:  Presentation  General Appearance:  Casual; Well Groomed  Eye  Contact: Good  Speech: Clear and Coherent; Normal Rate  Speech Volume: Normal  Handedness: Right   Mood and Affect  Mood: Anxious; Depressed; Hopeless  Affect: Congruent; Depressed   Thought Process  Thought Processes: Coherent; Goal Directed; Linear  Descriptions of Associations:Intact  Orientation:Full (Time, Place and Person)  Thought Content:Logical  History of Schizophrenia/Schizoaffective disorder:No data recorded Duration of Psychotic Symptoms:No data recorded Hallucinations:No data recorded Ideas of Reference:None  Suicidal Thoughts:No data recorded Homicidal Thoughts:No data recorded  Sensorium  Memory: Immediate Good; Recent Good; Remote Good  Judgment: Impaired  Insight: Fair   Community education officer  Concentration: Good  Attention Span: Good  Recall: Good  Fund of Knowledge: Good  Language: Good   Psychomotor Activity  Psychomotor Activity:No data recorded  Assets  Assets: Communication Skills; Desire for Improvement; Financial Resources/Insurance; Housing; Intimacy; Physical Health   Sleep  Sleep:No data recorded   Physical Exam: Physical Exam Vitals and nursing note reviewed.  Constitutional:      Appearance: Normal appearance. She is normal weight.  Neurological:     General: No focal deficit present.     Mental Status: She is alert and oriented to person, place, and time.  Psychiatric:        Attention and Perception: Attention and perception normal.        Mood and Affect: Mood is anxious and depressed. Affect is labile.        Speech: Speech normal.        Behavior: Behavior normal. Behavior is cooperative.        Thought Content: Thought content is paranoid.        Cognition and Memory: Cognition and memory normal.        Judgment: Judgment is inappropriate.    Review of Systems  Constitutional: Negative.   HENT: Negative.    Eyes: Negative.   Respiratory: Negative.    Cardiovascular: Negative.    Gastrointestinal: Negative.   Genitourinary: Negative.   Musculoskeletal: Negative.   Skin: Negative.   Neurological: Negative.   Endo/Heme/Allergies: Negative.   Psychiatric/Behavioral: Negative.     Blood pressure (!) 169/88, pulse (!) 53, temperature 98.5 F (36.9 C), temperature source Oral, resp. rate 18, height '5\' 2"'$  (1.575 m), weight 49.4 kg, SpO2 98 %. Body mass index is 19.94 kg/m.   Treatment Plan Summary: Daily contact with patient to assess and evaluate symptoms and progress in treatment, Medication management, and Plan continue current medications.  Continue ECT.  Parks Ranger, DO 10/21/2022, 12:09 PM

## 2022-10-21 NOTE — Plan of Care (Signed)
Pt endorses anxiety/depression at this time. Pt denies SI/HI/AVH or pain at this time. Pt is calm and cooperative. Pt is medication compliant. Pt provided with support and encouragement. Pt monitored q15 minutes for safety per unit policy. Plan of care ongoing.   Pt tolerated ECT well today.   Problem: Activity: Goal: Risk for activity intolerance will decrease Outcome: Progressing   Problem: Coping: Goal: Level of anxiety will decrease Outcome: Not Progressing

## 2022-10-22 DIAGNOSIS — F332 Major depressive disorder, recurrent severe without psychotic features: Secondary | ICD-10-CM | POA: Diagnosis not present

## 2022-10-22 NOTE — Plan of Care (Signed)
Pt endorses anxiety/depression at this time. Pt denies SI/HI/AVH or pain at this time. Pt is calm and cooperative. Pt is medication compliant. Pt provided with support and encouragement. Pt monitored q15 minutes for safety per unit policy. Plan of care ongoing.   Pt attended all groups and went outside.  Problem: Activity: Goal: Risk for activity intolerance will decrease Outcome: Progressing   Problem: Coping: Goal: Level of anxiety will decrease Outcome: Not Progressing

## 2022-10-22 NOTE — BH IP Treatment Plan (Signed)
Interdisciplinary Treatment and Diagnostic Plan Update  10/22/2022 Time of Session: 11:15am Caitlyn [name] MRN: [id number]  Principal Diagnosis: Major depressive disorder, recurrent severe without psychotic features ([place])  Secondary Diagnoses: Principal Problem:   Major depressive disorder, recurrent severe without psychotic features ([place])   Current Medications:  Current Facility-Administered Medications  Medication Dose Route Frequency Provider Last Rate Last Admin   acetaminophen (TYLENOL) tablet 650 mg  650 mg Oral Q6H PRN Patrecia Pour, NP       alum & mag hydroxide-simeth (MAALOX/MYLANTA) 200-200-20 MG/5ML suspension 30 mL  30 mL Oral Q4H PRN Patrecia Pour, NP       buPROPion (WELLBUTRIN XL) 24 hr tablet 300 mg  300 mg Oral q morning Patrecia Pour, NP   300 mg at 10/22/22 0901   clonazePAM (KLONOPIN) tablet 0.5 mg  0.5 mg Oral QHS Clapacs, John T, MD   0.5 mg at 10/21/22 2103   escitalopram (LEXAPRO) tablet 20 mg  20 mg Oral QPC breakfast Parks Ranger, DO   20 mg at 10/22/22 0901   fentaNYL (SUBLIMAZE) injection 25-50 mcg  25-50 mcg Intravenous Q5 min PRN Darrin Nipper, MD       fentaNYL (SUBLIMAZE) injection 25-50 mcg  25-50 mcg Intravenous Q5 min PRN Dimas Millin, MD       lactated ringers infusion   Intravenous Continuous Darrin Nipper, MD   New Bag at 10/21/22 1247   lisinopril (ZESTRIL) tablet 10 mg  10 mg Oral Daily Patrecia Pour, NP   10 mg at 10/22/22 0901   LORazepam (ATIVAN) tablet 1 mg  1 mg Oral Q4H PRN Parks Ranger, DO       magnesium hydroxide (MILK OF MAGNESIA) suspension 30 mL  30 mL Oral Daily PRN Patrecia Pour, NP       midazolam (VERSED) injection 4 mg  4 mg Intravenous Once Clapacs, Madie Reno, MD       mirtazapine (REMERON) tablet 15 mg  15 mg Oral QHS Parks Ranger, DO   15 mg at 10/21/22 2103   multivitamin with minerals tablet 1 tablet  1 tablet Oral Daily Clapacs, Madie Reno, MD   1 tablet at 10/22/22 0901   OLANZapine  (ZYPREXA) tablet 5 mg  5 mg Oral BH-q8a4p Parks Ranger, DO   5 mg at 10/22/22 0859   ondansetron (ZOFRAN-ODT) disintegrating tablet 4 mg  4 mg Oral Q8H PRN Parks Ranger, DO   4 mg at 10/17/22 1452   oxyCODONE (Oxy IR/ROXICODONE) immediate release tablet 5 mg  5 mg Oral Once PRN Darrin Nipper, MD       Or   oxyCODONE (ROXICODONE) 5 MG/5ML solution 5 mg  5 mg Oral Once PRN Darrin Nipper, MD       oxyCODONE (Oxy IR/ROXICODONE) immediate release tablet 5 mg  5 mg Oral Once PRN Dimas Millin, MD       Or   oxyCODONE (ROXICODONE) 5 MG/5ML solution 5 mg  5 mg Oral Once PRN Dimas Millin, MD       QUEtiapine (SEROQUEL) tablet 50 mg  50 mg Oral QHS Patrecia Pour, NP   50 mg at 10/21/22 2103   PTA Medications: Medications Prior to Admission  Medication Sig Dispense Refill Last Dose   buPROPion (WELLBUTRIN XL) 300 MG 24 hr tablet Take 300 mg by mouth every morning.   10/16/2022   cycloSPORINE (RESTASIS) 0.05 % ophthalmic emulsion Place 1 drop into both eyes daily.   10/16/2022  FLUoxetine (PROZAC) 20 MG capsule Take 20-40 mg by mouth See admin instructions. Takes 20 mg and 40 mg on alternate days.   10/16/2022   lisinopril (ZESTRIL) 10 MG tablet Take 10 mg by mouth daily.   10/16/2022   QUEtiapine (SEROQUEL) 50 MG tablet Take 50 mg by mouth at bedtime.   10/16/2022    Patient Stressors: Other: just moved here from [facility]    Patient Strengths: Capable of independent living  Communication skills  General fund of knowledge  Supportive family/friends   Treatment Modalities: Medication Management, Group therapy, Case management,  1 to 1 session with clinician, Psychoeducation, Recreational therapy.   Physician Treatment Plan for Primary Diagnosis: Major depressive disorder, recurrent severe without psychotic features ([place]) Long Term Goal(s): Improvement in symptoms so as ready for discharge   Short Term Goals: Compliance with prescribed medications will improve Ability  to verbalize feelings will improve Ability to disclose and discuss suicidal ideas Ability to demonstrate self-control will improve Ability to identify and develop effective coping behaviors will improve  Medication Management: Evaluate patient's response, side effects, and tolerance of medication regimen.  Therapeutic Interventions: 1 to 1 sessions, Unit Group sessions and Medication administration.  Evaluation of Outcomes: Progressing  Physician Treatment Plan for Secondary Diagnosis: Principal Problem:   Major depressive disorder, recurrent severe without psychotic features ([place])  Long Term Goal(s): Improvement in symptoms so as ready for discharge   Short Term Goals: Compliance with prescribed medications will improve Ability to verbalize feelings will improve Ability to disclose and discuss suicidal ideas Ability to demonstrate self-control will improve Ability to identify and develop effective coping behaviors will improve     Medication Management: Evaluate patient's response, side effects, and tolerance of medication regimen.  Therapeutic Interventions: 1 to 1 sessions, Unit Group sessions and Medication administration.  Evaluation of Outcomes: Progressing   RN Treatment Plan for Primary Diagnosis: Major depressive disorder, recurrent severe without psychotic features ([place]) Long Term Goal(s): Knowledge of disease and therapeutic regimen to maintain health will improve  Short Term Goals: Ability to remain free from injury will improve, Ability to verbalize frustration and anger appropriately will improve, Ability to demonstrate self-control, Ability to participate in decision making will improve, Ability to identify and develop effective coping behaviors will improve, and Compliance with prescribed medications will improve  Medication Management: RN will administer medications as ordered by provider, will assess and evaluate patient's response and provide education to patient  for prescribed medication. RN will report any adverse and/or side effects to prescribing provider.  Therapeutic Interventions: 1 on 1 counseling sessions, Psychoeducation, Medication administration, Evaluate responses to treatment, Monitor vital signs and CBGs as ordered, Perform/monitor CIWA, COWS, AIMS and Fall Risk screenings as ordered, Perform wound care treatments as ordered.  Evaluation of Outcomes: Progressing   LCSW Treatment Plan for Primary Diagnosis: Major depressive disorder, recurrent severe without psychotic features ([place]) Long Term Goal(s): Safe transition to appropriate next level of care at discharge, Engage patient in therapeutic group addressing interpersonal concerns.  Short Term Goals: Engage patient in aftercare planning with referrals and resources, Increase social support, Increase ability to appropriately verbalize feelings, Increase emotional regulation, and Increase skills for wellness and recovery  Therapeutic Interventions: Assess for all discharge needs, 1 to 1 time with Social worker, Explore available resources and support systems, Assess for adequacy in community support network, Educate family and significant other(s) on suicide prevention, Complete Psychosocial Assessment, Interpersonal group therapy.  Evaluation of Outcomes: Progressing   Progress in Treatment: Attending groups:  Yes. Participating in groups: Yes. Taking medication as prescribed: Yes. Toleration medication: Yes. Family/Significant other contact made: Yes, individual(s) contacted:  SPE completed with pt's daughter in law, Brayton Broxson Patient understands diagnosis: Yes. Discussing patient identified problems/goals with staff: Yes. Medical problems stabilized or resolved: Yes. Denies suicidal/homicidal ideation: Yes. Issues/concerns per patient self-inventory: No. Other: None   New problem(s) identified: No, Describe:  None Update 10/22/2022: No changes at this time.    New Short  Term/Long Term Goal(s): Patient to work towards medication management for mood stabilization; elimination of SI thoughts; development of comprehensive mental wellness plan. Update 10/22/2022: No changes at this time.        Patient Goals:  "find motivation" Update 10/22/2022: No changes at this time.      Discharge Plan or Barriers: CSW will assist pt with development of appropriate discharge/aftercare plan.  Update 10/22/2022: Patient to continue receiving ECT. Will continue to follow for discharge planning.      Reason for Continuation of Hospitalization: Anxiety Depression Suicidal ideation   Estimated Length of Stay: TBD Update 10/22/2022: No changes at this time.     Last 3 [place] Suicide Severity Risk Score: Flowsheet Row Admission (Current) from 10/10/2022 in [facility] ED from 10/09/2022 in [facility] Emergency Department at [facility] ED from 01/07/2022 in [facility] Emergency Department at [place] Error: Q3, 4, or 5 should not be populated when Q2 is No High Risk No Risk       Last PHQ 2/9 Scores:     No data to display          Scribe for Treatment Team: Vassie Moselle, LCSW 10/22/2022 12:25 PM

## 2022-10-22 NOTE — Progress Notes (Signed)
[facility] MD Progress Note  10/22/2022 3:29 PM Caitlyn [name]  MRN:  [id number] Subjective: Follow-up 77 year old woman with severe recurrent depression.  Patient has had 3 electroconvulsive therapy treatments now all well-tolerated.  Patient was up earlier in the day interacting well with peers but this afternoon when I went to see her said that she was still feeling very nervous.  She wishes that she could go home but she admits that her depression does not really feel any better.  Continues to have occasional passive suicidal thoughts with no intent or plan. Principal Problem: Major depressive disorder, recurrent severe without psychotic features ([place]) Diagnosis: Principal Problem:   Major depressive disorder, recurrent severe without psychotic features ([place])  Total Time spent with patient: 30 minutes  Past Psychiatric History: Past history of recurrent depression and anxiety which appears to have gotten worse  Past Medical History:  Past Medical History:  Diagnosis Date   Anxiety    Depression    Hypertension     Past Surgical History:  Procedure Laterality Date   EYE SURGERY     Family History:  Family History  Problem Relation Age of Onset   Breast cancer Neg Hx    Family Psychiatric  History: See previous Social History:  Social History   Substance and Sexual Activity  Alcohol Use Never     Social History   Substance and Sexual Activity  Drug Use Never    Social History   Socioeconomic History   Marital status: Married    Spouse name: Not on file   Number of children: Not on file   Years of education: Not on file   Highest education level: Not on file  Occupational History   Not on file  Tobacco Use   Smoking status: Never   Smokeless tobacco: Never  Vaping Use   Vaping Use: Never used  Substance and Sexual Activity   Alcohol use: Never   Drug use: Never   Sexual activity: Not on file  Other Topics Concern   Not on file  Social History Narrative   Not on  file   Social Determinants of Health   Financial Resource Strain: Not on file  Food Insecurity: No Food Insecurity (10/10/2022)   Hunger Vital Sign    Worried About Running Out of Food in the Last Year: Never true    Ran Out of Food in the Last Year: Never true  Transportation Needs: No Transportation Needs (10/10/2022)   PRAPARE - Hydrologist (Medical): No    Lack of Transportation (Non-Medical): No  Physical Activity: Not on file  Stress: Not on file  Social Connections: Not on file   Additional Social History:                         Sleep: Fair  Appetite:  Fair  Current Medications: Current Facility-Administered Medications  Medication Dose Route Frequency Provider Last Rate Last Admin   acetaminophen (TYLENOL) tablet 650 mg  650 mg Oral Q6H PRN Patrecia Pour, NP       alum & mag hydroxide-simeth (MAALOX/MYLANTA) 200-200-20 MG/5ML suspension 30 mL  30 mL Oral Q4H PRN Patrecia Pour, NP       buPROPion (WELLBUTRIN XL) 24 hr tablet 300 mg  300 mg Oral q morning Patrecia Pour, NP   300 mg at 10/22/22 0901   clonazePAM (KLONOPIN) tablet 0.5 mg  0.5 mg Oral QHS Melis Trochez, Madie Reno, MD  0.5 mg at 10/21/22 2103   escitalopram (LEXAPRO) tablet 20 mg  20 mg Oral QPC breakfast Parks Ranger, DO   20 mg at 10/22/22 0901   fentaNYL (SUBLIMAZE) injection 25-50 mcg  25-50 mcg Intravenous Q5 min PRN Darrin Nipper, MD       fentaNYL (SUBLIMAZE) injection 25-50 mcg  25-50 mcg Intravenous Q5 min PRN Dimas Millin, MD       lactated ringers infusion   Intravenous Continuous Darrin Nipper, MD   New Bag at 10/21/22 1247   lisinopril (ZESTRIL) tablet 10 mg  10 mg Oral Daily Patrecia Pour, NP   10 mg at 10/22/22 0901   LORazepam (ATIVAN) tablet 1 mg  1 mg Oral Q4H PRN Parks Ranger, DO       magnesium hydroxide (MILK OF MAGNESIA) suspension 30 mL  30 mL Oral Daily PRN Patrecia Pour, NP       midazolam (VERSED) injection 4 mg  4  mg Intravenous Once Jaedynn Bohlken, Madie Reno, MD       mirtazapine (REMERON) tablet 15 mg  15 mg Oral QHS Parks Ranger, DO   15 mg at 10/21/22 2103   multivitamin with minerals tablet 1 tablet  1 tablet Oral Daily Alvah Gilder, Madie Reno, MD   1 tablet at 10/22/22 0901   OLANZapine (ZYPREXA) tablet 5 mg  5 mg Oral BH-q8a4p Parks Ranger, DO   5 mg at 10/22/22 0859   ondansetron (ZOFRAN-ODT) disintegrating tablet 4 mg  4 mg Oral Q8H PRN Parks Ranger, DO   4 mg at 10/17/22 1452   oxyCODONE (Oxy IR/ROXICODONE) immediate release tablet 5 mg  5 mg Oral Once PRN Darrin Nipper, MD       Or   oxyCODONE (ROXICODONE) 5 MG/5ML solution 5 mg  5 mg Oral Once PRN Darrin Nipper, MD       oxyCODONE (Oxy IR/ROXICODONE) immediate release tablet 5 mg  5 mg Oral Once PRN Dimas Millin, MD       Or   oxyCODONE (ROXICODONE) 5 MG/5ML solution 5 mg  5 mg Oral Once PRN Dimas Millin, MD       QUEtiapine (SEROQUEL) tablet 50 mg  50 mg Oral QHS Patrecia Pour, NP   50 mg at 10/21/22 2103    Lab Results: No results found for this or any previous visit (from the past 48 hour(s)).  Blood Alcohol level:  Lab Results  Component Value Date   ETH <10 10/09/2022   ETH <10 [id number]    Metabolic Disorder Labs: Lab Results  Component Value Date   HGBA1C 4.9 10/12/2022   MPG 93.93 10/12/2022   No results found for: "PROLACTIN" Lab Results  Component Value Date   CHOL 215 (H) 10/12/2022   TRIG 78 10/12/2022   HDL 87 10/12/2022   CHOLHDL 2.5 10/12/2022   VLDL 16 10/12/2022   LDLCALC 112 (H) 10/12/2022    Physical Findings: AIMS:  , ,  ,  ,    CIWA:    COWS:     Musculoskeletal: Strength & Muscle Tone: within normal limits Gait & Station: normal Patient leans: N/A  Psychiatric Specialty Exam:  Presentation  General Appearance:  Casual; Well Groomed  Eye Contact: Good  Speech: Clear and Coherent; Normal Rate  Speech Volume: Normal  Handedness: Right   Mood and  Affect  Mood: Anxious; Depressed; Hopeless  Affect: Congruent; Depressed   Thought Process  Thought Processes: Coherent; Goal Directed; Linear  Descriptions of  Associations:Intact  Orientation:Full (Time, Place and Person)  Thought Content:Logical  History of Schizophrenia/Schizoaffective disorder:No data recorded Duration of Psychotic Symptoms:No data recorded Hallucinations:No data recorded Ideas of Reference:None  Suicidal Thoughts:No data recorded Homicidal Thoughts:No data recorded  Sensorium  Memory: Immediate Good; Recent Good; Remote Good  Judgment: Impaired  Insight: Fair   Community education officer  Concentration: Good  Attention Span: Good  Recall: Good  Fund of Knowledge: Good  Language: Good   Psychomotor Activity  Psychomotor Activity:No data recorded  Assets  Assets: Communication Skills; Desire for Improvement; Financial Resources/Insurance; Housing; Intimacy; Physical Health   Sleep  Sleep:No data recorded   Physical Exam: Physical Exam Vitals and nursing note reviewed.  Constitutional:      Appearance: Normal appearance.  HENT:     Head: Normocephalic and atraumatic.     Mouth/Throat:     Pharynx: Oropharynx is clear.  Eyes:     Pupils: Pupils are equal, round, and reactive to light.  Cardiovascular:     Rate and Rhythm: Normal rate and regular rhythm.  Pulmonary:     Effort: Pulmonary effort is normal.     Breath sounds: Normal breath sounds.  Abdominal:     General: Abdomen is flat.     Palpations: Abdomen is soft.  Musculoskeletal:        General: Normal range of motion.  Skin:    General: Skin is warm and dry.  Neurological:     General: No focal deficit present.     Mental Status: She is alert. Mental status is at baseline.  Psychiatric:        Attention and Perception: Attention normal.        Mood and Affect: Mood is anxious.        Speech: Speech is delayed.        Behavior: Behavior is slowed.         Thought Content: Thought content includes suicidal ideation. Thought content does not include suicidal plan.        Cognition and Memory: Memory is impaired.    Review of Systems  Constitutional: Negative.   HENT: Negative.    Eyes: Negative.   Respiratory: Negative.    Cardiovascular: Negative.   Gastrointestinal: Negative.   Musculoskeletal: Negative.   Skin: Negative.   Neurological: Negative.   Psychiatric/Behavioral:  Positive for depression and suicidal ideas. Negative for hallucinations and substance abuse. The patient is nervous/anxious.    Blood pressure 137/82, pulse (!) 58, temperature 98.7 F (37.1 C), temperature source Oral, resp. rate 18, height '5\' 2"'$  (1.575 m), weight 49.4 kg, SpO2 98 %. Body mass index is 19.94 kg/m.   Treatment Plan Summary: Plan psychoeducation and review of treatment plan.  Supportive therapy.  No change to medications for today.  Reminded her that ECT will next be done on Monday.  Alethia Berthold, MD 10/22/2022, 3:29 PM

## 2022-10-23 ENCOUNTER — Other Ambulatory Visit: Payer: Self-pay | Admitting: Psychiatry

## 2022-10-23 DIAGNOSIS — F332 Major depressive disorder, recurrent severe without psychotic features: Secondary | ICD-10-CM | POA: Diagnosis not present

## 2022-10-23 NOTE — Group Note (Signed)
Date:  10/23/2022 Time:  11:25 AM  Group Topic/Focus:  Conflict Resolution:   The focus of this group is to discuss the conflict resolution process and how it may be used upon discharge. Healthy Communication:   The focus of this group is to discuss communication, barriers to communication, as well as healthy ways to communicate with others.    Participation Level:  Active  Participation Quality:  Appropriate, Sharing, and Supportive  Affect:  Appropriate  Cognitive:  Appropriate  Insight: Appropriate and Good  Engagement in Group:  Engaged, Improving, and Supportive  Modes of Intervention:  Discussion  Additional Comments:    Caitlyn [name] 10/23/2022, 11:25 AM

## 2022-10-23 NOTE — BH Assessment (Signed)
1910 Received report on patient who was in her room in bed.  2150 Patient awaken for medication administration. She is alert and oriented x 4. Patient is currently denying SI/HI, A/V hallucination, and pain. She is reporting depression 5/10  and anxiety 4/10. She has remained in bed since the start of this shift. Will continue to monitor patient for safety.  2355 Patient  is medication compliant. She did not come to the day room this shift, refused her snack and has remained quietly in her bed. Will continue to monitor patient for safety.

## 2022-10-23 NOTE — Progress Notes (Signed)
D- Patient alert and oriented x 4. Patient presents with a pleasant mood and affect. Patient greeted Probation officer at start of the shift with a smile. Patient laughed a little during shift. Patient came out of her room without prompting by staff. Patient denies SI, HI, AVH, and pain.   A- Scheduled medications administered to patient, per MD orders. Support and encouragement provided.  Routine safety checks conducted every 15 minutes.  Patient informed to notify staff with problems or concerns.  R- No adverse drug reactions noted. Patient contracts for safety at this time. Patient compliant with medications and treatment plan. Patient receptive, calm, and cooperative. Patient interacts well with others on the unit. Patient participated in group sessions. Patient remains safe at this time.   10/23/22 0907  Charting Type  Charting Type Shift assessment  Safety Check Verification  Has the RN verified the 15 minute safety check completion? Yes  Neurological  Neuro (WDL) WDL  Orientation Level Oriented X4  Cognition Appropriate at baseline  Speech Clear  HEENT  HEENT (WDL) WDL  Respiratory  Respiratory (WDL) WDL  Cardiac  Cardiac (WDL) WDL  Vascular  Vascular (WDL) WDL  Integumentary  Integumentary (WDL) WDL  Braden Scale (Ages 8 and up)  Sensory Perceptions 4  Moisture 4  Activity 3  Mobility 4  Nutrition 3  Friction and Shear 3  Braden Scale Score 21  Musculoskeletal  Musculoskeletal (WDL) WDL  Assistive Device None  Gastrointestinal  Gastrointestinal (WDL) WDL  Last BM Date  10/22/22  GU Assessment  Genitourinary (WDL) WDL  Neurological  Level of Consciousness Alert

## 2022-10-23 NOTE — Progress Notes (Signed)
[facility] MD Progress Note  10/23/2022 1:34 PM Caitlyn [name]  MRN:  [id number] Subjective: Follow-up patient with depression receiving ECT.  Patient seen and chart reviewed.  Patient reports she feels about the same.  Affect remains anxious and dysphoric.  No active suicidal ideation but still passive and somewhat hopeless.  Blood pressure elevated today but this has not been a consistent finding. Principal Problem: Major depressive disorder, recurrent severe without psychotic features ([place]) Diagnosis: Principal Problem:   Major depressive disorder, recurrent severe without psychotic features ([place])  Total Time spent with patient: 30 minutes  Past Psychiatric History: Past history of recurrent severe depression  Past Medical History:  Past Medical History:  Diagnosis Date   Anxiety    Depression    Hypertension     Past Surgical History:  Procedure Laterality Date   EYE SURGERY     Family History:  Family History  Problem Relation Age of Onset   Breast cancer Neg Hx    Family Psychiatric  History: See previous Social History:  Social History   Substance and Sexual Activity  Alcohol Use Never     Social History   Substance and Sexual Activity  Drug Use Never    Social History   Socioeconomic History   Marital status: Married    Spouse name: Not on file   Number of children: Not on file   Years of education: Not on file   Highest education level: Not on file  Occupational History   Not on file  Tobacco Use   Smoking status: Never   Smokeless tobacco: Never  Vaping Use   Vaping Use: Never used  Substance and Sexual Activity   Alcohol use: Never   Drug use: Never   Sexual activity: Not on file  Other Topics Concern   Not on file  Social History Narrative   Not on file   Social Determinants of Health   Financial Resource Strain: Not on file  Food Insecurity: No Food Insecurity (10/10/2022)   Hunger Vital Sign    Worried About Running Out of Food in the Last Year:  Never true    Ran Out of Food in the Last Year: Never true  Transportation Needs: No Transportation Needs (10/10/2022)   PRAPARE - Hydrologist (Medical): No    Lack of Transportation (Non-Medical): No  Physical Activity: Not on file  Stress: Not on file  Social Connections: Not on file   Additional Social History:                         Sleep: Fair  Appetite:  Fair  Current Medications: Current Facility-Administered Medications  Medication Dose Route Frequency Provider Last Rate Last Admin   acetaminophen (TYLENOL) tablet 650 mg  650 mg Oral Q6H PRN Patrecia Pour, NP       alum & mag hydroxide-simeth (MAALOX/MYLANTA) 200-200-20 MG/5ML suspension 30 mL  30 mL Oral Q4H PRN Patrecia Pour, NP       buPROPion (WELLBUTRIN XL) 24 hr tablet 300 mg  300 mg Oral q morning Patrecia Pour, NP   300 mg at 10/23/22 [id number]   clonazePAM (KLONOPIN) tablet 0.5 mg  0.5 mg Oral QHS Aracelli Woloszyn T, MD   0.5 mg at 10/22/22 2126   escitalopram (LEXAPRO) tablet 20 mg  20 mg Oral QPC breakfast Parks Ranger, DO   20 mg at 10/23/22 0847   fentaNYL (SUBLIMAZE) injection 25-50  mcg  25-50 mcg Intravenous Q5 min PRN Darrin Nipper, MD       fentaNYL (SUBLIMAZE) injection 25-50 mcg  25-50 mcg Intravenous Q5 min PRN Dimas Millin, MD       lactated ringers infusion   Intravenous Continuous Darrin Nipper, MD   New Bag at 10/21/22 1247   lisinopril (ZESTRIL) tablet 10 mg  10 mg Oral Daily Patrecia Pour, NP   10 mg at 10/23/22 [id number]   LORazepam (ATIVAN) tablet 1 mg  1 mg Oral Q4H PRN Parks Ranger, DO       magnesium hydroxide (MILK OF MAGNESIA) suspension 30 mL  30 mL Oral Daily PRN Patrecia Pour, NP       midazolam (VERSED) injection 4 mg  4 mg Intravenous Once Seana Underwood, Madie Reno, MD       mirtazapine (REMERON) tablet 15 mg  15 mg Oral QHS Parks Ranger, DO   15 mg at 10/22/22 2126   multivitamin with minerals tablet 1 tablet  1 tablet  Oral Daily Jordin Dambrosio, Madie Reno, MD   1 tablet at 10/23/22 0907   OLANZapine (ZYPREXA) tablet 5 mg  5 mg Oral BH-q8a4p Parks Ranger, DO   5 mg at 10/23/22 0847   ondansetron (ZOFRAN-ODT) disintegrating tablet 4 mg  4 mg Oral Q8H PRN Parks Ranger, DO   4 mg at 10/17/22 1452   oxyCODONE (Oxy IR/ROXICODONE) immediate release tablet 5 mg  5 mg Oral Once PRN Darrin Nipper, MD       Or   oxyCODONE (ROXICODONE) 5 MG/5ML solution 5 mg  5 mg Oral Once PRN Darrin Nipper, MD       oxyCODONE (Oxy IR/ROXICODONE) immediate release tablet 5 mg  5 mg Oral Once PRN Dimas Millin, MD       Or   oxyCODONE (ROXICODONE) 5 MG/5ML solution 5 mg  5 mg Oral Once PRN Dimas Millin, MD       QUEtiapine (SEROQUEL) tablet 50 mg  50 mg Oral QHS Patrecia Pour, NP   50 mg at 10/22/22 2126    Lab Results: No results found for this or any previous visit (from the past 28 hour(s)).  Blood Alcohol level:  Lab Results  Component Value Date   ETH <10 10/09/2022   ETH <10 [id number]    Metabolic Disorder Labs: Lab Results  Component Value Date   HGBA1C 4.9 10/12/2022   MPG 93.93 10/12/2022   No results found for: "PROLACTIN" Lab Results  Component Value Date   CHOL 215 (H) 10/12/2022   TRIG 78 10/12/2022   HDL 87 10/12/2022   CHOLHDL 2.5 10/12/2022   VLDL 16 10/12/2022   LDLCALC 112 (H) 10/12/2022    Physical Findings: AIMS:  , ,  ,  ,    CIWA:    COWS:     Musculoskeletal: Strength & Muscle Tone: within normal limits Gait & Station: normal Patient leans: N/A  Psychiatric Specialty Exam:  Presentation  General Appearance:  Casual; Well Groomed  Eye Contact: Good  Speech: Clear and Coherent; Normal Rate  Speech Volume: Normal  Handedness: Right   Mood and Affect  Mood: Anxious; Depressed; Hopeless  Affect: Congruent; Depressed   Thought Process  Thought Processes: Coherent; Goal Directed; Linear  Descriptions of Associations:Intact  Orientation:Full  (Time, Place and Person)  Thought Content:Logical  History of Schizophrenia/Schizoaffective disorder:No data recorded Duration of Psychotic Symptoms:No data recorded Hallucinations:No data recorded Ideas of Reference:None  Suicidal Thoughts:No data recorded Homicidal  Thoughts:No data recorded  Sensorium  Memory: Immediate Good; Recent Good; Remote Good  Judgment: Impaired  Insight: Fair   Community education officer  Concentration: Good  Attention Span: Good  Recall: Good  Fund of Knowledge: Good  Language: Good   Psychomotor Activity  Psychomotor Activity:No data recorded  Assets  Assets: Communication Skills; Desire for Improvement; Financial Resources/Insurance; Housing; Intimacy; Physical Health   Sleep  Sleep:No data recorded   Physical Exam: Physical Exam Vitals and nursing note reviewed.  Constitutional:      Appearance: Normal appearance.  HENT:     Head: Normocephalic and atraumatic.     Mouth/Throat:     Pharynx: Oropharynx is clear.  Eyes:     Pupils: Pupils are equal, round, and reactive to light.  Cardiovascular:     Rate and Rhythm: Normal rate and regular rhythm.  Pulmonary:     Effort: Pulmonary effort is normal.     Breath sounds: Normal breath sounds.  Abdominal:     General: Abdomen is flat.     Palpations: Abdomen is soft.  Musculoskeletal:        General: Normal range of motion.  Skin:    General: Skin is warm and dry.  Neurological:     General: No focal deficit present.     Mental Status: She is alert. Mental status is at baseline.  Psychiatric:        Attention and Perception: Attention normal.        Mood and Affect: Mood is anxious and depressed. Affect is blunt.        Speech: Speech is delayed.        Behavior: Behavior is slowed.        Thought Content: Thought content normal.        Cognition and Memory: Memory is impaired.    Review of Systems  Constitutional: Negative.   HENT: Negative.    Eyes:  Negative.   Respiratory: Negative.    Cardiovascular: Negative.   Gastrointestinal: Negative.   Musculoskeletal: Negative.   Skin: Negative.   Neurological: Negative.   Psychiatric/Behavioral:  Positive for depression. Negative for hallucinations, substance abuse and suicidal ideas. The patient is nervous/anxious.    Blood pressure (!) 157/88, pulse 61, temperature 98.4 F (36.9 C), temperature source Oral, resp. rate 14, height '5\' 2"'$  (1.575 m), weight 49.4 kg, SpO2 98 %. Body mass index is 19.94 kg/m.   Treatment Plan Summary: Medication management and Plan patient will have ECT treatment number for tomorrow.  Orders are in place for her to be n.p.o.  Patient agreeable to plan.  No change to medicine.  Alethia Berthold, MD 10/23/2022, 1:34 PM

## 2022-10-23 NOTE — Group Note (Signed)
Date:  10/23/2022 Time:  3:12 PM  Group Topic/Focus:  Developing a Wellness Toolbox:   The focus of this group is to help patients develop a "wellness toolbox" with skills and strategies to promote recovery upon discharge. Healthy Communication:   The focus of this group is to discuss communication, barriers to communication, as well as healthy ways to communicate with others. Making Healthy Choices:   The focus of this group is to help patients identify negative/unhealthy choices they were using prior to admission and identify positive/healthier coping strategies to replace them upon discharge. Overcoming Stress:   The focus of this group is to define stress and help patients assess their triggers. Self Care:   The focus of this group is to help patients understand the importance of self-care in order to improve or restore emotional, physical, spiritual, interpersonal, and financial health.    Participation Level:  Active  Participation Quality:  Appropriate  Affect:  Appropriate  Cognitive:  Appropriate  Insight: Appropriate  Engagement in Group:  Improving and Supportive  Modes of Intervention:  Activity, Discussion, Exploration, and Socialization  Additional Comments:    Avis Epley 10/23/2022, 3:12 PM

## 2022-10-24 ENCOUNTER — Encounter: Payer: Self-pay | Admitting: Psychiatry

## 2022-10-24 ENCOUNTER — Ambulatory Visit: Payer: Medicare Other

## 2022-10-24 ENCOUNTER — Inpatient Hospital Stay: Payer: Medicare Other | Admitting: Certified Registered"

## 2022-10-24 DIAGNOSIS — F332 Major depressive disorder, recurrent severe without psychotic features: Secondary | ICD-10-CM | POA: Diagnosis not present

## 2022-10-24 MED ORDER — LIDOCAINE HCL (CARDIAC) PF 100 MG/5ML IV SOSY
PREFILLED_SYRINGE | INTRAVENOUS | Status: DC | PRN
  Administered 2022-10-24: 20 mg via INTRAVENOUS

## 2022-10-24 MED ORDER — GLYCOPYRROLATE 0.2 MG/ML IJ SOLN
0.1000 mg | Freq: Once | INTRAMUSCULAR | Status: AC
  Administered 2022-10-24: 0.1 mg via INTRAVENOUS

## 2022-10-24 MED ORDER — LABETALOL HCL 5 MG/ML IV SOLN
INTRAVENOUS | Status: DC | PRN
  Administered 2022-10-24: 10 mg via INTRAVENOUS

## 2022-10-24 MED ORDER — GLYCOPYRROLATE PF 0.2 MG/ML IJ SOSY
PREFILLED_SYRINGE | INTRAMUSCULAR | Status: DC | PRN
  Administered 2022-10-24: .2 mg via INTRAVENOUS

## 2022-10-24 MED ORDER — PROPOFOL 10 MG/ML IV BOLUS
INTRAVENOUS | Status: DC | PRN
  Administered 2022-10-24: 30 mg via INTRAVENOUS

## 2022-10-24 MED ORDER — KETOROLAC TROMETHAMINE 30 MG/ML IJ SOLN
30.0000 mg | Freq: Once | INTRAMUSCULAR | Status: AC
  Administered 2022-10-24: 30 mg via INTRAVENOUS

## 2022-10-24 MED ORDER — LIDOCAINE HCL (PF) 2 % IJ SOLN
INTRAMUSCULAR | Status: AC
  Filled 2022-10-24: qty 5

## 2022-10-24 MED ORDER — SUCCINYLCHOLINE CHLORIDE 200 MG/10ML IV SOSY
PREFILLED_SYRINGE | INTRAVENOUS | Status: DC | PRN
  Administered 2022-10-24: 60 mg via INTRAVENOUS

## 2022-10-24 MED ORDER — KETOROLAC TROMETHAMINE 30 MG/ML IJ SOLN
INTRAMUSCULAR | Status: AC
  Filled 2022-10-24: qty 1

## 2022-10-24 MED ORDER — KETAMINE HCL 10 MG/ML IJ SOLN
INTRAMUSCULAR | Status: DC | PRN
  Administered 2022-10-24: 40 mg via INTRAVENOUS

## 2022-10-24 MED ORDER — ONDANSETRON HCL 4 MG/2ML IJ SOLN: 4.0000 mg | Freq: Once | INTRAMUSCULAR | Status: DC | PRN

## 2022-10-24 MED ORDER — SUCCINYLCHOLINE CHLORIDE 200 MG/10ML IV SOSY
PREFILLED_SYRINGE | INTRAVENOUS | Status: AC
  Filled 2022-10-24: qty 10

## 2022-10-24 MED ORDER — KETAMINE HCL 50 MG/5ML IJ SOSY
PREFILLED_SYRINGE | INTRAMUSCULAR | Status: AC
  Filled 2022-10-24: qty 5

## 2022-10-24 MED ORDER — GLYCOPYRROLATE 0.2 MG/ML IJ SOLN
INTRAMUSCULAR | Status: AC
  Filled 2022-10-24: qty 1

## 2022-10-24 MED ORDER — PROPOFOL 10 MG/ML IV BOLUS
INTRAVENOUS | Status: AC
  Filled 2022-10-24: qty 20

## 2022-10-24 MED ORDER — MIDAZOLAM HCL 2 MG/2ML IJ SOLN
INTRAMUSCULAR | Status: AC
  Filled 2022-10-24: qty 2

## 2022-10-24 MED ORDER — MIDAZOLAM HCL 2 MG/2ML IJ SOLN
4.0000 mg | Freq: Once | INTRAMUSCULAR | Status: AC
  Administered 2022-10-28: 4 mg via INTRAVENOUS

## 2022-10-24 MED ORDER — FENTANYL CITRATE PF 50 MCG/ML IJ SOSY: 25.0000 ug | PREFILLED_SYRINGE | INTRAMUSCULAR | Status: DC | PRN

## 2022-10-24 MED ORDER — MIDAZOLAM HCL 2 MG/2ML IJ SOLN
INTRAMUSCULAR | Status: DC | PRN
  Administered 2022-10-24: 4 mg via INTRAVENOUS

## 2022-10-24 MED ORDER — SODIUM CHLORIDE 0.9 % IV SOLN
500.0000 mL | Freq: Once | INTRAVENOUS | Status: AC
  Administered 2022-10-24: 500 mL via INTRAVENOUS

## 2022-10-24 MED ORDER — LABETALOL HCL 5 MG/ML IV SOLN
INTRAVENOUS | Status: AC
  Filled 2022-10-24: qty 4

## 2022-10-24 NOTE — Group Note (Signed)
Recreation Therapy Group Note   Group Topic:General Recreation  Group Date: 10/24/2022 Start Time: 1400 End Time: 1445 Facilitators: Vilma Prader, LRT, CTRS Location: Courtyard  Group Description: Rummy. LRT and patients played the card game "Rummy" outside while getting fresh air and sunlight, with music playing in the background. LRT and pts discussed how they have or have not ever played card games, where they use to play, and who they use to play with. LRT an pts discussed how this could be a leisure interest post-discharge.   Affect/Mood: Appropriate and Flat   Participation Level: Active and Engaged   Participation Quality: Independent   Behavior: Alert and Appropriate   Speech/Thought Process: Coherent   Insight: Good   Judgement: Good   Modes of Intervention: Cooperative Play   Patient Response to Interventions:  Attentive, Engaged, Interested , and Receptive   Education Outcome:  Acknowledges education   Clinical Observations/Individualized Feedback: Caitlyn [name] was active in their participation of session activities and group discussion. Pt required encouragement to join group. Pt shared "I just feel weird", referring to recently getting back from ECT off unit. Pt came outside with a ginger ale in hand. Pt shared that it had been years since she had played any sort of card game but said that she forget she enjoyed it as much as she did.   Plan: Continue to engage patient in RT group sessions 2-3x/week.   Vilma Prader, LRT, CTRS 10/24/2022 2:58 PM

## 2022-10-24 NOTE — Progress Notes (Signed)
[facility] MD Progress Note  10/24/2022 11:38 AM Caitlyn [name]  MRN:  [id number] Subjective: Caitlyn [name] was seen on rounds.  She is very nervous about getting ECT for the fourth time.  She has not had much relief from it and she still flat and depressed.  Continues to be negative.  She is able to contract for safety and denies any suicidal ideation.  In looking through her chart she does not respond well to antidepressants.  She has had some relief from the Zyprexa. Principal Problem: Major depressive disorder, recurrent severe without psychotic features ([place]) Diagnosis: Principal Problem:   Major depressive disorder, recurrent severe without psychotic features ([place])  Total Time spent with patient: 15 minutes  Past Psychiatric History: She saw a psychiatrist in [place] before moving to [place] and has been to Dr. Reece Levy twice for [name].   Past Medical History:  Past Medical History:  Diagnosis Date   Anxiety    Depression    Hypertension     Past Surgical History:  Procedure Laterality Date   EYE SURGERY     Family History:  Family History  Problem Relation Age of Onset   Breast cancer Neg Hx    Family Psychiatric  History: Unremarkable Social History:  Social History   Substance and Sexual Activity  Alcohol Use Never     Social History   Substance and Sexual Activity  Drug Use Never    Social History   Socioeconomic History   Marital status: Married    Spouse name: Not on file   Number of children: Not on file   Years of education: Not on file   Highest education level: Not on file  Occupational History   Not on file  Tobacco Use   Smoking status: Never   Smokeless tobacco: Never  Vaping Use   Vaping Use: Never used  Substance and Sexual Activity   Alcohol use: Never   Drug use: Never   Sexual activity: Not on file  Other Topics Concern   Not on file  Social History Narrative   Not on file   Social Determinants of Health   Financial Resource Strain: Not on file   Food Insecurity: No Food Insecurity (10/10/2022)   Hunger Vital Sign    Worried About Running Out of Food in the Last Year: Never true    Ran Out of Food in the Last Year: Never true  Transportation Needs: No Transportation Needs (10/10/2022)   PRAPARE - Hydrologist (Medical): No    Lack of Transportation (Non-Medical): No  Physical Activity: Not on file  Stress: Not on file  Social Connections: Not on file   Additional Social History:                         Sleep: Good  Appetite:  Good  Current Medications: Current Facility-Administered Medications  Medication Dose Route Frequency Provider Last Rate Last Admin   acetaminophen (TYLENOL) tablet 650 mg  650 mg Oral Q6H PRN Patrecia Pour, NP       alum & mag hydroxide-simeth (MAALOX/MYLANTA) 200-200-20 MG/5ML suspension 30 mL  30 mL Oral Q4H PRN Patrecia Pour, NP       buPROPion (WELLBUTRIN XL) 24 hr tablet 300 mg  300 mg Oral q morning Patrecia Pour, NP   300 mg at 10/24/22 [id number]   clonazePAM (KLONOPIN) tablet 0.5 mg  0.5 mg Oral QHS Clapacs, Madie Reno, MD  0.5 mg at 10/23/22 2101   escitalopram (LEXAPRO) tablet 20 mg  20 mg Oral QPC breakfast Parks Ranger, DO   20 mg at 10/24/22 [id number]   lactated ringers infusion   Intravenous Continuous Darrin Nipper, MD   New Bag at 10/21/22 1247   lisinopril (ZESTRIL) tablet 10 mg  10 mg Oral Daily Patrecia Pour, NP   10 mg at 10/24/22 [id number]   LORazepam (ATIVAN) tablet 1 mg  1 mg Oral Q4H PRN Parks Ranger, DO       magnesium hydroxide (MILK OF MAGNESIA) suspension 30 mL  30 mL Oral Daily PRN Patrecia Pour, NP       midazolam (VERSED) injection 4 mg  4 mg Intravenous Once Clapacs, John T, MD       mirtazapine (REMERON) tablet 15 mg  15 mg Oral QHS Parks Ranger, DO   15 mg at 10/23/22 2101   multivitamin with minerals tablet 1 tablet  1 tablet Oral Daily Clapacs, Madie Reno, MD   1 tablet at 10/23/22 0907   OLANZapine  (ZYPREXA) tablet 5 mg  5 mg Oral BH-q8a4p Parks Ranger, DO   5 mg at 10/24/22 0850   ondansetron (ZOFRAN-ODT) disintegrating tablet 4 mg  4 mg Oral Q8H PRN Parks Ranger, DO   4 mg at 10/17/22 1452   oxyCODONE (Oxy IR/ROXICODONE) immediate release tablet 5 mg  5 mg Oral Once PRN Darrin Nipper, MD       Or   oxyCODONE (ROXICODONE) 5 MG/5ML solution 5 mg  5 mg Oral Once PRN Darrin Nipper, MD       QUEtiapine (SEROQUEL) tablet 50 mg  50 mg Oral QHS Patrecia Pour, NP   50 mg at 10/23/22 2101    Lab Results: No results found for this or any previous visit (from the past 48 hour(s)).  Blood Alcohol level:  Lab Results  Component Value Date   ETH <10 10/09/2022   ETH <10 [id number]    Metabolic Disorder Labs: Lab Results  Component Value Date   HGBA1C 4.9 10/12/2022   MPG 93.93 10/12/2022   No results found for: "PROLACTIN" Lab Results  Component Value Date   CHOL 215 (H) 10/12/2022   TRIG 78 10/12/2022   HDL 87 10/12/2022   CHOLHDL 2.5 10/12/2022   VLDL 16 10/12/2022   LDLCALC 112 (H) 10/12/2022    Physical Findings: AIMS:  , ,  ,  ,    CIWA:    COWS:     Musculoskeletal: Strength & Muscle Tone: within normal limits Gait & Station: normal Patient leans: N/A  Psychiatric Specialty Exam:  Presentation  General Appearance:  Casual; Well Groomed  Eye Contact: Good  Speech: Clear and Coherent; Normal Rate  Speech Volume: Normal  Handedness: Right   Mood and Affect  Mood: Anxious; Depressed; Hopeless  Affect: Congruent; Depressed   Thought Process  Thought Processes: Coherent; Goal Directed; Linear  Descriptions of Associations:Intact  Orientation:Full (Time, Place and Person)  Thought Content:Logical  History of Schizophrenia/Schizoaffective disorder:No data recorded Duration of Psychotic Symptoms:No data recorded Hallucinations:No data recorded Ideas of Reference:None  Suicidal Thoughts:No data recorded Homicidal  Thoughts:No data recorded  Sensorium  Memory: Immediate Good; Recent Good; Remote Good  Judgment: Impaired  Insight: Fair   Community education officer  Concentration: Good  Attention Span: Good  Recall: Good  Fund of Knowledge: Good  Language: Good   Psychomotor Activity  Psychomotor Activity:No data recorded  Assets  Assets: Communication  Skills; Desire for Improvement; Financial Resources/Insurance; Housing; Intimacy; Physical Health   Sleep  Sleep:No data recorded   Physical Exam: Physical Exam Vitals and nursing note reviewed.  Constitutional:      Appearance: Normal appearance. She is normal weight.  Neurological:     General: No focal deficit present.     Mental Status: She is alert and oriented to person, place, and time.  Psychiatric:        Attention and Perception: Attention and perception normal.        Mood and Affect: Mood is depressed. Affect is labile and flat.        Speech: Speech normal.        Behavior: Behavior normal. Behavior is cooperative.        Thought Content: Thought content normal.        Cognition and Memory: Cognition and memory normal.        Judgment: Judgment normal.    Review of Systems  Constitutional: Negative.   HENT: Negative.    Eyes: Negative.   Respiratory: Negative.    Cardiovascular: Negative.   Gastrointestinal: Negative.   Genitourinary: Negative.   Musculoskeletal: Negative.   Skin: Negative.   Neurological: Negative.   Endo/Heme/Allergies: Negative.   Psychiatric/Behavioral:  Positive for depression.    Blood pressure (!) 135/125, pulse 67, temperature 98.4 F (36.9 C), temperature source Oral, resp. rate 18, height '5\' 2"'$  (1.575 m), weight 49.4 kg, SpO2 96 %. Body mass index is 19.94 kg/m.   Treatment Plan Summary: Daily contact with patient to assess and evaluate symptoms and progress in treatment, Medication management, and Plan continue current medications.  Parks Ranger,  DO 10/24/2022, 11:38 AM

## 2022-10-24 NOTE — Progress Notes (Signed)
Patient off the unit transporting to ECT procedure.

## 2022-10-24 NOTE — Transfer of Care (Signed)
Immediate Anesthesia Transfer of Care Note  Patient: Caitlyn [name]  Procedure(s) Performed: ECT TX  Patient Location: PACU  Anesthesia Type:General  Level of Consciousness: sedated  Airway & Oxygen Therapy: Patient Spontanous Breathing  Post-op Assessment: Report given to RN and Post -op Vital signs reviewed and stable  Post vital signs: Reviewed and stable  Last Vitals:  Vitals Value Taken Time  BP 135/86 10/24/22 1239  Temp 36.4 C 10/24/22 1239  Pulse 50 10/24/22 1240  Resp 17 10/24/22 1240  SpO2 97 % 10/24/22 1240  Vitals shown include unvalidated device data.  Last Pain:  Vitals:   10/24/22 1239  TempSrc:   PainSc: 0-No pain         Complications: No notable events documented.

## 2022-10-24 NOTE — Procedures (Signed)
ECT SERVICES Physician's Interval Evaluation & Treatment Note  Patient Identification: Caitlyn [name] MRN:  [id number] Date of Evaluation:  10/24/2022 TX #: [id number]  MADRS:   MMSE:   P.E. Findings:  No change to physical exam.  Blood pressure was very high when she came for treatment but after calming her down it became more normal.  Psychiatric Interval Note:  Patient unable to articulate a change.  Certainly still very anxious.  We noticed there is actually been some improvement in her cognition  Subjective:  Patient is a 77 y.o. female seen for evaluation for Electroconvulsive Therapy. Simply focused on her anxiety  Treatment Summary:   '[x]'$   Right Unilateral             '[]'$  Bilateral   % Energy : 0.3 ms 100%   Impedance: 1540 ohms  Seizure Energy Index: 22,921 V squared  Postictal Suppression Index: 57%  Seizure Concordance Index: 88%  Medications  Pre Shock: Robinul 0.1 mg with another point to added by anesthesia, Toradol 30 mg propofol 30 mg ketamine 40 mg succinylcholine 60 mg  Post Shock: Versed 4 mg  Seizure Duration: 8 seconds EMG 18 seconds EEG   Comments: Short seizure.  Next time we will use all ketamine am recommending 70 mg.  Lungs:  '[x]'$   Clear to auscultation               '[]'$  Other:   Heart:    '[x]'$   Regular rhythm             '[]'$  irregular rhythm    '[x]'$   Previous H&P reviewed, patient examined and there are NO CHANGES                 '[]'$   Previous H&P reviewed, patient examined and there are changes noted.   Alethia Berthold, MD 3/4/20242:11 PM

## 2022-10-24 NOTE — Group Note (Signed)
Date:  10/24/2022 Time:  12:38 PM  Group Topic/Focus:  Goals Group:   The focus of this group is to help patients establish daily goals to achieve during treatment and discuss how the patient can incorporate goal setting into their daily lives to aide in recovery. Self Care:   The focus of this group is to help patients understand the importance of self-care in order to improve or restore emotional, physical, spiritual, interpersonal, and financial health.    Participation Level:  Did Not Attend  Participation Quality:    Affect:    Cognitive:    Insight:   Engagement in Group:    Modes of Intervention:    Additional Comments:  did not attend   Ladona Mow 10/24/2022, 12:38 PM

## 2022-10-24 NOTE — Plan of Care (Signed)
  Problem: Education: Goal: Knowledge of General Education information will improve Description: Including pain rating scale, medication(s)/side effects and non-pharmacologic comfort measures Outcome: Progressing   Problem: Health Behavior/Discharge Planning: Goal: Ability to manage health-related needs will improve Outcome: Progressing   Problem: Clinical Measurements: Goal: Ability to maintain clinical measurements within normal limits will improve Outcome: Progressing Goal: Will remain free from infection Outcome: Progressing Goal: Diagnostic test results will improve Outcome: Progressing Goal: Respiratory complications will improve Outcome: Progressing Goal: Cardiovascular complication will be avoided Outcome: Progressing   Problem: Coping: Goal: Level of anxiety will decrease Outcome: Not Progressing   Problem: Elimination: Goal: Will not experience complications related to bowel motility Outcome: Progressing Goal: Will not experience complications related to urinary retention Outcome: Progressing

## 2022-10-24 NOTE — Anesthesia Preprocedure Evaluation (Signed)
Anesthesia Evaluation  Patient identified by MRN, date of birth, ID band Patient awake    Reviewed: Allergy & Precautions, NPO status , Patient's Chart, lab work & pertinent test results  Airway Mallampati: III  TM Distance: >3 FB Neck ROM: full    Dental  (+) Teeth Intact   Pulmonary neg pulmonary ROS   Pulmonary exam normal breath sounds clear to auscultation       Cardiovascular Exercise Tolerance: Good hypertension, Pt. on medications negative cardio ROS Normal cardiovascular exam Rhythm:Regular     Neuro/Psych   Anxiety Depression    negative neurological ROS  negative psych ROS   GI/Hepatic negative GI ROS, Neg liver ROS,,,  Endo/Other  negative endocrine ROS    Renal/GU negative Renal ROS  negative genitourinary   Musculoskeletal   Abdominal Normal abdominal exam  (+)   Peds negative pediatric ROS (+)  Hematology negative hematology ROS (+)   Anesthesia Other Findings Past Medical History: No date: Anxiety No date: Depression No date: Hypertension  Past Surgical History: No date: EYE SURGERY  BMI    Body Mass Index: 19.94 kg/m      Reproductive/Obstetrics negative OB ROS                             Anesthesia Physical Anesthesia Plan  ASA: 2  Anesthesia Plan: General   Post-op Pain Management:    Induction: Intravenous  PONV Risk Score and Plan: Propofol infusion and TIVA  Airway Management Planned: Natural Airway and Mask  Additional Equipment:   Intra-op Plan:   Post-operative Plan:   Informed Consent: I have reviewed the patients History and Physical, chart, labs and discussed the procedure including the risks, benefits and alternatives for the proposed anesthesia with the patient or authorized representative who has indicated his/her understanding and acceptance.     Dental Advisory Given  Plan Discussed with: CRNA and Surgeon  Anesthesia Plan  Comments:        Anesthesia Quick Evaluation

## 2022-10-24 NOTE — Progress Notes (Signed)
   10/23/22 2300  Psych Admission Type (Psych Patients Only)  Admission Status Voluntary  Psychosocial Assessment  Patient Complaints Anxiety;Depression  Eye Contact Fair  Facial Expression Anxious  Affect Appropriate to circumstance;Depressed  Speech Unremarkable  Interaction Assertive  Motor Activity Slow  Appearance/Hygiene Unremarkable  Behavior Characteristics Cooperative;Appropriate to situation  Mood Pleasant  Thought Process  Coherency WDL  Content WDL  Delusions None reported or observed  Perception WDL  Hallucination None reported or observed  Judgment WDL  Confusion None  Danger to Self  Current suicidal ideation? Denies  Agreement Not to Harm Self Yes  Description of Agreement VERBAL  Danger to Others  Danger to Others None reported or observed   Patient is compliant with treatment. Rated anxiety 8/10 and Depression 5/10 stated she had a better day today she was in group and went outside. Patient continues not feel anxious about ECT but is willing to do. Support and encouragement provided.  Q 15 minutes safety checks ongoing. Patient remains safe.

## 2022-10-24 NOTE — Anesthesia Procedure Notes (Signed)
Procedure Name: General with mask airway Date/Time: 10/24/2022 12:28 PM  Performed by: Biagio Borg, CRNAPre-anesthesia Checklist: Patient identified, Emergency Drugs available, Suction available, Patient being monitored and Timeout performed Patient Re-evaluated:Patient Re-evaluated prior to induction Oxygen Delivery Method: Circle system utilized Preoxygenation: Pre-oxygenation with 100% oxygen Induction Type: IV induction Ventilation: Mask ventilation without difficulty Placement Confirmation: positive ETCO2 and CO2 detector Dental Injury: Teeth and Oropharynx as per pre-operative assessment

## 2022-10-24 NOTE — Group Note (Signed)
[facility] LCSW Group Therapy Note    Group Date: 10/24/2022 Start Time: 1300 End Time: 1340  Type of Therapy and Topic:  Group Therapy:  Overcoming Obstacles  Participation Level:  [facility] PARTICIPATION LEVEL: Did Not Attend   Description of Group:   In this group patients will be encouraged to explore what they see as obstacles to their own wellness and recovery. They will be guided to discuss their thoughts, feelings, and behaviors related to these obstacles. The group will process together ways to cope with barriers, with attention given to specific choices patients can make. Each patient will be challenged to identify changes they are motivated to make in order to overcome their obstacles. This group will be process-oriented, with patients participating in exploration of their own experiences as well as giving and receiving support and challenge from other group members.  Therapeutic Goals: 1. Patient will identify personal and current obstacles as they relate to admission. 2. Patient will identify barriers that currently interfere with their wellness or overcoming obstacles.  3. Patient will identify feelings, thought process and behaviors related to these barriers. 4. Patient will identify two changes they are willing to make to overcome these obstacles:    Summary of Patient Progress   X   Therapeutic Modalities:   Cognitive Behavioral Therapy Solution Focused Therapy Motivational Interviewing Relapse Prevention Therapy   Yena Tisby A [place], LCSWA

## 2022-10-24 NOTE — Progress Notes (Signed)
D- Patient alert and oriented x 4. Patient presents with an anxious mood and affect. Patient denies SI, HI, AVH, and pain. Patient endorses anxiety of 6. MD made aware. All medications for anxiety administered per MD as an exception for patient's high anxiety.   A- Scheduled medications administered to patient, per MD orders. Support and encouragement provided.  Routine safety checks conducted every 15 minutes.  Patient informed to notify staff with problems or concerns.  R- No adverse drug reactions noted. Patient contracts for safety at this time. Patient compliant with medications and treatment plan. Patient receptive, calm, and cooperative. Patient interacts well with others on the unit.  Patient remains safe at this time.   10/24/22 0745  Charting Type  Charting Type Shift assessment  Safety Check Verification  Has the RN verified the 15 minute safety check completion? Yes  Neurological  Neuro (WDL) WDL  Orientation Level Oriented X4  Cognition Appropriate at baseline  Neuro Symptoms Anxiety  Neuro symptoms relieved by Rest;Relaxation techniques (Comment);Anti-anxiety medication  HEENT  HEENT (WDL) WDL  Lips Symmetrical;Dry  Teeth Poor dental hygiene  Respiratory  Respiratory (WDL) WDL  Cardiac  Cardiac (WDL) WDL  Vascular  Vascular (WDL) WDL  Integumentary  Integumentary (WDL) WDL  Braden Scale (Ages 8 and up)  Sensory Perceptions 4  Moisture 4  Activity 3  Mobility 4  Nutrition 3  Friction and Shear 3  Braden Scale Score 21  Musculoskeletal  Musculoskeletal (WDL) WDL  Assistive Device None  Gastrointestinal  Gastrointestinal (WDL) WDL  Last BM Date  10/23/22 (patient states she had 2 bowel movements)  GU Assessment  Genitourinary (WDL) WDL  Urine Characteristics  Hygiene Shower;Hair washed  Neurological  Level of Consciousness Alert

## 2022-10-24 NOTE — H&P (Signed)
Caitlyn [name] is an 77 y.o. female.   Chief Complaint: Patient seen for ECT.  As usual when we see her she is extremely nervous and anxious.  Not able to really clarify whether mood is different HPI: History of severe depression  Past Medical History:  Diagnosis Date   Anxiety    Depression    Hypertension     Past Surgical History:  Procedure Laterality Date   EYE SURGERY      Family History  Problem Relation Age of Onset   Breast cancer Neg Hx    Social History:  reports that she has never smoked. She has never used smokeless tobacco. She reports that she does not drink alcohol and does not use drugs.  Allergies:  Allergies  Allergen Reactions   Penicillins     Medications Prior to Admission  Medication Sig Dispense Refill   buPROPion (WELLBUTRIN XL) 300 MG 24 hr tablet Take 300 mg by mouth every morning.     cycloSPORINE (RESTASIS) 0.05 % ophthalmic emulsion Place 1 drop into both eyes daily.     FLUoxetine (PROZAC) 20 MG capsule Take 20-40 mg by mouth See admin instructions. Takes 20 mg and 40 mg on alternate days.     lisinopril (ZESTRIL) 10 MG tablet Take 10 mg by mouth daily.     QUEtiapine (SEROQUEL) 50 MG tablet Take 50 mg by mouth at bedtime.      No results found for this or any previous visit (from the past 48 hour(s)). No results found.  Review of Systems  Constitutional: Negative.   HENT: Negative.    Eyes: Negative.   Respiratory: Negative.    Cardiovascular: Negative.   Gastrointestinal: Negative.   Musculoskeletal: Negative.   Skin: Negative.   Neurological: Negative.   Psychiatric/Behavioral:  Positive for dysphoric mood. Negative for self-injury and suicidal ideas. The patient is nervous/anxious.     Blood pressure (!) 162/98, pulse (!) 49, temperature 98.4 F (36.9 C), temperature source Oral, resp. rate 16, height '5\' 2"'$  (1.575 m), weight 49.4 kg, SpO2 98 %. Physical Exam   Assessment/Plan Patient is on treatment #4.  We have some reasons to  think that she may be getting more functional.  Seems to be tolerating treatment well.  Encourage patient to let us continue with ECT course.  Alethia Berthold, MD 10/24/2022, 2:10 PM

## 2022-10-25 DIAGNOSIS — F332 Major depressive disorder, recurrent severe without psychotic features: Secondary | ICD-10-CM | POA: Diagnosis not present

## 2022-10-25 NOTE — Progress Notes (Addendum)
Patient alert and cooperative during shift.  She visited with her daughter-in-law until the end of visiting hours.  No complaints or concerns voiced.  She ambulated from the dayroom to her room without difficulty.  She excepted her medications without difficulty.  She was encouraged by this Games developer.  She reported to this Probation officer "I took a shower".  When asked about her hair being dry after reporting haven taken a shower, she stated "I didn't get it wet".  This reported shower was not witnessed by staff.  She also reported that she brushed her teeth, but no staff witnessed this task.  She reported that "nobody brought me a change of clothes".  She currently in resting in bed with closed eyes on her right side.  No s/sx of acute distress.  No medication side effects observed or reported.  No anxiety observed.  This Probation officer will continue to monitor throughout the shift.

## 2022-10-25 NOTE — Progress Notes (Signed)
   10/25/22 1935  Psych Admission Type (Psych Patients Only)  Admission Status Voluntary  Psychosocial Assessment  Patient Complaints Anxiety;Depression  Eye Contact Fair  Facial Expression Anxious  Affect Anxious  Speech Logical/coherent  Interaction Minimal  Motor Activity Slow  Appearance/Hygiene Unremarkable  Behavior Characteristics Cooperative;Anxious  Mood Anxious  Thought Process  Coherency WDL  Content WDL  Delusions None reported or observed  Perception WDL  Hallucination None reported or observed  Judgment WDL  Confusion None  Danger to Self  Current suicidal ideation? Denies  Danger to Others  Danger to Others None reported or observed   Progress note   D: Pt seen in her room. Pt denies SI, HI, AVH. Pt rates pain  0/10. Pt rates anxiety  6/10 and depression  6/10. Pt maintains that her anxiety is high. Says that ECT treatments are "rough." Pt states that she attends groups and is eating all of her meals. Sleep is good. Tolerating medications. Says that the provider is making some changes to her medication regimen. Discussed ECT in the morning and that she is to remain NPO after midnight. Pt verbalized understanding.  No other concerns noted at this time.  A: Pt provided support and encouragement. Pt given scheduled medication as prescribed. PRNs as appropriate. Q15 min checks for safety.   R: Pt safe on the unit. Will continue to monitor.

## 2022-10-25 NOTE — Progress Notes (Signed)
D- Patient alert and oriented x 4. Patient pleasantly anxious during assessment. Patient denies SI, HI, AVH, and pain. Patient endorses anxiety 6/10. Patient denies depression. PRN anxiety medication offered. Patient declines PRN anxiety medication. Writer encouraged patient to inform staff if anxiety worsens.   A- Scheduled medications administered to patient, per MD orders. Support and encouragement provided.  Routine safety checks conducted every 15 minutes.  Patient informed to notify staff with problems or concerns.  R- No adverse drug reactions noted. Patient contracts for safety at this time. Patient compliant with medications and treatment plan. Patient receptive, calm, and cooperative. Patient interacts well with others on the unit. Patient participated in morning group. Patient remains safe at this time.   10/25/22 0934  Charting Type  Charting Type Shift assessment  Safety Check Verification  Has the RN verified the 15 minute safety check completion? Yes  Neurological  Neuro (WDL) WDL  Orientation Level Oriented X4  Cognition Appropriate at baseline  Speech Clear  Neuro Symptoms Anxiety  HEENT  HEENT (WDL) X  Lips Symmetrical  Teeth Poor dental hygiene  Respiratory  Respiratory (WDL) WDL  Respiratory Pattern Regular;Unlabored  Chest Assessment Chest expansion symmetrical  Cardiac  Cardiac (WDL) WDL  Vascular  Vascular (WDL) WDL  Integumentary  Integumentary (WDL) X  Skin Color Appropriate for ethnicity  Skin Integrity Ecchymosis  Ecchymosis Location Wrist  Ecchymosis Location Orientation Right  Braden Scale (Ages 8 and up)  Sensory Perceptions 4  Moisture 3  Activity 3  Mobility 4  Nutrition 3  Friction and Shear 3  Braden Scale Score 20  Musculoskeletal  Musculoskeletal (WDL) WDL  Assistive Device None  Gastrointestinal  Gastrointestinal (WDL) WDL  Last BM Date  10/24/22  GU Assessment  Genitourinary (WDL) WDL  Neurological  Level of Consciousness Alert

## 2022-10-25 NOTE — Progress Notes (Signed)
[facility] MD Progress Note  10/25/2022 1:27 PM Caitlyn [name]  MRN:  [id number] Subjective: Caitlyn [name] is seen on rounds.  Her affect is much better but she states that she does not feel much better.  We discussed having 1 more ECT treatment and then going home on Friday and she was happy about that.  She denies any suicidal ideation.  She is tolerating her medications but does not think that they are working.  I told her when she follows up that they can start increasing her medications.  Principal Problem: Major depressive disorder, recurrent severe without psychotic features ([place]) Diagnosis: Principal Problem:   Major depressive disorder, recurrent severe without psychotic features ([place])  Total Time spent with patient: 15 minutes  Past Psychiatric History: She has seen Dr. Reece Levy twice for [place].  Past Medical History:  Past Medical History:  Diagnosis Date   Anxiety    Depression    Hypertension     Past Surgical History:  Procedure Laterality Date   EYE SURGERY     Family History:  Family History  Problem Relation Age of Onset   Breast cancer Neg Hx    Family Psychiatric  History: Unremarkable Social History:  Social History   Substance and Sexual Activity  Alcohol Use Never     Social History   Substance and Sexual Activity  Drug Use Never    Social History   Socioeconomic History   Marital status: Married    Spouse name: Not on file   Number of children: Not on file   Years of education: Not on file   Highest education level: Not on file  Occupational History   Not on file  Tobacco Use   Smoking status: Never   Smokeless tobacco: Never  Vaping Use   Vaping Use: Never used  Substance and Sexual Activity   Alcohol use: Never   Drug use: Never   Sexual activity: Not on file  Other Topics Concern   Not on file  Social History Narrative   Not on file   Social Determinants of Health   Financial Resource Strain: Not on file  Food Insecurity: No Food Insecurity  (10/10/2022)   Hunger Vital Sign    Worried About Running Out of Food in the Last Year: Never true    Ran Out of Food in the Last Year: Never true  Transportation Needs: No Transportation Needs (10/10/2022)   PRAPARE - Hydrologist (Medical): No    Lack of Transportation (Non-Medical): No  Physical Activity: Not on file  Stress: Not on file  Social Connections: Not on file   Additional Social History:                         Sleep: Good  Appetite:  Good  Current Medications: Current Facility-Administered Medications  Medication Dose Route Frequency Provider Last Rate Last Admin   acetaminophen (TYLENOL) tablet 650 mg  650 mg Oral Q6H PRN Patrecia Pour, NP       alum & mag hydroxide-simeth (MAALOX/MYLANTA) 200-200-20 MG/5ML suspension 30 mL  30 mL Oral Q4H PRN Patrecia Pour, NP       buPROPion (WELLBUTRIN XL) 24 hr tablet 300 mg  300 mg Oral q morning Patrecia Pour, NP   300 mg at 10/25/22 0816   clonazePAM (KLONOPIN) tablet 0.5 mg  0.5 mg Oral QHS Clapacs, Madie Reno, MD   0.5 mg at 10/24/22 2132  escitalopram (LEXAPRO) tablet 20 mg  20 mg Oral QPC breakfast [place], DO   20 mg at 10/25/22 0816   fentaNYL (SUBLIMAZE) injection 25 mcg  25 mcg Intravenous Q5 min PRN [place] Service, Jane Canary, MD       lactated ringers infusion   Intravenous Continuous Darrin Nipper, MD   Continued from Pre-op at 10/24/22 1217   lisinopril (ZESTRIL) tablet 10 mg  10 mg Oral Daily Patrecia Pour, NP   10 mg at 10/25/22 0816   LORazepam (ATIVAN) tablet 1 mg  1 mg Oral Q4H PRN [place] Ranger, DO       magnesium hydroxide (MILK OF MAGNESIA) suspension 30 mL  30 mL Oral Daily PRN Patrecia Pour, NP       midazolam (VERSED) injection 4 mg  4 mg Intravenous Once Clapacs, Madie Reno, MD       mirtazapine (REMERON) tablet 15 mg  15 mg Oral QHS [place] Ranger, DO   15 mg at 10/24/22 2136   multivitamin with minerals tablet 1 tablet  1  tablet Oral Daily Clapacs, Madie Reno, MD   1 tablet at 10/25/22 0816   OLANZapine (ZYPREXA) tablet 5 mg  5 mg Oral [id number] [place], DO   5 mg at 10/25/22 0816   ondansetron (ZOFRAN) injection 4 mg  4 mg Intravenous Once PRN [place] Service, Jane Canary, MD       ondansetron (ZOFRAN-ODT) disintegrating tablet 4 mg  4 mg Oral Q8H PRN [place], DO   4 mg at 10/17/22 1452   oxyCODONE (Oxy IR/ROXICODONE) immediate release tablet 5 mg  5 mg Oral Once PRN Darrin Nipper, MD       Or   oxyCODONE (ROXICODONE) 5 MG/5ML solution 5 mg  5 mg Oral Once PRN Darrin Nipper, MD       QUEtiapine (SEROQUEL) tablet 50 mg  50 mg Oral QHS Patrecia Pour, NP   50 mg at 10/24/22 2132    Lab Results: No results found for this or any previous visit (from the past 48 hour(s)).  Blood Alcohol level:  Lab Results  Component Value Date   ETH <10 10/09/2022   ETH <10 [id number]    Metabolic Disorder Labs: Lab Results  Component Value Date   HGBA1C 4.9 10/12/2022   MPG 93.93 10/12/2022   No results found for: "PROLACTIN" Lab Results  Component Value Date   CHOL 215 (H) 10/12/2022   TRIG 78 10/12/2022   HDL 87 10/12/2022   CHOLHDL 2.5 10/12/2022   VLDL 16 10/12/2022   LDLCALC 112 (H) 10/12/2022    Physical Findings: AIMS:  , ,  ,  ,    CIWA:    COWS:     Musculoskeletal: Strength & Muscle Tone: within normal limits Gait & Station: normal Patient leans: N/A  Psychiatric Specialty Exam:  Presentation  General Appearance:  Casual; Well Groomed  Eye Contact: Good  Speech: Clear and Coherent; Normal Rate  Speech Volume: Normal  Handedness: Right   Mood and Affect  Mood: Anxious; Depressed; Hopeless  Affect: Congruent; Depressed   Thought Process  Thought Processes: Coherent; Goal Directed; Linear  Descriptions of Associations:Intact  Orientation:Full (Time, Place and Person)  Thought Content:Logical  History of Schizophrenia/Schizoaffective  disorder:No data recorded Duration of Psychotic Symptoms:No data recorded Hallucinations:No data recorded Ideas of Reference:None  Suicidal Thoughts:No data recorded Homicidal Thoughts:No data recorded  Sensorium  Memory: Immediate Good; Recent Good; Remote Good  Judgment: Impaired  Insight: Fair   Materials engineer: Good  Attention Span: Good  Recall: Roel Cluck of Knowledge: Good  Language: Good   Psychomotor Activity  Psychomotor Activity:No data recorded  Assets  Assets: Communication Skills; Desire for Improvement; Financial Resources/Insurance; Housing; Intimacy; Physical Health   Sleep  Sleep:No data recorded   Physical Exam: Physical Exam Vitals and nursing note reviewed.  Constitutional:      Appearance: Normal appearance. She is normal weight.  Neurological:     General: No focal deficit present.     Mental Status: She is alert and oriented to person, place, and time.  Psychiatric:        Attention and Perception: Attention and perception normal.        Mood and Affect: Mood is anxious and depressed. Affect is labile.        Speech: Speech normal.        Behavior: Behavior normal. Behavior is cooperative.        Thought Content: Thought content normal.        Cognition and Memory: Cognition and memory normal.        Judgment: Judgment normal.    Review of Systems  Constitutional: Negative.   HENT: Negative.    Eyes: Negative.   Respiratory: Negative.    Cardiovascular: Negative.   Gastrointestinal: Negative.   Genitourinary: Negative.   Musculoskeletal: Negative.   Skin: Negative.   Neurological: Negative.   Endo/Heme/Allergies: Negative.   Psychiatric/Behavioral:  Positive for depression. The patient is nervous/anxious.    Blood pressure (!) 148/65, pulse (!) 51, temperature 98.9 F (37.2 C), temperature source Oral, resp. rate 18, height '5\' 2"'$  (1.575 m), weight 49.4 kg, SpO2 96 %. Body mass index is 19.94  kg/m.   Treatment Plan Summary: Daily contact with patient to assess and evaluate symptoms and progress in treatment, Medication management, and Plan continue current medications.  [place], DO 10/25/2022, 1:27 PM

## 2022-10-25 NOTE — Plan of Care (Signed)
  Problem: Education: Goal: Knowledge of General Education information will improve Description: Including pain rating scale, medication(s)/side effects and non-pharmacologic comfort measures Outcome: Progressing   Problem: Health Behavior/Discharge Planning: Goal: Ability to manage health-related needs will improve Outcome: Progressing   Problem: Clinical Measurements: Goal: Ability to maintain clinical measurements within normal limits will improve Outcome: Progressing Goal: Will remain free from infection Outcome: Progressing Goal: Diagnostic test results will improve Outcome: Progressing Goal: Respiratory complications will improve Outcome: Progressing Goal: Cardiovascular complication will be avoided Outcome: Progressing   Problem: Nutrition: Goal: Adequate nutrition will be maintained Outcome: Progressing   Problem: Coping: Goal: Level of anxiety will decrease Outcome: Not Progressing   Problem: Elimination: Goal: Will not experience complications related to bowel motility Outcome: Progressing Goal: Will not experience complications related to urinary retention Outcome: Progressing   Problem: Safety: Goal: Ability to remain free from injury will improve Outcome: Progressing   Problem: Skin Integrity: Goal: Risk for impaired skin integrity will decrease Outcome: Progressing

## 2022-10-26 ENCOUNTER — Inpatient Hospital Stay: Payer: Medicare Other | Admitting: Certified Registered Nurse Anesthetist

## 2022-10-26 ENCOUNTER — Other Ambulatory Visit: Payer: Self-pay | Admitting: Psychiatry

## 2022-10-26 DIAGNOSIS — F332 Major depressive disorder, recurrent severe without psychotic features: Secondary | ICD-10-CM | POA: Diagnosis not present

## 2022-10-26 LAB — GLUCOSE, CAPILLARY: Glucose-Capillary: 105 mg/dL — ABNORMAL HIGH (ref 70–99)

## 2022-10-26 MED ORDER — MIDAZOLAM HCL 2 MG/2ML IJ SOLN: 4.0000 mg | Freq: Once | INTRAMUSCULAR | Status: DC

## 2022-10-26 MED ORDER — SODIUM CHLORIDE 0.9 % IV SOLN: 500.0000 mL | Freq: Once | INTRAVENOUS | Status: AC

## 2022-10-26 MED ORDER — KETAMINE HCL 50 MG/5ML IJ SOSY
PREFILLED_SYRINGE | INTRAMUSCULAR | Status: AC
  Filled 2022-10-26: qty 5

## 2022-10-26 MED ORDER — FENTANYL CITRATE PF 50 MCG/ML IJ SOSY: 25.0000 ug | PREFILLED_SYRINGE | INTRAMUSCULAR | Status: DC | PRN

## 2022-10-26 MED ORDER — KETOROLAC TROMETHAMINE 30 MG/ML IJ SOLN
30.0000 mg | Freq: Once | INTRAMUSCULAR | Status: DC
  Filled 2022-10-26: qty 1

## 2022-10-26 MED ORDER — LIDOCAINE HCL (CARDIAC) PF 100 MG/5ML IV SOSY
PREFILLED_SYRINGE | INTRAVENOUS | Status: DC | PRN
  Administered 2022-10-26: 20 mg via INTRAVENOUS

## 2022-10-26 MED ORDER — LABETALOL HCL 5 MG/ML IV SOLN
INTRAVENOUS | Status: DC | PRN
  Administered 2022-10-26: 10 mg via INTRAVENOUS

## 2022-10-26 MED ORDER — MIDAZOLAM HCL 2 MG/2ML IJ SOLN
INTRAMUSCULAR | Status: AC
  Filled 2022-10-26: qty 2

## 2022-10-26 MED ORDER — KETAMINE HCL 10 MG/ML IJ SOLN
INTRAMUSCULAR | Status: DC | PRN
  Administered 2022-10-26: 70 mg via INTRAVENOUS

## 2022-10-26 MED ORDER — ONDANSETRON HCL 4 MG/2ML IJ SOLN: 4.0000 mg | Freq: Once | INTRAMUSCULAR | Status: DC | PRN

## 2022-10-26 MED ORDER — GLYCOPYRROLATE 0.2 MG/ML IJ SOLN
0.2000 mg | Freq: Once | INTRAMUSCULAR | Status: DC
  Filled 2022-10-26: qty 1

## 2022-10-26 MED ORDER — MIDAZOLAM HCL 2 MG/2ML IJ SOLN
INTRAMUSCULAR | Status: DC | PRN
  Administered 2022-10-26: 4 mg via INTRAVENOUS

## 2022-10-26 MED ORDER — SUCCINYLCHOLINE CHLORIDE 200 MG/10ML IV SOSY
PREFILLED_SYRINGE | INTRAVENOUS | Status: DC | PRN
  Administered 2022-10-26: 60 mg via INTRAVENOUS

## 2022-10-26 NOTE — Progress Notes (Signed)
   10/26/22 1930  Psych Admission Type (Psych Patients Only)  Admission Status Voluntary  Psychosocial Assessment  Patient Complaints Anxiety;Depression  Eye Contact Fair  Facial Expression Anxious  Affect Anxious  Speech Logical/coherent  Interaction Minimal  Motor Activity Slow  Appearance/Hygiene Unremarkable  Behavior Characteristics Cooperative;Anxious  Mood Anxious  Thought Process  Coherency WDL  Content WDL  Delusions None reported or observed  Perception WDL  Hallucination None reported or observed  Judgment WDL  Confusion None  Danger to Self  Current suicidal ideation? Denies  Danger to Others  Danger to Others None reported or observed   Progress note   D: Pt seen in her room laying down. Pt denies SI, HI, AVH. Pt rates pain  0/10. Pt rates anxiety  5-6/10 and depression  5-6/10. Pt said she was dizzy after her ECT treatment today. Pt did eat dinner. Staff helped pt complete meal menu for tomorrow.  No other concerns noted at this time.  A: Pt provided support and encouragement. Pt given scheduled medication as prescribed. PRNs as appropriate. Q15 min checks for safety.   R: Pt safe on the unit. Will continue to monitor.

## 2022-10-26 NOTE — Anesthesia Procedure Notes (Signed)
Procedure Name: Awake intubation Date/Time: 10/26/2022 1:55 PM  Performed by: Johnna Acosta, CRNAPre-anesthesia Checklist: Patient identified, Timeout performed, Emergency Drugs available, Suction available and Patient being monitored Patient Re-evaluated:Patient Re-evaluated prior to induction Oxygen Delivery Method: Circle system utilized Preoxygenation: Pre-oxygenation with 100% oxygen Induction Type: IV induction

## 2022-10-26 NOTE — Group Note (Signed)
[place] LCSW Group Therapy Note   Group Date: 10/26/2022 Start Time: 1300 End Time: 1345   Type of Therapy/Topic:  Group Therapy:  Emotion Regulation  Participation Level:  Did Not Attend   Mood:  Description of Group:    The purpose of this group is to assist patients in learning to regulate negative emotions and experience positive emotions. Patients will be guided to discuss ways in which they have been vulnerable to their negative emotions. These vulnerabilities will be juxtaposed with experiences of positive emotions or situations, and patients challenged to use positive emotions to combat negative ones. Special emphasis will be placed on coping with negative emotions in conflict situations, and patients will process healthy conflict resolution skills.  Therapeutic Goals: Patient will identify two positive emotions or experiences to reflect on in order to balance out negative emotions:  Patient will label two or more emotions that they find the most difficult to experience:  Patient will be able to demonstrate positive conflict resolution skills through discussion or role plays:   Summary of Patient Progress:   X    Therapeutic Modalities:   Cognitive Behavioral Therapy Feelings Identification Dialectical Behavioral Therapy   Caitlyn [name], LCSWA

## 2022-10-26 NOTE — Procedures (Signed)
ECT SERVICES Physician's Interval Evaluation & Treatment Note  Patient Identification: Caitlyn [name] MRN:  [id number] Date of Evaluation:  10/26/2022 TX #: 5  MADRS:   MMSE:   P.E. Findings:  No change to physical exam  Psychiatric Interval Note:  Still anxious but generally seems more alert open and conversant than previously  Subjective:  Patient is a 77 y.o. female seen for evaluation for Electroconvulsive Therapy. Still feels nervous.  Treatment Summary:   '[x]'$   Right Unilateral             '[]'$  Bilateral   % Energy : 0.3 ms 100%   Impedance: 2110 ohms  Seizure Energy Index: 10,510 V squared  Postictal Suppression Index: 69%  Seizure Concordance Index: 80%  Medications  Pre Shock: Robinul 0.2 mg Toradol 30 mg ketamine 70 mg succinylcholine 60 mg  Post Shock: Versed 4 mg  Seizure Duration: 19 seconds EMG 32 seconds EEG   Comments: Recommend following up with last treatment on Friday  Lungs:  '[x]'$   Clear to auscultation               '[]'$  Other:   Heart:    '[x]'$   Regular rhythm             '[]'$  irregular rhythm    '[x]'$   Previous H&P reviewed, patient examined and there are NO CHANGES                 '[]'$   Previous H&P reviewed, patient examined and there are changes noted.   Alethia Berthold, MD 3/6/20245:08 PM

## 2022-10-26 NOTE — H&P (Signed)
Kissa Hoole is an 77 y.o. female.   Chief Complaint: still anxious. Not much insight. Denies any suicidal ideation HPI: severe depression  with SI  Past Medical History:  Diagnosis Date   Anxiety    Depression    Hypertension     Past Surgical History:  Procedure Laterality Date   EYE SURGERY      Family History  Problem Relation Age of Onset   Breast cancer Neg Hx    Social History:  reports that she has never smoked. She has never used smokeless tobacco. She reports that she does not drink alcohol and does not use drugs.  Allergies:  Allergies  Allergen Reactions   Penicillins     Medications Prior to Admission  Medication Sig Dispense Refill   buPROPion (WELLBUTRIN XL) 300 MG 24 hr tablet Take 300 mg by mouth every morning.     cycloSPORINE (RESTASIS) 0.05 % ophthalmic emulsion Place 1 drop into both eyes daily.     FLUoxetine (PROZAC) 20 MG capsule Take 20-40 mg by mouth See admin instructions. Takes 20 mg and 40 mg on alternate days.     lisinopril (ZESTRIL) 10 MG tablet Take 10 mg by mouth daily.     QUEtiapine (SEROQUEL) 50 MG tablet Take 50 mg by mouth at bedtime.      Results for orders placed or performed during the hospital encounter of 10/10/22 (from the past 48 hour(s))  Glucose, capillary     Status: Abnormal   Collection Time: 10/26/22  8:18 AM  Result Value Ref Range   Glucose-Capillary 105 (H) 70 - 99 mg/dL    Comment: Glucose reference range applies only to samples taken after fasting for at least 8 hours.   No results found.  Review of Systems  Constitutional: Negative.   HENT: Negative.    Eyes: Negative.   Respiratory: Negative.    Cardiovascular: Negative.   Gastrointestinal: Negative.   Musculoskeletal: Negative.   Skin: Negative.   Neurological: Negative.   Psychiatric/Behavioral:  The patient is nervous/anxious.     Blood pressure 138/83, pulse (!) 55, temperature 98.6 F (37 C), temperature source Oral, resp. rate 18, height '5\' 2"'$   (1.575 m), weight 49.4 kg, SpO2 97 %. Physical Exam Vitals reviewed.  Constitutional:      Appearance: She is well-developed.  HENT:     Head: Normocephalic and atraumatic.  Eyes:     Conjunctiva/sclera: Conjunctivae normal.     Pupils: Pupils are equal, round, and reactive to light.  Cardiovascular:     Heart sounds: Normal heart sounds.  Pulmonary:     Effort: Pulmonary effort is normal.  Abdominal:     Palpations: Abdomen is soft.  Musculoskeletal:        General: Normal range of motion.     Cervical back: Normal range of motion.  Skin:    General: Skin is warm and dry.  Neurological:     General: No focal deficit present.     Mental Status: She is alert.  Psychiatric:        Attention and Perception: Attention normal.        Mood and Affect: Mood is anxious. Affect is blunt.        Speech: Speech is delayed.        Behavior: Behavior is slowed.        Thought Content: Thought content does not include suicidal ideation.        Cognition and Memory: Memory is impaired.  Assessment/Plan Treat today. Advise treatment Friday  Alethia Berthold, MD 10/26/2022, 12:30 PM

## 2022-10-26 NOTE — Progress Notes (Signed)
[facility] MD Progress Note  10/26/2022 2:50 PM Caitlyn [name]  MRN:  [id number] Subjective: Caitlyn [name] is seen on rounds.  She is going to have another ECT today.  She continues to be anxious and depressed but denies any suicidal thoughts.  She has been compliant with medications but is skeptical as to whether they are going to work.  She remains isolative to her room for the most part.  Her affect is a little bit better.  Sleeping and eating well. Principal Problem: Major depressive disorder, recurrent severe without psychotic features ([place]) Diagnosis: Principal Problem:   Major depressive disorder, recurrent severe without psychotic features ([place])  Total Time spent with patient: 15 minutes  Past Psychiatric History: History of depression and anxiety.  Past Medical History:  Past Medical History:  Diagnosis Date   Anxiety    Depression    Hypertension     Past Surgical History:  Procedure Laterality Date   EYE SURGERY     Family History:  Family History  Problem Relation Age of Onset   Breast cancer Neg Hx    Family Psychiatric  History: Unremarkable Social History:  Social History   Substance and Sexual Activity  Alcohol Use Never     Social History   Substance and Sexual Activity  Drug Use Never    Social History   Socioeconomic History   Marital status: Married    Spouse name: Not on file   Number of children: Not on file   Years of education: Not on file   Highest education level: Not on file  Occupational History   Not on file  Tobacco Use   Smoking status: Never   Smokeless tobacco: Never  Vaping Use   Vaping Use: Never used  Substance and Sexual Activity   Alcohol use: Never   Drug use: Never   Sexual activity: Not on file  Other Topics Concern   Not on file  Social History Narrative   Not on file   Social Determinants of Health   Financial Resource Strain: Not on file  Food Insecurity: No Food Insecurity (10/10/2022)   Hunger Vital Sign    Worried About  Running Out of Food in the Last Year: Never true    Ran Out of Food in the Last Year: Never true  Transportation Needs: No Transportation Needs (10/10/2022)   PRAPARE - Hydrologist (Medical): No    Lack of Transportation (Non-Medical): No  Physical Activity: Not on file  Stress: Not on file  Social Connections: Not on file   Additional Social History:                         Sleep: Good  Appetite:  Good  Current Medications: Current Facility-Administered Medications  Medication Dose Route Frequency Provider Last Rate Last Admin   0.9 %  sodium chloride infusion  500 mL Intravenous Once Clapacs, Madie Reno, MD       acetaminophen (TYLENOL) tablet 650 mg  650 mg Oral Q6H PRN Patrecia Pour, NP       alum & mag hydroxide-simeth (MAALOX/MYLANTA) 200-200-20 MG/5ML suspension 30 mL  30 mL Oral Q4H PRN Patrecia Pour, NP       buPROPion (WELLBUTRIN XL) 24 hr tablet 300 mg  300 mg Oral q morning Patrecia Pour, NP   300 mg at 10/26/22 0850   clonazePAM (KLONOPIN) tablet 0.5 mg  0.5 mg Oral QHS Clapacs, Madie Reno,  MD   0.5 mg at 10/25/22 2110   escitalopram (LEXAPRO) tablet 20 mg  20 mg Oral QPC breakfast Parks Ranger, DO   20 mg at 10/26/22 0849   fentaNYL (SUBLIMAZE) injection 25 mcg  25 mcg Intravenous Q5 min PRN [place] Service, Jane Canary, MD       glycopyrrolate (ROBINUL) injection 0.2 mg  0.2 mg Intravenous Once Clapacs, Madie Reno, MD       ketorolac (TORADOL) 30 MG/ML injection 30 mg  30 mg Intravenous Once Clapacs, Madie Reno, MD       lactated ringers infusion   Intravenous Continuous Darrin Nipper, MD   New Bag at 10/26/22 1418   lisinopril (ZESTRIL) tablet 10 mg  10 mg Oral Daily Patrecia Pour, NP   10 mg at 10/26/22 0849   LORazepam (ATIVAN) tablet 1 mg  1 mg Oral Q4H PRN Parks Ranger, DO       magnesium hydroxide (MILK OF MAGNESIA) suspension 30 mL  30 mL Oral Daily PRN Patrecia Pour, NP       midazolam (VERSED) injection 4  mg  4 mg Intravenous Once Clapacs, Madie Reno, MD       midazolam (VERSED) injection 4 mg  4 mg Intravenous Once Clapacs, Madie Reno, MD       mirtazapine (REMERON) tablet 15 mg  15 mg Oral QHS Parks Ranger, DO   15 mg at 10/25/22 2111   multivitamin with minerals tablet 1 tablet  1 tablet Oral Daily Clapacs, Madie Reno, MD   1 tablet at 10/26/22 0850   OLANZapine (ZYPREXA) tablet 5 mg  5 mg Oral [id number] Parks Ranger, DO   5 mg at 10/26/22 0849   ondansetron (ZOFRAN) injection 4 mg  4 mg Intravenous Once PRN [place] Service, Jane Canary, MD       ondansetron (ZOFRAN-ODT) disintegrating tablet 4 mg  4 mg Oral Q8H PRN Parks Ranger, DO   4 mg at 10/17/22 1452   oxyCODONE (Oxy IR/ROXICODONE) immediate release tablet 5 mg  5 mg Oral Once PRN Darrin Nipper, MD       Or   oxyCODONE (ROXICODONE) 5 MG/5ML solution 5 mg  5 mg Oral Once PRN Darrin Nipper, MD       QUEtiapine (SEROQUEL) tablet 50 mg  50 mg Oral QHS Patrecia Pour, NP   50 mg at 10/25/22 2110    Lab Results:  Results for orders placed or performed during the hospital encounter of 10/10/22 (from the past 48 hour(s))  Glucose, capillary     Status: Abnormal   Collection Time: 10/26/22  8:18 AM  Result Value Ref Range   Glucose-Capillary 105 (H) 70 - 99 mg/dL    Comment: Glucose reference range applies only to samples taken after fasting for at least 8 hours.    Blood Alcohol level:  Lab Results  Component Value Date   ETH <10 10/09/2022   ETH <10 [id number]    Metabolic Disorder Labs: Lab Results  Component Value Date   HGBA1C 4.9 10/12/2022   MPG 93.93 10/12/2022   No results found for: "PROLACTIN" Lab Results  Component Value Date   CHOL 215 (H) 10/12/2022   TRIG 78 10/12/2022   HDL 87 10/12/2022   CHOLHDL 2.5 10/12/2022   VLDL 16 10/12/2022   LDLCALC 112 (H) 10/12/2022    Physical Findings: AIMS:  , ,  ,  ,    CIWA:    COWS:  Musculoskeletal: Strength & Muscle Tone: within normal  limits Gait & Station: normal Patient leans: N/A  Psychiatric Specialty Exam:  Presentation  General Appearance:  Casual; Well Groomed  Eye Contact: Good  Speech: Clear and Coherent; Normal Rate  Speech Volume: Normal  Handedness: Right   Mood and Affect  Mood: Anxious; Depressed; Hopeless  Affect: Congruent; Depressed   Thought Process  Thought Processes: Coherent; Goal Directed; Linear  Descriptions of Associations:Intact  Orientation:Full (Time, Place and Person)  Thought Content:Logical  History of Schizophrenia/Schizoaffective disorder:No data recorded Duration of Psychotic Symptoms:No data recorded Hallucinations:No data recorded Ideas of Reference:None  Suicidal Thoughts:No data recorded Homicidal Thoughts:No data recorded  Sensorium  Memory: Immediate Good; Recent Good; Remote Good  Judgment: Impaired  Insight: Fair   Community education officer  Concentration: Good  Attention Span: Good  Recall: Good  Fund of Knowledge: Good  Language: Good   Psychomotor Activity  Psychomotor Activity:No data recorded  Assets  Assets: Communication Skills; Desire for Improvement; Financial Resources/Insurance; Housing; Intimacy; Physical Health   Sleep  Sleep:No data recorded   Physical Exam: Physical Exam Vitals and nursing note reviewed.  Constitutional:      Appearance: Normal appearance. She is normal weight.  Neurological:     General: No focal deficit present.     Mental Status: She is alert and oriented to person, place, and time.  Psychiatric:        Attention and Perception: Attention and perception normal.        Mood and Affect: Mood is depressed. Affect is flat.        Speech: Speech normal.        Behavior: Behavior normal. Behavior is cooperative.        Thought Content: Thought content normal.        Cognition and Memory: Cognition and memory normal.        Judgment: Judgment normal.    Review of Systems   Constitutional: Negative.   HENT: Negative.    Eyes: Negative.   Respiratory: Negative.    Cardiovascular: Negative.   Gastrointestinal: Negative.   Genitourinary: Negative.   Musculoskeletal: Negative.   Skin: Negative.   Neurological: Negative.   Endo/Heme/Allergies: Negative.   Psychiatric/Behavioral:  Positive for depression. The patient is nervous/anxious.    Blood pressure (!) 162/92, pulse (!) 46, temperature 97.7 F (36.5 C), resp. rate 17, height '5\' 2"'$  (1.575 m), weight 49.4 kg, SpO2 97 %. Body mass index is 19.94 kg/m.   Treatment Plan Summary: Daily contact with patient to assess and evaluate symptoms and progress in treatment, Medication management, and Plan continue current medication.  [place], DO 10/26/2022, 2:50 PM

## 2022-10-26 NOTE — Anesthesia Postprocedure Evaluation (Signed)
Anesthesia Post Note  Patient: Caitlyn [name]  Procedure(s) Performed: ECT TX  Patient location during evaluation: PACU Anesthesia Type: General Level of consciousness: awake Pain management: pain level controlled Vital Signs Assessment: post-procedure vital signs reviewed and stable Respiratory status: spontaneous breathing Cardiovascular status: stable Anesthetic complications: no   No notable events documented.   Last Vitals:  Vitals:   10/25/22 1943 10/26/22 0755  BP: (!) 163/79 138/83  Pulse: (!) 53 (!) 55  Resp: 18   Temp: 37.3 C 37 C  SpO2: 98% 97%    Last Pain:  Vitals:   10/26/22 0755  TempSrc: Oral  PainSc:                  VAN STAVEREN,Ole Lafon

## 2022-10-26 NOTE — Plan of Care (Signed)
Pt endorses anxiety/depression at this time. Pt denies SI/HI/AVH or pain at this time. Pt is calm and cooperative. Pt is medication compliant. Pt provided with support and encouragement. Pt monitored q15 minutes for safety per unit policy. Plan of care ongoing.   Pt tolerated ECT well. Affect and mood appear to be improved after ECT.   Problem: Education: Goal: Knowledge of General Education information will improve Description: Including pain rating scale, medication(s)/side effects and non-pharmacologic comfort measures Outcome: Progressing   Problem: Coping: Goal: Level of anxiety will decrease Outcome: Not Progressing

## 2022-10-26 NOTE — Progress Notes (Signed)
Per anesthesia Dr. Janeann Forehand ok for patient to return to floor with sbp 160's  HR high 40-50's  Patient awake/alert without c/o' Tolerated sips of water.

## 2022-10-26 NOTE — Anesthesia Preprocedure Evaluation (Addendum)
Anesthesia Evaluation  Patient identified by MRN, date of birth, ID band Patient awake    Reviewed: Allergy & Precautions, NPO status , Patient's Chart, lab work & pertinent test results  Airway Mallampati: IV  TM Distance: >3 FB Neck ROM: Full  Mouth opening: Limited Mouth Opening  Dental  (+) Teeth Intact   Pulmonary neg pulmonary ROS   Pulmonary exam normal breath sounds clear to auscultation       Cardiovascular Exercise Tolerance: Good hypertension, Pt. on medications Normal cardiovascular exam Rhythm:Regular     Neuro/Psych negative neurological ROS  negative psych ROS   GI/Hepatic negative GI ROS, Neg liver ROS,,,  Endo/Other  negative endocrine ROS    Renal/GU negative Renal ROS  negative genitourinary   Musculoskeletal   Abdominal Normal abdominal exam  (+)   Peds negative pediatric ROS (+)  Hematology negative hematology ROS (+)   Anesthesia Other Findings Past Medical History: No date: Anxiety No date: Depression No date: Hypertension  Past Surgical History: No date: EYE SURGERY  BMI    Body Mass Index: 19.94 kg/m      Reproductive/Obstetrics negative OB ROS                              Anesthesia Physical Anesthesia Plan  ASA: 2  Anesthesia Plan: General   Post-op Pain Management:    Induction: Intravenous  PONV Risk Score and Plan: Propofol infusion and TIVA  Airway Management Planned: Natural Airway and Mask  Additional Equipment:   Intra-op Plan:   Post-operative Plan:   Informed Consent: I have reviewed the patients History and Physical, chart, labs and discussed the procedure including the risks, benefits and alternatives for the proposed anesthesia with the patient or authorized representative who has indicated his/her understanding and acceptance.     Dental Advisory Given  Plan Discussed with: CRNA and Surgeon  Anesthesia Plan Comments:          Anesthesia Quick Evaluation

## 2022-10-26 NOTE — Group Note (Signed)
Recreation Therapy Group Note   Group Topic:Emotion Expression  Group Date: 10/26/2022 Start Time: 1400 End Time: 1445 Facilitators: Vilma Prader, LRT, CTRS Location:  Dayroom  Group Description: Expressive Arts Post Card. Patients received a blank postcard template. LRT encouraged pt to create a postcard to their future self by using words and pictures drawn with markers. LRT and pts discussed what they want their futures to look like and the things that they want to achieve.  Affect/Mood: N/A   Participation Level: Did not attend    Clinical Observations/Individualized Feedback: Keeya did not attend group due to being off unit at ECT.   Plan: Continue to engage patient in RT group sessions 2-3x/week.   Vilma Prader, LRT, CTRS 10/26/2022 3:31 PM

## 2022-10-26 NOTE — Transfer of Care (Signed)
Immediate Anesthesia Transfer of Care Note  Patient: Caitlyn [name]  Procedure(s) Performed: ECT TX  Patient Location: Endoscopy Unit  Anesthesia Type:General  Level of Consciousness: sedated  Airway & Oxygen Therapy: Patient Spontanous Breathing and Patient connected to nasal cannula oxygen  Post-op Assessment: Report given to RN and Post -op Vital signs reviewed and stable  Post vital signs: Reviewed and stable  Last Vitals:  Vitals Value Taken Time  BP 163/84 10/26/22 1424  Temp    Pulse 44 10/26/22 1427  Resp 15 10/26/22 1427  SpO2 96 % 10/26/22 1427  Vitals shown include unvalidated device data.  Last Pain:  Vitals:   10/26/22 1403  TempSrc:   PainSc: 0-No pain         Complications: No notable events documented.

## 2022-10-27 DIAGNOSIS — F332 Major depressive disorder, recurrent severe without psychotic features: Secondary | ICD-10-CM | POA: Diagnosis not present

## 2022-10-27 MED ORDER — LISINOPRIL 20 MG PO TABS
20.0000 mg | ORAL_TABLET | Freq: Every day | ORAL | Status: DC
  Administered 2022-10-28 – 2022-11-02 (×6): 20 mg via ORAL
  Filled 2022-10-27 (×6): qty 1

## 2022-10-27 NOTE — Group Note (Signed)
LCSW Group Therapy Note  Group Date: 10/27/2022 Start Time: [id number] End Time: 1325   Type of Therapy and Topic:  Group Therapy: Positive Affirmations  Participation Level:  Did Not Attend   Description of Group:   This group addressed positive affirmation towards self and others.  Patients went around the room and identified two positive things about themselves and two positive things about a peer in the room.  Patients reflected on how it felt to share something positive with others, to identify positive things about themselves, and to hear positive things from others/ Patients were encouraged to have a daily reflection of positive characteristics or circumstances.   Therapeutic Goals: Patients will verbalize two of their positive qualities Patients will demonstrate empathy for others by stating two positive qualities about a peer in the group Patients will verbalize their feelings when voicing positive self affirmations and when voicing positive affirmations of others Patients will discuss the potential positive impact on their wellness/recovery of focusing on positive traits of self and others.  Summary of Patient Progress:  X   Therapeutic Modalities:   Cognitive Behavioral Therapy Motivational Interviewing    Caitlyn [name], [place] 10/27/2022  1:28 PM

## 2022-10-27 NOTE — Progress Notes (Signed)
   10/27/22 1930  Psych Admission Type (Psych Patients Only)  Admission Status Voluntary  Psychosocial Assessment  Patient Complaints Anxiety;Depression  Eye Contact Fair  Facial Expression Anxious  Affect Anxious  Speech Logical/coherent  Interaction Assertive  Motor Activity Slow  Appearance/Hygiene Unremarkable  Behavior Characteristics Cooperative;Appropriate to situation  Mood Anxious  Thought Process  Coherency WDL  Content WDL  Delusions None reported or observed  Perception WDL  Hallucination None reported or observed  Judgment WDL  Confusion None  Danger to Self  Current suicidal ideation? Denies  Danger to Others  Danger to Others None reported or observed   Progress note   D: Pt seen in her room reading a book. Pt denies SI, HI, AVH. Pt rates pain  0/10. Pt rates anxiety  6/10 and depression  6/10. Pt has brighter affect but maintains that she is anxious and depressed. Stated she was in her room most of day because there weren't as many groups or activities to keep her busy. Endorsed good appetite and sleep. Discussed ECT treatment tomorrow. Pt remarked that she is often hungry because treatments are around noon. Pt offered a snack tonight to help with that but she refused. No other concerns noted at this time.  A: Pt provided support and encouragement. Pt given scheduled medication as prescribed. PRNs as appropriate. Q15 min checks for safety.   R: Pt safe on the unit. Will continue to monitor.

## 2022-10-27 NOTE — Group Note (Signed)
Recreation Therapy Group Note   Group Topic:Leisure Education  Group Date: 10/27/2022 Start Time: 1400 End Time: 1435 Facilitators: Vilma Prader, LRT, CTRS Location: Courtyard  Group Description: Leisure. Patients were given the opportunity to journal, walk, or listen to music while sitting in the courtyard getting fresh air and sunlight. Pt identified and conversated about things they enjoy doing in their free time and how they can continue to do that outside of the hospital.  Affect/Mood: N/A   Participation Level: Did not attend    Clinical Observations/Individualized Feedback: Caitlyn [name] did not attend group due to thinking that it would be too cold for her outside and that she was worried she may get a cold, as she has "some congestion" already.  Plan: Continue to engage patient in RT group sessions 2-3x/week.   Vilma Prader, LRT, CTRS 10/27/2022 2:52 PM

## 2022-10-27 NOTE — BH IP Treatment Plan (Signed)
Interdisciplinary Treatment and Diagnostic Plan Update  10/27/2022 Time of Session: 9:30AM Caitlyn [name] MRN: [id number]  Principal Diagnosis: Major depressive disorder, recurrent severe without psychotic features ([place])  Secondary Diagnoses: Principal Problem:   Major depressive disorder, recurrent severe without psychotic features ([place])   Current Medications:  Current Facility-Administered Medications  Medication Dose Route Frequency Provider Last Rate Last Admin   0.9 %  sodium chloride infusion  500 mL Intravenous Once Clapacs, Madie Reno, MD       acetaminophen (TYLENOL) tablet 650 mg  650 mg Oral Q6H PRN Patrecia Pour, NP       alum & mag hydroxide-simeth (MAALOX/MYLANTA) 200-200-20 MG/5ML suspension 30 mL  30 mL Oral Q4H PRN Patrecia Pour, NP       buPROPion (WELLBUTRIN XL) 24 hr tablet 300 mg  300 mg Oral q morning Patrecia Pour, NP   300 mg at 10/27/22 [id number]   clonazePAM (KLONOPIN) tablet 0.5 mg  0.5 mg Oral QHS Clapacs, John T, MD   0.5 mg at 10/26/22 2111   escitalopram (LEXAPRO) tablet 20 mg  20 mg Oral QPC breakfast Parks Ranger, DO   20 mg at 10/27/22 [id number]   fentaNYL (SUBLIMAZE) injection 25 mcg  25 mcg Intravenous Q5 min PRN [place] Service, Jane Canary, MD       glycopyrrolate (ROBINUL) injection 0.2 mg  0.2 mg Intravenous Once Clapacs, Madie Reno, MD       ketorolac (TORADOL) 30 MG/ML injection 30 mg  30 mg Intravenous Once Clapacs, Madie Reno, MD       lactated ringers infusion   Intravenous Continuous Darrin Nipper, MD   New Bag at 10/26/22 1418   lisinopril (ZESTRIL) tablet 10 mg  10 mg Oral Daily Patrecia Pour, NP   10 mg at 10/27/22 [id number]   LORazepam (ATIVAN) tablet 1 mg  1 mg Oral Q4H PRN Parks Ranger, DO       magnesium hydroxide (MILK OF MAGNESIA) suspension 30 mL  30 mL Oral Daily PRN Patrecia Pour, NP       midazolam (VERSED) injection 4 mg  4 mg Intravenous Once Clapacs, Madie Reno, MD       midazolam (VERSED) injection 4 mg  4 mg Intravenous Once  Clapacs, Madie Reno, MD       mirtazapine (REMERON) tablet 15 mg  15 mg Oral QHS Parks Ranger, DO   15 mg at 10/26/22 2111   multivitamin with minerals tablet 1 tablet  1 tablet Oral Daily Clapacs, Madie Reno, MD   1 tablet at 10/27/22 0906   OLANZapine (ZYPREXA) tablet 5 mg  5 mg Oral BH-q8a4p Parks Ranger, DO   5 mg at 10/27/22 0906   ondansetron (ZOFRAN) injection 4 mg  4 mg Intravenous Once PRN [place] Service, Jane Canary, MD       ondansetron (ZOFRAN-ODT) disintegrating tablet 4 mg  4 mg Oral Q8H PRN Parks Ranger, DO   4 mg at 10/17/22 1452   oxyCODONE (Oxy IR/ROXICODONE) immediate release tablet 5 mg  5 mg Oral Once PRN Darrin Nipper, MD       Or   oxyCODONE (ROXICODONE) 5 MG/5ML solution 5 mg  5 mg Oral Once PRN Darrin Nipper, MD       QUEtiapine (SEROQUEL) tablet 50 mg  50 mg Oral QHS Patrecia Pour, NP   50 mg at 10/26/22 2110   PTA Medications: Medications Prior to Admission  Medication Sig Dispense Refill Last Dose  buPROPion (WELLBUTRIN XL) 300 MG 24 hr tablet Take 300 mg by mouth every morning.   10/16/2022   cycloSPORINE (RESTASIS) 0.05 % ophthalmic emulsion Place 1 drop into both eyes daily.   10/16/2022   FLUoxetine (PROZAC) 20 MG capsule Take 20-40 mg by mouth See admin instructions. Takes 20 mg and 40 mg on alternate days.   10/16/2022   lisinopril (ZESTRIL) 10 MG tablet Take 10 mg by mouth daily.   10/16/2022   QUEtiapine (SEROQUEL) 50 MG tablet Take 50 mg by mouth at bedtime.   10/16/2022    Patient Stressors: Other: just moved here from [facility] [place]    Patient Strengths: Capable of independent living  Communication skills  General fund of knowledge  Supportive family/friends   Treatment Modalities: Medication Management, Group therapy, Case management,  1 to 1 session with clinician, Psychoeducation, Recreational therapy.   Physician Treatment Plan for Primary Diagnosis: Major depressive disorder, recurrent severe without psychotic features  ([place]) Long Term Goal(s): Improvement in symptoms so as ready for discharge   Short Term Goals: Compliance with prescribed medications will improve Ability to verbalize feelings will improve Ability to disclose and discuss suicidal ideas Ability to demonstrate self-control will improve Ability to identify and develop effective coping behaviors will improve  Medication Management: Evaluate patient's response, side effects, and tolerance of medication regimen.  Therapeutic Interventions: 1 to 1 sessions, Unit Group sessions and Medication administration.  Evaluation of Outcomes: Progressing  Physician Treatment Plan for Secondary Diagnosis: Principal Problem:   Major depressive disorder, recurrent severe without psychotic features ([place])  Long Term Goal(s): Improvement in symptoms so as ready for discharge   Short Term Goals: Compliance with prescribed medications will improve Ability to verbalize feelings will improve Ability to disclose and discuss suicidal ideas Ability to demonstrate self-control will improve Ability to identify and develop effective coping behaviors will improve     Medication Management: Evaluate patient's response, side effects, and tolerance of medication regimen.  Therapeutic Interventions: 1 to 1 sessions, Unit Group sessions and Medication administration.  Evaluation of Outcomes: Progressing   RN Treatment Plan for Primary Diagnosis: Major depressive disorder, recurrent severe without psychotic features ([place]) Long Term Goal(s): Knowledge of disease and therapeutic regimen to maintain health will improve  Short Term Goals: Ability to remain free from injury will improve, Ability to verbalize frustration and anger appropriately will improve, Ability to demonstrate self-control, Ability to participate in decision making will improve, Ability to verbalize feelings will improve, Ability to disclose and discuss suicidal ideas, Ability to identify and develop  effective coping behaviors will improve, and Compliance with prescribed medications will improve  Medication Management: RN will administer medications as ordered by provider, will assess and evaluate patient's response and provide education to patient for prescribed medication. RN will report any adverse and/or side effects to prescribing provider.  Therapeutic Interventions: 1 on 1 counseling sessions, Psychoeducation, Medication administration, Evaluate responses to treatment, Monitor vital signs and CBGs as ordered, Perform/monitor CIWA, COWS, AIMS and Fall Risk screenings as ordered, Perform wound care treatments as ordered.  Evaluation of Outcomes: Progressing   LCSW Treatment Plan for Primary Diagnosis: Major depressive disorder, recurrent severe without psychotic features ([place]) Long Term Goal(s): Safe transition to appropriate next level of care at discharge, Engage patient in therapeutic group addressing interpersonal concerns.  Short Term Goals: Engage patient in aftercare planning with referrals and resources, Increase social support, Increase ability to appropriately verbalize feelings, Increase emotional regulation, Facilitate acceptance of mental health diagnosis and concerns, and Increase  skills for wellness and recovery  Therapeutic Interventions: Assess for all discharge needs, 1 to 1 time with Social worker, Explore available resources and support systems, Assess for adequacy in community support network, Educate family and significant other(s) on suicide prevention, Complete Psychosocial Assessment, Interpersonal group therapy.  Evaluation of Outcomes: Progressing   Progress in Treatment: Attending groups: Yes. Participating in groups: Yes. Taking medication as prescribed: Yes. Toleration medication: Yes. Family/Significant other contact made: Yes, individual(s) contacted:  SPE completed with pt's daughter in law, Tersia Boeh Patient understands diagnosis:  Yes. Discussing patient identified problems/goals with staff: Yes. Medical problems stabilized or resolved: Yes. Denies suicidal/homicidal ideation: Yes. Issues/concerns per patient self-inventory: No. Other: None  New Short Term/Long Term Goal(s): Patient to work towards medication management for mood stabilization; elimination of SI thoughts; development of comprehensive mental wellness plan. Update 10/22/2022: No changes at this time. Update 10/27/22: No changes at this time.        Patient Goals:  "find motivation" Update 10/22/2022: No changes at this time. Update 10/27/22: No changes at this time.      Discharge Plan or Barriers: CSW will assist pt with development of appropriate discharge/aftercare plan.  Update 10/22/2022: Patient to continue receiving ECT. Will continue to follow for discharge planning. Update 10/27/22: No changes at this time.      Reason for Continuation of Hospitalization: Anxiety Depression Suicidal ideation  Last 3 [place] Suicide Severity Risk Score: Flowsheet Row Admission (Current) from 10/10/2022 in [place] ED from 10/09/2022 in [facility] at [facility] from 01/07/2022 in [facility] at [place] Error: Q3, 4, or 5 should not be populated when Q2 is No High Risk No Risk       Last PHQ 2/9 Scores:     No data to display          Scribe for Treatment Team: Zaylyn Bergdoll A [place], [place] 10/27/2022 9:41 AM

## 2022-10-27 NOTE — Progress Notes (Signed)
[facility] MD Progress Note  10/27/2022 11:55 AM Caitlyn [name]  MRN:  [id number] Subjective: Caitlyn [name] is seen on rounds.  Her affect has improved but she still feels like nothing has helped.  She is agreeable to stay for ECT and I told her through Monday and she was okay with that but reluctant.  She says that she slept well and she continues to ask about her menu so she has a decent appetite.  Compliant with medications and no side effects.  She denies any suicidal ideation.  Principal Problem: Major depressive disorder, recurrent severe without psychotic features ([place]) Diagnosis: Principal Problem:   Major depressive disorder, recurrent severe without psychotic features ([place])  Total Time spent with patient: 15 minutes  Past Psychiatric History:  She saw a psychiatrist in [place] before moving to [place] and has been to Dr. Reece [name] twice for [name].    Past Medical History:  Past Medical History:  Diagnosis Date   Anxiety    Depression    Hypertension     Past Surgical History:  Procedure Laterality Date   EYE SURGERY     Family History:  Family History  Problem Relation Age of Onset   Breast cancer Neg Hx    Family Psychiatric  History: Unremarkable Social History:  Social History   Substance and Sexual Activity  Alcohol Use Never     Social History   Substance and Sexual Activity  Drug Use Never    Social History   Socioeconomic History   Marital status: Married    Spouse name: Not on file   Number of children: Not on file   Years of education: Not on file   Highest education level: Not on file  Occupational History   Not on file  Tobacco Use   Smoking status: Never   Smokeless tobacco: Never  Vaping Use   Vaping Use: Never used  Substance and Sexual Activity   Alcohol use: Never   Drug use: Never   Sexual activity: Not on file  Other Topics Concern   Not on file  Social History Narrative   Not on file   Social Determinants of Health   Financial Resource Strain:  Not on file  Food Insecurity: No Food Insecurity (10/10/2022)   Hunger Vital Sign    Worried About Running Out of Food in the Last Year: Never true    Ran Out of Food in the Last Year: Never true  Transportation Needs: No Transportation Needs (10/10/2022)   PRAPARE - Hydrologist (Medical): No    Lack of Transportation (Non-Medical): No  Physical Activity: Not on file  Stress: Not on file  Social Connections: Not on file   Additional Social History:                         Sleep: Good  Appetite:  Good  Current Medications: Current Facility-Administered Medications  Medication Dose Route Frequency Provider Last Rate Last Admin   0.9 %  sodium chloride infusion  500 mL Intravenous Once Clapacs, Madie Reno, MD       acetaminophen (TYLENOL) tablet 650 mg  650 mg Oral Q6H PRN Patrecia Pour, NP       alum & mag hydroxide-simeth (MAALOX/MYLANTA) 200-200-20 MG/5ML suspension 30 mL  30 mL Oral Q4H PRN Patrecia Pour, NP       buPROPion (WELLBUTRIN XL) 24 hr tablet 300 mg  300 mg Oral q morning Waylan Boga  Y, NP   300 mg at 10/27/22 [id number]   clonazePAM (KLONOPIN) tablet 0.5 mg  0.5 mg Oral QHS Clapacs, John T, MD   0.5 mg at 10/26/22 2111   escitalopram (LEXAPRO) tablet 20 mg  20 mg Oral QPC breakfast Parks Ranger, DO   20 mg at 10/27/22 [id number]   fentaNYL (SUBLIMAZE) injection 25 mcg  25 mcg Intravenous Q5 min PRN [place] Service, Jane Canary, MD       glycopyrrolate (ROBINUL) injection 0.2 mg  0.2 mg Intravenous Once Clapacs, Madie Reno, MD       ketorolac (TORADOL) 30 MG/ML injection 30 mg  30 mg Intravenous Once Clapacs, Madie Reno, MD       lactated ringers infusion   Intravenous Continuous Darrin Nipper, MD   New Bag at 10/26/22 1418   lisinopril (ZESTRIL) tablet 10 mg  10 mg Oral Daily Patrecia Pour, NP   10 mg at 10/27/22 [id number]   LORazepam (ATIVAN) tablet 1 mg  1 mg Oral Q4H PRN Parks Ranger, DO       magnesium hydroxide (MILK OF  MAGNESIA) suspension 30 mL  30 mL Oral Daily PRN Patrecia Pour, NP       midazolam (VERSED) injection 4 mg  4 mg Intravenous Once Clapacs, Madie Reno, MD       midazolam (VERSED) injection 4 mg  4 mg Intravenous Once Clapacs, Madie Reno, MD       mirtazapine (REMERON) tablet 15 mg  15 mg Oral QHS Parks Ranger, DO   15 mg at 10/26/22 2111   multivitamin with minerals tablet 1 tablet  1 tablet Oral Daily Clapacs, Madie Reno, MD   1 tablet at 10/27/22 0906   OLANZapine (ZYPREXA) tablet 5 mg  5 mg Oral BH-q8a4p Parks Ranger, DO   5 mg at 10/27/22 0906   ondansetron (ZOFRAN) injection 4 mg  4 mg Intravenous Once PRN [place] Service, Jane Canary, MD       ondansetron (ZOFRAN-ODT) disintegrating tablet 4 mg  4 mg Oral Q8H PRN Parks Ranger, DO   4 mg at 10/17/22 1452   oxyCODONE (Oxy IR/ROXICODONE) immediate release tablet 5 mg  5 mg Oral Once PRN Darrin Nipper, MD       Or   oxyCODONE (ROXICODONE) 5 MG/5ML solution 5 mg  5 mg Oral Once PRN Darrin Nipper, MD       QUEtiapine (SEROQUEL) tablet 50 mg  50 mg Oral QHS Patrecia Pour, NP   50 mg at 10/26/22 2110    Lab Results:  Results for orders placed or performed during the hospital encounter of 10/10/22 (from the past 48 hour(s))  Glucose, capillary     Status: Abnormal   Collection Time: 10/26/22  8:18 AM  Result Value Ref Range   Glucose-Capillary 105 (H) 70 - 99 mg/dL    Comment: Glucose reference range applies only to samples taken after fasting for at least 8 hours.    Blood Alcohol level:  Lab Results  Component Value Date   ETH <10 10/09/2022   ETH <10 [id number]    Metabolic Disorder Labs: Lab Results  Component Value Date   HGBA1C 4.9 10/12/2022   MPG 93.93 10/12/2022   No results found for: "PROLACTIN" Lab Results  Component Value Date   CHOL 215 (H) 10/12/2022   TRIG 78 10/12/2022   HDL 87 10/12/2022   CHOLHDL 2.5 10/12/2022   VLDL 16 10/12/2022   LDLCALC 112 (H) 10/12/2022  Physical  Findings: AIMS:  , ,  ,  ,    CIWA:    COWS:     Musculoskeletal: Strength & Muscle Tone: within normal limits Gait & Station: normal Patient leans: N/A  Psychiatric Specialty Exam:  Presentation  General Appearance:  Casual; Well Groomed  Eye Contact: Good  Speech: Clear and Coherent; Normal Rate  Speech Volume: Normal  Handedness: Right   Mood and Affect  Mood: Anxious; Depressed; Hopeless  Affect: Congruent; Depressed   Thought Process  Thought Processes: Coherent; Goal Directed; Linear  Descriptions of Associations:Intact  Orientation:Full (Time, Place and Person)  Thought Content:Logical  History of Schizophrenia/Schizoaffective disorder:No data recorded Duration of Psychotic Symptoms:No data recorded Hallucinations:No data recorded Ideas of Reference:None  Suicidal Thoughts:No data recorded Homicidal Thoughts:No data recorded  Sensorium  Memory: Immediate Good; Recent Good; Remote Good  Judgment: Impaired  Insight: Fair   Community education officer  Concentration: Good  Attention Span: Good  Recall: Good  Fund of Knowledge: Good  Language: Good   Psychomotor Activity  Psychomotor Activity:No data recorded  Assets  Assets: Communication Skills; Desire for Improvement; Financial Resources/Insurance; Housing; Intimacy; Physical Health   Sleep  Sleep:No data recorded   Physical Exam: Physical Exam Vitals and nursing note reviewed.  Constitutional:      Appearance: Normal appearance. She is normal weight.  Neurological:     General: No focal deficit present.     Mental Status: She is alert and oriented to person, place, and time.  Psychiatric:        Attention and Perception: Attention and perception normal.        Mood and Affect: Mood is depressed. Affect is flat.        Speech: Speech normal.        Behavior: Behavior is withdrawn. Behavior is cooperative.        Thought Content: Thought content normal.         Cognition and Memory: Cognition and memory normal.        Judgment: Judgment normal.    Review of Systems  Constitutional: Negative.   HENT: Negative.    Eyes: Negative.   Respiratory: Negative.    Cardiovascular: Negative.   Gastrointestinal: Negative.   Genitourinary: Negative.   Musculoskeletal: Negative.   Skin: Negative.   Neurological: Negative.   Endo/Heme/Allergies: Negative.   Psychiatric/Behavioral:  Positive for depression. The patient is nervous/anxious.    Blood pressure (!) 146/80, pulse (!) 57, temperature 98.6 F (37 C), temperature source Oral, resp. rate 16, height '5\' 2"'$  (1.575 m), weight 49.4 kg, SpO2 96 %. Body mass index is 19.94 kg/m.   Treatment Plan Summary: Daily contact with patient to assess and evaluate symptoms and progress in treatment, Medication management, and Plan is to continue ECT and current medications.  Increase lisinopril to 20 mg/day due to blood pressure.  Parks Ranger, DO 10/27/2022, 11:55 AM

## 2022-10-27 NOTE — Plan of Care (Signed)
  Problem: Education: Goal: Knowledge of General Education information will improve Description: Including pain rating scale, medication(s)/side effects and non-pharmacologic comfort measures Outcome: Progressing   Problem: Clinical Measurements: Goal: Ability to maintain clinical measurements within normal limits will improve Outcome: Progressing   Problem: Coping: Goal: Level of anxiety will decrease Outcome: Progressing   

## 2022-10-27 NOTE — Progress Notes (Signed)
Patient in bedroom upon approach. Endorses feelings of anxiety and depression.  "I feel the same as I did yesterday."  Denies SI/HI and AVH.  Denies pain.  Patient reports she slept well last night.  Patient reports she is having some issues with her memory since starting ECT, but also states she knows this was a possible side effect.  Patient has anxious affect, but is cooperative and pleasant.  Compliant with scheduled medications.  15 min checks in place. Patient declined going to SW group because she didn't want to be alone.  Appropriate interaction with peers in milieu.

## 2022-10-28 ENCOUNTER — Ambulatory Visit: Payer: Medicare Other

## 2022-10-28 ENCOUNTER — Other Ambulatory Visit: Payer: Self-pay | Admitting: Psychiatry

## 2022-10-28 ENCOUNTER — Encounter: Payer: Self-pay | Admitting: Psychiatry

## 2022-10-28 ENCOUNTER — Inpatient Hospital Stay: Payer: Medicare Other | Admitting: Certified Registered Nurse Anesthetist

## 2022-10-28 DIAGNOSIS — F332 Major depressive disorder, recurrent severe without psychotic features: Secondary | ICD-10-CM | POA: Diagnosis not present

## 2022-10-28 LAB — GLUCOSE, CAPILLARY: Glucose-Capillary: 110 mg/dL — ABNORMAL HIGH (ref 70–99)

## 2022-10-28 MED ORDER — LIDOCAINE HCL (CARDIAC) PF 100 MG/5ML IV SOSY
PREFILLED_SYRINGE | INTRAVENOUS | Status: DC | PRN
  Administered 2022-10-28: 20 mg via INTRAVENOUS

## 2022-10-28 MED ORDER — KETOROLAC TROMETHAMINE 30 MG/ML IJ SOLN
30.0000 mg | Freq: Once | INTRAMUSCULAR | Status: DC
  Filled 2022-10-28: qty 1

## 2022-10-28 MED ORDER — MIDAZOLAM HCL 2 MG/2ML IJ SOLN
INTRAMUSCULAR | Status: AC
  Filled 2022-10-28: qty 2

## 2022-10-28 MED ORDER — KETAMINE HCL 50 MG/5ML IJ SOSY
PREFILLED_SYRINGE | INTRAMUSCULAR | Status: AC
  Filled 2022-10-28: qty 5

## 2022-10-28 MED ORDER — SODIUM CHLORIDE 0.9 % IV SOLN: 500.0000 mL | Freq: Once | INTRAVENOUS | Status: DC

## 2022-10-28 MED ORDER — ONDANSETRON HCL 4 MG/2ML IJ SOLN: 4.0000 mg | Freq: Once | INTRAMUSCULAR | Status: DC | PRN

## 2022-10-28 MED ORDER — LABETALOL HCL 5 MG/ML IV SOLN
INTRAVENOUS | Status: AC
  Filled 2022-10-28: qty 4

## 2022-10-28 MED ORDER — KETAMINE HCL 10 MG/ML IJ SOLN
INTRAMUSCULAR | Status: DC | PRN
  Administered 2022-10-28: 70 mg via INTRAVENOUS

## 2022-10-28 MED ORDER — GLYCOPYRROLATE 0.2 MG/ML IJ SOLN
0.2000 mg | Freq: Once | INTRAMUSCULAR | Status: DC
  Filled 2022-10-28: qty 1

## 2022-10-28 MED ORDER — SUCCINYLCHOLINE CHLORIDE 200 MG/10ML IV SOSY
PREFILLED_SYRINGE | INTRAVENOUS | Status: DC | PRN
  Administered 2022-10-28: 60 mg via INTRAVENOUS

## 2022-10-28 MED ORDER — PROPOFOL 10 MG/ML IV BOLUS
INTRAVENOUS | Status: AC
  Filled 2022-10-28: qty 20

## 2022-10-28 MED ORDER — MIDAZOLAM HCL 2 MG/2ML IJ SOLN: 4.0000 mg | Freq: Once | INTRAMUSCULAR | Status: DC

## 2022-10-28 MED ORDER — LABETALOL HCL 5 MG/ML IV SOLN
INTRAVENOUS | Status: DC | PRN
  Administered 2022-10-28: 10 mg via INTRAVENOUS

## 2022-10-28 MED ORDER — LIDOCAINE HCL (PF) 2 % IJ SOLN
INTRAMUSCULAR | Status: AC
  Filled 2022-10-28: qty 5

## 2022-10-28 NOTE — Group Note (Signed)
Recreation Therapy Group Note   Group Topic:Self-Esteem  Group Date: 10/28/2022 Start Time: 1400 End Time: 1415 Facilitators: Vilma Prader, LRT, CTRS Location:  Dayroom  Group Description: Patients and LRT discussed the importance of self-love and self-esteem. Pt completed a worksheet that helps them identify 24 different strengths and qualities about themselves. Pt encouraged to read aloud at least 3 off their sheet to the group. Pt's then had the choice to play "Positive Affirmation Bingo" afterwards, with journals or stress balls as bingo prizes.   Affect/Mood: N/A   Participation Level: Did not attend    Clinical Observations/Individualized Feedback: Boneta did not attend group due to being off the unit at ECT.   Plan: Continue to engage patient in RT group sessions 2-3x/week.   Vilma Prader, LRT, CTRS 10/28/2022 2:33 PM

## 2022-10-28 NOTE — Progress Notes (Signed)
Patient awake, aware of surroundings. SBP 160's patient with h/o on HTN meds, did receive this am. Did received '10mg'$  labetalol IV in ECT procedure. Dr. Bertell Maria: anesthesia made aware of elevated bp and HR remains at intervals in 40-50's. Per anesthesia continue to monitor, no intervention at this time.

## 2022-10-28 NOTE — Progress Notes (Signed)
[facility] MD Progress Note  10/28/2022 12:16 PM Caitlyn [name]  MRN:  [id number] Subjective: Caitlyn [name] is seen on rounds.  Nurses report no issues.  She is pleasant and cooperative in her affect is improving.  She has no complaints today which is something new.  I believe she is getting better.  I told her the plan for ECT today and Monday and go home on Wednesday.  She understands and agrees. Principal Problem: Major depressive disorder, recurrent severe without psychotic features ([place]) Diagnosis: Principal Problem:   Major depressive disorder, recurrent severe without psychotic features ([place])  Total Time spent with patient: 15 minutes  Past Psychiatric History:  She saw a psychiatrist in [place] before moving to [place] and has been to Dr. Reece Levy twice for [name].     Past Medical History:  Past Medical History:  Diagnosis Date   Anxiety    Depression    Hypertension     Past Surgical History:  Procedure Laterality Date   EYE SURGERY     Family History:  Family History  Problem Relation Age of Onset   Breast cancer Neg Hx    Family Psychiatric  History: Unremarkable Social History:  Social History   Substance and Sexual Activity  Alcohol Use Never     Social History   Substance and Sexual Activity  Drug Use Never    Social History   Socioeconomic History   Marital status: Married    Spouse name: Not on file   Number of children: Not on file   Years of education: Not on file   Highest education level: Not on file  Occupational History   Not on file  Tobacco Use   Smoking status: Never   Smokeless tobacco: Never  Vaping Use   Vaping Use: Never used  Substance and Sexual Activity   Alcohol use: Never   Drug use: Never   Sexual activity: Not on file  Other Topics Concern   Not on file  Social History Narrative   Not on file   Social Determinants of Health   Financial Resource Strain: Not on file  Food Insecurity: No Food Insecurity (10/10/2022)   Hunger Vital Sign     Worried About Running Out of Food in the Last Year: Never true    Ran Out of Food in the Last Year: Never true  Transportation Needs: No Transportation Needs (10/10/2022)   PRAPARE - Hydrologist (Medical): No    Lack of Transportation (Non-Medical): No  Physical Activity: Not on file  Stress: Not on file  Social Connections: Not on file   Additional Social History:                         Sleep: Good  Appetite:  Good  Current Medications: Current Facility-Administered Medications  Medication Dose Route Frequency Provider Last Rate Last Admin   0.9 %  sodium chloride infusion  500 mL Intravenous Once Clapacs, Madie Reno, MD       acetaminophen (TYLENOL) tablet 650 mg  650 mg Oral Q6H PRN Patrecia Pour, NP       alum & mag hydroxide-simeth (MAALOX/MYLANTA) 200-200-20 MG/5ML suspension 30 mL  30 mL Oral Q4H PRN Patrecia Pour, NP       buPROPion (WELLBUTRIN XL) 24 hr tablet 300 mg  300 mg Oral q morning Patrecia Pour, NP   300 mg at 10/28/22 0900   clonazePAM (KLONOPIN) tablet 0.5 mg  0.5 mg Oral QHS Clapacs, John T, MD   0.5 mg at 10/27/22 2114   escitalopram (LEXAPRO) tablet 20 mg  20 mg Oral QPC breakfast Parks Ranger, DO   20 mg at 10/28/22 0900   fentaNYL (SUBLIMAZE) injection 25 mcg  25 mcg Intravenous Q5 min PRN [place] Service, Jane Canary, MD       glycopyrrolate (ROBINUL) injection 0.2 mg  0.2 mg Intravenous Once Clapacs, Madie Reno, MD       ketorolac (TORADOL) 30 MG/ML injection 30 mg  30 mg Intravenous Once Clapacs, Madie Reno, MD       lactated ringers infusion   Intravenous Continuous Darrin Nipper, MD   New Bag at 10/26/22 1418   lisinopril (ZESTRIL) tablet 20 mg  20 mg Oral Daily Parks Ranger, DO   20 mg at 10/28/22 0900   LORazepam (ATIVAN) tablet 1 mg  1 mg Oral Q4H PRN Parks Ranger, DO       magnesium hydroxide (MILK OF MAGNESIA) suspension 30 mL  30 mL Oral Daily PRN Patrecia Pour, NP       midazolam  (VERSED) injection 4 mg  4 mg Intravenous Once Clapacs, Madie Reno, MD       midazolam (VERSED) injection 4 mg  4 mg Intravenous Once Clapacs, Madie Reno, MD       mirtazapine (REMERON) tablet 15 mg  15 mg Oral QHS Parks Ranger, DO   15 mg at 10/27/22 2114   multivitamin with minerals tablet 1 tablet  1 tablet Oral Daily Clapacs, Madie Reno, MD   1 tablet at 10/28/22 0900   OLANZapine (ZYPREXA) tablet 5 mg  5 mg Oral [id number] Parks Ranger, DO   5 mg at 10/28/22 0900   ondansetron (ZOFRAN) injection 4 mg  4 mg Intravenous Once PRN [place] Service, Jane Canary, MD       ondansetron (ZOFRAN-ODT) disintegrating tablet 4 mg  4 mg Oral Q8H PRN Parks Ranger, DO   4 mg at 10/17/22 1452   oxyCODONE (Oxy IR/ROXICODONE) immediate release tablet 5 mg  5 mg Oral Once PRN Darrin Nipper, MD       Or   oxyCODONE (ROXICODONE) 5 MG/5ML solution 5 mg  5 mg Oral Once PRN Darrin Nipper, MD       QUEtiapine (SEROQUEL) tablet 50 mg  50 mg Oral QHS Patrecia Pour, NP   50 mg at 10/27/22 2114    Lab Results:  Results for orders placed or performed during the hospital encounter of 10/10/22 (from the past 48 hour(s))  Glucose, capillary     Status: Abnormal   Collection Time: 10/28/22 10:36 AM  Result Value Ref Range   Glucose-Capillary 110 (H) 70 - 99 mg/dL    Comment: Glucose reference range applies only to samples taken after fasting for at least 8 hours.    Blood Alcohol level:  Lab Results  Component Value Date   ETH <10 10/09/2022   ETH <10 [id number]    Metabolic Disorder Labs: Lab Results  Component Value Date   HGBA1C 4.9 10/12/2022   MPG 93.93 10/12/2022   No results found for: "PROLACTIN" Lab Results  Component Value Date   CHOL 215 (H) 10/12/2022   TRIG 78 10/12/2022   HDL 87 10/12/2022   CHOLHDL 2.5 10/12/2022   VLDL 16 10/12/2022   LDLCALC 112 (H) 10/12/2022    Physical Findings: AIMS:  , ,  ,  ,    CIWA:  COWS:     Musculoskeletal: Strength & Muscle  Tone: within normal limits Gait & Station: normal Patient leans: N/A  Psychiatric Specialty Exam:  Presentation  General Appearance:  Casual; Well Groomed  Eye Contact: Good  Speech: Clear and Coherent; Normal Rate  Speech Volume: Normal  Handedness: Right   Mood and Affect  Mood: Anxious; Depressed; Hopeless  Affect: Congruent; Depressed   Thought Process  Thought Processes: Coherent; Goal Directed; Linear  Descriptions of Associations:Intact  Orientation:Full (Time, Place and Person)  Thought Content:Logical  History of Schizophrenia/Schizoaffective disorder:No data recorded Duration of Psychotic Symptoms:No data recorded Hallucinations:No data recorded Ideas of Reference:None  Suicidal Thoughts:No data recorded Homicidal Thoughts:No data recorded  Sensorium  Memory: Immediate Good; Recent Good; Remote Good  Judgment: Impaired  Insight: Fair   Community education officer  Concentration: Good  Attention Span: Good  Recall: Good  Fund of Knowledge: Good  Language: Good   Psychomotor Activity  Psychomotor Activity:No data recorded  Assets  Assets: Communication Skills; Desire for Improvement; Financial Resources/Insurance; Housing; Intimacy; Physical Health   Sleep  Sleep:No data recorded   Physical Exam: Physical Exam Vitals and nursing note reviewed.  Constitutional:      Appearance: Normal appearance. She is normal weight.  Neurological:     General: No focal deficit present.     Mental Status: She is alert and oriented to person, place, and time.  Psychiatric:        Attention and Perception: Attention and perception normal.        Mood and Affect: Mood is anxious and depressed. Affect is flat.        Speech: Speech normal.        Behavior: Behavior normal. Behavior is cooperative.        Thought Content: Thought content normal.        Cognition and Memory: Cognition and memory normal.        Judgment: Judgment normal.     Review of Systems  Constitutional: Negative.   HENT: Negative.    Eyes: Negative.   Respiratory: Negative.    Cardiovascular: Negative.   Gastrointestinal: Negative.   Genitourinary: Negative.   Musculoskeletal: Negative.   Skin: Negative.   Neurological: Negative.   Endo/Heme/Allergies: Negative.   Psychiatric/Behavioral: Negative.     Blood pressure 124/85, pulse 66, temperature 98.4 F (36.9 C), temperature source Oral, resp. rate 18, height '5\' 2"'$  (1.575 m), weight 49.4 kg, SpO2 99 %. Body mass index is 19.94 kg/m.   Treatment Plan Summary: Daily contact with patient to assess and evaluate symptoms and progress in treatment, Medication management, and Plan continue current medications.  Parks Ranger, DO 10/28/2022, 12:16 PM

## 2022-10-28 NOTE — Anesthesia Postprocedure Evaluation (Signed)
Anesthesia Post Note  Patient: Caitlyn [name]  Procedure(s) Performed: ECT TX  Patient location during evaluation: PACU Anesthesia Type: General Level of consciousness: awake and awake and alert Pain management: satisfactory to patient Vital Signs Assessment: post-procedure vital signs reviewed and stable Respiratory status: spontaneous breathing and respiratory function stable Cardiovascular status: stable Anesthetic complications: no   No notable events documented.   Last Vitals:  Vitals:   10/27/22 1924 10/28/22 0729  BP: 118/70 124/85  Pulse: (!) 56 66  Resp: 18 18  Temp:  36.9 C  SpO2: 98% 99%    Last Pain:  Vitals:   10/28/22 0729  TempSrc: Oral  PainSc: 0-No pain                 VAN STAVEREN,Cagney Degrace

## 2022-10-28 NOTE — Plan of Care (Signed)
Pt endorses anxiety/depression prior to ECT. Pt denies SI/HI/AVH or pain at this time. Pt is calm and cooperative. Pt is medication compliant. Pt provided with support and encouragement. Pt monitored q15 minutes for safety per unit policy. Plan of care ongoing.   Pt tolerated ECT well today. Pt ate the meal ordered for her directly after returning to the unit as well as dinner. Pt was a little unsteady ambulating upon initially returning to the unit however was steady ambulating independently this evening. Pt has had a BM and voided since returning as well. Pt's mood/affect appears much improved and brighter upon returning from ECT.   Problem: Education: Goal: Knowledge of General Education information will improve Description: Including pain rating scale, medication(s)/side effects and non-pharmacologic comfort measures Outcome: Progressing   Problem: Nutrition: Goal: Adequate nutrition will be maintained Outcome: Progressing

## 2022-10-28 NOTE — Anesthesia Preprocedure Evaluation (Signed)
Anesthesia Evaluation  Patient identified by MRN, date of birth, ID band Patient awake    Reviewed: Allergy & Precautions, NPO status , Patient's Chart, lab work & pertinent test results  History of Anesthesia Complications Negative for: history of anesthetic complications  Airway Mallampati: III  TM Distance: >3 FB Neck ROM: Full  Mouth opening: Limited Mouth Opening  Dental  (+) Teeth Intact   Pulmonary neg pulmonary ROS, neg sleep apnea, neg COPD, Patient abstained from smoking.Not current smoker   Pulmonary exam normal breath sounds clear to auscultation       Cardiovascular Exercise Tolerance: Good METShypertension, Pt. on medications (-) CAD and (-) Past MI Normal cardiovascular exam(-) dysrhythmias  Rhythm:Regular     Neuro/Psych  PSYCHIATRIC DISORDERS Anxiety Depression    negative neurological ROS     GI/Hepatic negative GI ROS, Neg liver ROS,neg GERD  ,,  Endo/Other  negative endocrine ROSneg diabetes    Renal/GU negative Renal ROS  negative genitourinary   Musculoskeletal   Abdominal Normal abdominal exam  (+)   Peds negative pediatric ROS (+)  Hematology negative hematology ROS (+)   Anesthesia Other Findings Past Medical History: No date: Anxiety No date: Depression No date: Hypertension  Past Surgical History: No date: EYE SURGERY  BMI    Body Mass Index: 19.94 kg/m      Reproductive/Obstetrics negative OB ROS                              Anesthesia Physical Anesthesia Plan  ASA: 2  Anesthesia Plan: General   Post-op Pain Management:    Induction: Intravenous  PONV Risk Score and Plan: 3 and Propofol infusion and TIVA  Airway Management Planned: Natural Airway and Mask  Additional Equipment: None  Intra-op Plan:   Post-operative Plan:   Informed Consent: I have reviewed the patients History and Physical, chart, labs and discussed the procedure  including the risks, benefits and alternatives for the proposed anesthesia with the patient or authorized representative who has indicated his/her understanding and acceptance.     Dental Advisory Given  Plan Discussed with: CRNA and Surgeon  Anesthesia Plan Comments: (Discussed risks of anesthesia with patient, including PONV, muscle aches. Rare risks discussed as well, such as cardiorespiratory sequelae, need for airway intervention and its associated risks including lip/dental/eye damage and sore throat, and allergic reactions. Discussed the role of CRNA in patient's perioperative care. Patient understands.)        Anesthesia Quick Evaluation

## 2022-10-28 NOTE — Transfer of Care (Signed)
Immediate Anesthesia Transfer of Care Note  Patient: Caitlyn [name]  Procedure(s) Performed: ECT TX  Patient Location: PACU  Anesthesia Type:General  Level of Consciousness: drowsy  Airway & Oxygen Therapy: Patient Spontanous Breathing and Patient connected to nasal cannula oxygen  Post-op Assessment: Report given to RN  Post vital signs: stable  Last Vitals:  Vitals Value Taken Time  BP 169/94 10/28/22 1336  Temp    Pulse 44 10/28/22 1337  Resp 15 10/28/22 1337  SpO2 96 % 10/28/22 1337  Vitals shown include unvalidated device data.  Last Pain:  Vitals:   10/28/22 1225  TempSrc:   PainSc: 0-No pain         Complications: No notable events documented.

## 2022-10-28 NOTE — Group Note (Signed)
LCSW Group Therapy Note  Group Date: 10/28/2022 Start Time: 1315 End Time: 1330   Type of Therapy and Topic:  Group Therapy - Healthy vs Unhealthy Coping Skills  Participation Level:  Did Not Attend   Description of Group The focus of this group was to determine what unhealthy coping techniques typically are used by group members and what healthy coping techniques would be helpful in coping with various problems. Patients were guided in becoming aware of the differences between healthy and unhealthy coping techniques. Patients were asked to identify 2-3 healthy coping skills they would like to learn to use more effectively.  Therapeutic Goals Patients learned that coping is what human beings do all day long to deal with various situations in their lives Patients defined and discussed healthy vs unhealthy coping techniques Patients identified their preferred coping techniques and identified whether these were healthy or unhealthy Patients determined 2-3 healthy coping skills they would like to become more familiar with and use more often. Patients provided support and ideas to each other   Summary of Patient Progress:   X  Therapeutic Modalities Cognitive Behavioral Therapy Motivational Interviewing  Kennedee Kitzmiller A [place], LCSWA 10/28/2022  1:44 PM

## 2022-10-28 NOTE — H&P (Signed)
Caitlyn [name] is an 77 y.o. female.   Chief Complaint: No specific complaint.  Anxiety continues but objectively appears improved. HPI: Denies suicidal ideation.  Recent severe depression  Past Medical History:  Diagnosis Date   Anxiety    Depression    Hypertension     Past Surgical History:  Procedure Laterality Date   EYE SURGERY      Family History  Problem Relation Age of Onset   Breast cancer Neg Hx    Social History:  reports that she has never smoked. She has never used smokeless tobacco. She reports that she does not drink alcohol and does not use drugs.  Allergies:  Allergies  Allergen Reactions   Penicillins     Medications Prior to Admission  Medication Sig Dispense Refill   buPROPion (WELLBUTRIN XL) 300 MG 24 hr tablet Take 300 mg by mouth every morning.     cycloSPORINE (RESTASIS) 0.05 % ophthalmic emulsion Place 1 drop into both eyes daily.     FLUoxetine (PROZAC) 20 MG capsule Take 20-40 mg by mouth See admin instructions. Takes 20 mg and 40 mg on alternate days.     lisinopril (ZESTRIL) 10 MG tablet Take 10 mg by mouth daily.     QUEtiapine (SEROQUEL) 50 MG tablet Take 50 mg by mouth at bedtime.      Results for orders placed or performed during the hospital encounter of 10/10/22 (from the past 48 hour(s))  Glucose, capillary     Status: Abnormal   Collection Time: 10/28/22 10:36 AM  Result Value Ref Range   Glucose-Capillary 110 (H) 70 - 99 mg/dL    Comment: Glucose reference range applies only to samples taken after fasting for at least 8 hours.   No results found.  Review of Systems  Constitutional: Negative.   HENT: Negative.    Eyes: Negative.   Respiratory: Negative.    Cardiovascular: Negative.   Gastrointestinal: Negative.   Musculoskeletal: Negative.   Skin: Negative.   Neurological: Negative.   Psychiatric/Behavioral: Negative.      Blood pressure (!) 141/96, pulse (!) 54, temperature (!) 97.2 F (36.2 C), temperature source  Temporal, resp. rate 18, height '5\' 2"'$  (1.575 m), weight 49.4 kg, SpO2 99 %. Physical Exam Vitals reviewed.  Constitutional:      Appearance: She is well-developed.  HENT:     Head: Normocephalic and atraumatic.  Eyes:     Conjunctiva/sclera: Conjunctivae normal.     Pupils: Pupils are equal, round, and reactive to light.  Cardiovascular:     Heart sounds: Normal heart sounds.  Pulmonary:     Effort: Pulmonary effort is normal.  Abdominal:     Palpations: Abdomen is soft.  Musculoskeletal:        General: Normal range of motion.     Cervical back: Normal range of motion.  Skin:    General: Skin is warm and dry.  Neurological:     General: No focal deficit present.     Mental Status: She is alert.  Psychiatric:        Attention and Perception: Attention normal.        Mood and Affect: Mood is anxious.        Speech: Speech normal.        Behavior: Behavior normal.        Cognition and Memory: Memory is impaired.      Assessment/Plan Today's treatment #6 we still would like to see her again for another follow-up Monday  Jenny Reichmann  Marchella Hibbard, MD 10/28/2022, 3:55 PM

## 2022-10-28 NOTE — Anesthesia Postprocedure Evaluation (Signed)
Anesthesia Post Note  Patient: Caitlyn [name]  Procedure(s) Performed: ECT TX  Patient location during evaluation: PACU Anesthesia Type: General Level of consciousness: awake and alert Pain management: pain level controlled Vital Signs Assessment: post-procedure vital signs reviewed and stable Respiratory status: spontaneous breathing, nonlabored ventilation, respiratory function stable and patient connected to nasal cannula oxygen Cardiovascular status: blood pressure returned to baseline and stable Postop Assessment: no apparent nausea or vomiting Anesthetic complications: no   No notable events documented.   Last Vitals:  Vitals:   10/28/22 1400 10/28/22 1411  BP: (!) 169/99   Pulse: (!) 52 (!) 54  Resp: 15 18  Temp: (!) 36.3 C (!) 36.2 C  SpO2: 98%     Last Pain:  Vitals:   10/28/22 1411  TempSrc: Temporal  PainSc: 0-No pain                 Ilene Qua

## 2022-10-28 NOTE — Procedures (Signed)
[facility] SERVICES Physician's Interval Evaluation & Treatment Note  Patient Identification: Caitlyn [name] MRN:  [id number] Date of Evaluation:  10/28/2022 TX #: [id number]  MADRS:   MMSE:   P.E. Findings:  No change to physical exam  Psychiatric Interval Note:  Patient not aware of it but overall anxiety and mood looked much improved  Subjective:  Patient is a 76 y.o. female seen for evaluation for Electroconvulsive Therapy. No specific complaint  Treatment Summary:   '[x]'$   Right Unilateral             '[]'$  Bilateral   % Energy : 0.3 ms 100%   Impedance: 1490 ohms  Seizure Energy Index: 7576 V squared  Postictal Suppression Index: 47%  Seizure Concordance Index: 88%  Medications  Pre Shock: Lidocaine 40 mg Robinul 0.2 mg Toradol 30 mg ketamine 70 mg succinylcholine 60 mg  Post Shock: Versed 4 mg  Seizure Duration: 15 seconds EMG 37 seconds EEG   Comments: We are going to try seeing her again on Monday she continues to show improvement.  Lungs:  '[x]'$   Clear to auscultation               '[]'$  Other:   [name]:    '[x]'$   Regular rhythm             '[]'$  irregular rhythm    '[x]'$   Previous H&P reviewed, patient examined and there are NO CHANGES                 '[]'$   Previous H&P reviewed, patient examined and there are changes noted.   Alethia Berthold, MD 3/8/20244:13 PM

## 2022-10-28 NOTE — Progress Notes (Signed)
Of note:  heart rate on monitor: 40's, pulse ox heart rate 60-70's.  Apical with scope 50's. Patient without c/o's

## 2022-10-29 DIAGNOSIS — F332 Major depressive disorder, recurrent severe without psychotic features: Secondary | ICD-10-CM | POA: Diagnosis not present

## 2022-10-29 NOTE — [facility] Group Notes (Signed)
LCSW Group Therapy Note  10/29/2022 1:15pm  Type of Therapy/Topic:  Group Therapy:  Balance in Life  Participation Level:  Active  Description of Group:    This group will address the concept of balance and how it feels and looks when one is unbalanced. Patients will be encouraged to process areas in their lives that are out of balance and identify reasons for remaining unbalanced. Facilitators will guide patients in utilizing problem-solving interventions to address and correct the stressor making their life unbalanced. Understanding and applying boundaries will be explored and addressed for obtaining and maintaining a balanced life. Patients will be encouraged to explore ways to assertively make their unbalanced needs known to significant others in their lives, using other group members and facilitator for support and feedback.  Therapeutic Goals: Patient will identify two or more emotions or situations they have that consume much of in their lives. Patient will identify signs/triggers that life has become out of balance:  Patient will identify two ways to set boundaries in order to achieve balance in their lives:  Patient will demonstrate ability to communicate their needs through discussion and/or role plays  Summary of Patient Progress: Good participation.  Pt mainly shared how she has had to set boundaries to keep her tennis activities in balance, otherwise friends are asking her to play tennis all the time.  Pt seemed to resonate with example of church making a lot of demands on her time as well. Appropriate and attentive throughout.        Therapeutic Modalities:   Cognitive Behavioral Therapy Solution-Focused Therapy Assertiveness Training  Deirdre Evener 10/29/2022 2:29 PM

## 2022-10-29 NOTE — B[facility] Group Notes (Signed)
[facility] Group Notes:  (Nursing/MHT/Case Management/Adjunct)  Date:  10/29/2022  Time:  9:43 AM  Type of Therapy:   community meeting  Participation Level:  Active  Participation Quality:  Appropriate  Affect:  Appropriate  Cognitive:  Appropriate  Insight:  Appropriate  Engagement in Group:  Engaged  Modes of Intervention:  Activity and Discussion  Summary of Progress/Problems:  Antonieta Pert 10/29/2022, 9:43 AM

## 2022-10-29 NOTE — Progress Notes (Signed)
The patient was cooperative with treatment, She denies SI,HI & AVH. She seemed to have a good visit with her son on the shift, she spent most of the evening in the dayroom and she seemed to sleep well through out the night. No new issues to report on shift at this time.

## 2022-10-29 NOTE — Anesthesia Postprocedure Evaluation (Signed)
Anesthesia Post Note  Patient: Caitlyn [name]  Procedure(s) Performed: ECT TX  Patient location during evaluation: PACU Anesthesia Type: General Level of consciousness: awake and alert Pain management: pain level controlled Vital Signs Assessment: post-procedure vital signs reviewed and stable Respiratory status: spontaneous breathing, nonlabored ventilation and respiratory function stable Cardiovascular status: blood pressure returned to baseline and stable Postop Assessment: no apparent nausea or vomiting Anesthetic complications: no   No notable events documented.   Last Vitals:  Vitals:   10/29/22 1604 10/29/22 2013  BP: 132/74 130/75  Pulse: (!) 48 (!) 48  Resp:  18  Temp: 36.9 C 36.9 C  SpO2: 98% 96%    Last Pain:  Vitals:   10/29/22 2013  TempSrc: Oral  PainSc: 0-No pain                 [place] Ouch

## 2022-10-29 NOTE — Progress Notes (Signed)
[name] Progress Note  10/29/2022 11:18 AM Caitlyn [name]  MRN:  [id number] Subjective: Caitlyn [name] is seen on rounds.  She states that she is feeling better.  She is out of her room and in the day room and interacting with peers.  Pleasant cooperative and denies any side effects from her medicine.  ECT on Monday discharged Wednesday. Principal Problem: Major depressive disorder, recurrent severe without psychotic features ([place]) Diagnosis: Principal Problem:   Major depressive disorder, recurrent severe without psychotic features ([place])  Total Time spent with patient: 15 minutes  Past Psychiatric History: Depression  Past Medical History:  Past Medical History:  Diagnosis Date   Anxiety    Depression    Hypertension     Past Surgical History:  Procedure Laterality Date   EYE SURGERY     Family History:  Family History  Problem Relation Age of Onset   Breast cancer Neg Hx    Family Psychiatric  History: Unremarkable Social History:  Social History   Substance and Sexual Activity  Alcohol Use Never     Social History   Substance and Sexual Activity  Drug Use Never    Social History   Socioeconomic History   Marital status: Married    Spouse name: Not on file   Number of children: Not on file   Years of education: Not on file   Highest education level: Not on file  Occupational History   Not on file  Tobacco Use   Smoking status: Never   Smokeless tobacco: Never  Vaping Use   Vaping Use: Never used  Substance and Sexual Activity   Alcohol use: Never   Drug use: Never   Sexual activity: Not on file  Other Topics Concern   Not on file  Social History Narrative   Not on file   Social Determinants of Health   Financial Resource Strain: Not on file  Food Insecurity: No Food Insecurity (10/10/2022)   Hunger Vital Sign    Worried About Running Out of Food in the Last Year: Never true    Ran Out of Food in the Last Year: Never true  Transportation Needs: No  Transportation Needs (10/10/2022)   PRAPARE - Hydrologist (Medical): No    Lack of Transportation (Non-Medical): No  Physical Activity: Not on file  Stress: Not on file  Social Connections: Not on file   Additional Social History:                         Sleep: Good  Appetite:  Good  Current Medications: Current Facility-Administered Medications  Medication Dose Route Frequency Provider Last Rate Last Admin   0.9 %  sodium chloride infusion  500 mL Intravenous Once Clapacs, Madie Reno, MD       acetaminophen (TYLENOL) tablet 650 mg  650 mg Oral Q6H PRN Patrecia Pour, NP       alum & mag hydroxide-simeth (MAALOX/MYLANTA) 200-200-20 MG/5ML suspension 30 mL  30 mL Oral Q4H PRN Patrecia Pour, NP       buPROPion (WELLBUTRIN XL) 24 hr tablet 300 mg  300 mg Oral q morning Reita Cliche, Jamison Y, NP   300 mg at 10/29/22 0900   clonazePAM (KLONOPIN) tablet 0.5 mg  0.5 mg Oral QHS Clapacs, John T, MD   0.5 mg at 10/28/22 2124   escitalopram (LEXAPRO) tablet 20 mg  20 mg Oral QPC breakfast Parks Ranger, DO  20 mg at 10/29/22 0856   fentaNYL (SUBLIMAZE) injection 25 mcg  25 mcg Intravenous Q5 min PRN [place] Service, Jane Canary, MD       glycopyrrolate (ROBINUL) injection 0.2 mg  0.2 mg Intravenous Once Clapacs, Madie Reno, MD       glycopyrrolate (ROBINUL) injection 0.2 mg  0.2 mg Intravenous Once Clapacs, Madie Reno, MD       ketorolac (TORADOL) 30 MG/ML injection 30 mg  30 mg Intravenous Once Clapacs, Madie Reno, MD       ketorolac (TORADOL) 30 MG/ML injection 30 mg  30 mg Intravenous Once Clapacs, Madie Reno, MD       lactated ringers infusion   Intravenous Continuous Darrin Nipper, MD   New Bag at 10/26/22 1418   lisinopril (ZESTRIL) tablet 20 mg  20 mg Oral Daily Parks Ranger, DO   20 mg at 10/29/22 0900   LORazepam (ATIVAN) tablet 1 mg  1 mg Oral Q4H PRN Parks Ranger, DO       magnesium hydroxide (MILK OF MAGNESIA) suspension 30 mL  30 mL  Oral Daily PRN Patrecia Pour, NP       midazolam (VERSED) injection 4 mg  4 mg Intravenous Once Clapacs, Madie Reno, MD       midazolam (VERSED) injection 4 mg  4 mg Intravenous Once Clapacs, Madie Reno, MD       mirtazapine (REMERON) tablet 15 mg  15 mg Oral QHS Parks Ranger, DO   15 mg at 10/28/22 2124   multivitamin with minerals tablet 1 tablet  1 tablet Oral Daily Clapacs, Madie Reno, MD   1 tablet at 10/29/22 0900   OLANZapine (ZYPREXA) tablet 5 mg  5 mg Oral BH-q8a4p Parks Ranger, DO   5 mg at 10/29/22 0856   ondansetron (ZOFRAN) injection 4 mg  4 mg Intravenous Once PRN [place] Service, Jane Canary, MD       ondansetron [facility]) injection 4 mg  4 mg Intravenous Once PRN Arita Miss, MD       ondansetron (ZOFRAN-ODT) disintegrating tablet 4 mg  4 mg Oral Q8H PRN Parks Ranger, DO   4 mg at 10/17/22 1452   oxyCODONE (Oxy IR/ROXICODONE) immediate release tablet 5 mg  5 mg Oral Once PRN Darrin Nipper, MD       Or   oxyCODONE (ROXICODONE) 5 MG/5ML solution 5 mg  5 mg Oral Once PRN Darrin Nipper, MD       QUEtiapine (SEROQUEL) tablet 50 mg  50 mg Oral QHS Patrecia Pour, NP   50 mg at 10/28/22 2124    Lab Results:  Results for orders placed or performed during the hospital encounter of 10/10/22 (from the past 48 hour(s))  Glucose, capillary     Status: Abnormal   Collection Time: 10/28/22 10:36 AM  Result Value Ref Range   Glucose-Capillary 110 (H) 70 - 99 mg/dL    Comment: Glucose reference range applies only to samples taken after fasting for at least 8 hours.    Blood Alcohol level:  Lab Results  Component Value Date   ETH <10 10/09/2022   ETH <10 [id number]    Metabolic Disorder Labs: Lab Results  Component Value Date   HGBA1C 4.9 10/12/2022   MPG 93.93 10/12/2022   No results found for: "PROLACTIN" Lab Results  Component Value Date   CHOL 215 (H) 10/12/2022   TRIG 78 10/12/2022   HDL 87 10/12/2022   CHOLHDL 2.5 10/12/2022  VLDL 16  10/12/2022   LDLCALC 112 (H) 10/12/2022    Physical Findings: AIMS:  , ,  ,  ,    CIWA:    COWS:     Musculoskeletal: Strength & Muscle Tone: within normal limits Gait & Station: normal Patient leans: N/A  Psychiatric Specialty Exam:  Presentation  General Appearance:  Casual; Well Groomed  Eye Contact: Good  Speech: Clear and Coherent; Normal Rate  Speech Volume: Normal  Handedness: Right   Mood and Affect  Mood: Anxious; Depressed; Hopeless  Affect: Congruent; Depressed   Thought Process  Thought Processes: Coherent; Goal Directed; Linear  Descriptions of Associations:Intact  Orientation:Full (Time, Place and Person)  Thought Content:Logical  History of Schizophrenia/Schizoaffective disorder:No data recorded Duration of Psychotic Symptoms:No data recorded Hallucinations:No data recorded Ideas of Reference:None  Suicidal Thoughts:No data recorded Homicidal Thoughts:No data recorded  Sensorium  Memory: Immediate Good; Recent Good; Remote Good  Judgment: Impaired  Insight: Fair   Community education officer  Concentration: Good  Attention Span: Good  Recall: Good  Fund of Knowledge: Good  Language: Good   Psychomotor Activity  Psychomotor Activity:No data recorded  Assets  Assets: Communication Skills; Desire for Improvement; Financial Resources/Insurance; Housing; Intimacy; Physical Health   Sleep  Sleep:No data recorded   Blood pressure 132/69, pulse (!) 50, temperature 98.7 F (37.1 C), temperature source Oral, resp. rate 20, height '5\' 2"'$  (1.575 m), weight 49.4 kg, SpO2 96 %. Body mass index is 19.94 kg/m.   Treatment Plan Summary: Daily contact with patient to assess and evaluate symptoms and progress in treatment, Medication management, and Plan continue current medications.  [place], DO 10/29/2022, 11:18 AM

## 2022-10-29 NOTE — Plan of Care (Signed)
Pt endorses anxiety/depression at this time however mood/affect presents as improved and brighter. Pt has had a lot of conversation with staff about her days of running marathons with her spouse, her experiences, and time in high school and college. Pt can be observed smiling and interacting with not only other patients but staff as well. Pt attended all groups today. Pt denies SI/HI/AVH or pain at this time. Pt is calm and cooperative. Pt is medication compliant. Pt provided with support and encouragement. Pt monitored q15 minutes for safety per unit policy. Plan of care ongoing.   Problem: Education: Goal: Knowledge of General Education information will improve Description: Including pain rating scale, medication(s)/side effects and non-pharmacologic comfort measures Outcome: Progressing   Problem: Nutrition: Goal: Adequate nutrition will be maintained Outcome: Progressing

## 2022-10-30 DIAGNOSIS — F332 Major depressive disorder, recurrent severe without psychotic features: Secondary | ICD-10-CM | POA: Diagnosis not present

## 2022-10-30 NOTE — Progress Notes (Signed)
[facility] MD Progress Note  10/30/2022 11:16 AM Caitlyn [name]  MRN:  [id number] Subjective: Caitlyn [name] is seen on rounds.  Her mood is improving.  Her affect is improving.  She says that she is doing better.  She is sleeping well and her appetite is good.  She has ECT tomorrow and plan on discharge on Wednesday. Principal Problem: Major depressive disorder, recurrent severe without psychotic features ([place]) Diagnosis: Principal Problem:   Major depressive disorder, recurrent severe without psychotic features ([place])  Total Time spent with patient: 15 minutes  Past Psychiatric History: Depression  Past Medical History:  Past Medical History:  Diagnosis Date   Anxiety    Depression    Hypertension     Past Surgical History:  Procedure Laterality Date   EYE SURGERY     Family History:  Family History  Problem Relation Age of Onset   Breast cancer Neg Hx    Family Psychiatric  History: Unremarkable Social History:  Social History   Substance and Sexual Activity  Alcohol Use Never     Social History   Substance and Sexual Activity  Drug Use Never    Social History   Socioeconomic History   Marital status: Married    Spouse name: Not on file   Number of children: Not on file   Years of education: Not on file   Highest education level: Not on file  Occupational History   Not on file  Tobacco Use   Smoking status: Never   Smokeless tobacco: Never  Vaping Use   Vaping Use: Never used  Substance and Sexual Activity   Alcohol use: Never   Drug use: Never   Sexual activity: Not on file  Other Topics Concern   Not on file  Social History Narrative   Not on file   Social Determinants of Health   Financial Resource Strain: Not on file  Food Insecurity: No Food Insecurity (10/10/2022)   Hunger Vital Sign    Worried About Running Out of Food in the Last Year: Never true    Ran Out of Food in the Last Year: Never true  Transportation Needs: No Transportation Needs (10/10/2022)    PRAPARE - Hydrologist (Medical): No    Lack of Transportation (Non-Medical): No  Physical Activity: Not on file  Stress: Not on file  Social Connections: Not on file   Additional Social History:                         Sleep: Good  Appetite:  Good  Current Medications: Current Facility-Administered Medications  Medication Dose Route Frequency Provider Last Rate Last Admin   0.9 %  sodium chloride infusion  500 mL Intravenous Once Clapacs, Madie Reno, MD       acetaminophen (TYLENOL) tablet 650 mg  650 mg Oral Q6H PRN Patrecia Pour, NP       alum & mag hydroxide-simeth (MAALOX/MYLANTA) 200-200-20 MG/5ML suspension 30 mL  30 mL Oral Q4H PRN Patrecia Pour, NP       buPROPion (WELLBUTRIN XL) 24 hr tablet 300 mg  300 mg Oral q morning Reita Cliche, Jamison Y, NP   300 mg at 10/30/22 0900   clonazePAM (KLONOPIN) tablet 0.5 mg  0.5 mg Oral QHS Clapacs, John T, MD   0.5 mg at 10/29/22 2130   escitalopram (LEXAPRO) tablet 20 mg  20 mg Oral QPC breakfast Parks Ranger, DO   20 mg  at 10/30/22 0853   fentaNYL (SUBLIMAZE) injection 25 mcg  25 mcg Intravenous Q5 min PRN [place] Service, Jane Canary, MD       glycopyrrolate (ROBINUL) injection 0.2 mg  0.2 mg Intravenous Once Clapacs, Madie Reno, MD       glycopyrrolate (ROBINUL) injection 0.2 mg  0.2 mg Intravenous Once Clapacs, Madie Reno, MD       ketorolac (TORADOL) 30 MG/ML injection 30 mg  30 mg Intravenous Once Clapacs, Madie Reno, MD       ketorolac (TORADOL) 30 MG/ML injection 30 mg  30 mg Intravenous Once Clapacs, Madie Reno, MD       lactated ringers infusion   Intravenous Continuous Darrin Nipper, MD   New Bag at 10/26/22 1418   lisinopril (ZESTRIL) tablet 20 mg  20 mg Oral Daily Parks Ranger, DO   20 mg at 10/30/22 0900   LORazepam (ATIVAN) tablet 1 mg  1 mg Oral Q4H PRN Parks Ranger, DO       magnesium hydroxide (MILK OF MAGNESIA) suspension 30 mL  30 mL Oral Daily PRN Patrecia Pour, NP        midazolam (VERSED) injection 4 mg  4 mg Intravenous Once Clapacs, Madie Reno, MD       midazolam (VERSED) injection 4 mg  4 mg Intravenous Once Clapacs, Madie Reno, MD       mirtazapine (REMERON) tablet 15 mg  15 mg Oral QHS Parks Ranger, DO   15 mg at 10/29/22 2131   multivitamin with minerals tablet 1 tablet  1 tablet Oral Daily Clapacs, Madie Reno, MD   1 tablet at 10/30/22 0900   OLANZapine (ZYPREXA) tablet 5 mg  5 mg Oral BH-q8a4p Parks Ranger, DO   5 mg at 10/30/22 0853   ondansetron (ZOFRAN) injection 4 mg  4 mg Intravenous Once PRN [place] Service, Jane Canary, MD       ondansetron [facility]) injection 4 mg  4 mg Intravenous Once PRN Arita Miss, MD       ondansetron (ZOFRAN-ODT) disintegrating tablet 4 mg  4 mg Oral Q8H PRN Parks Ranger, DO   4 mg at 10/17/22 1452   oxyCODONE (Oxy IR/ROXICODONE) immediate release tablet 5 mg  5 mg Oral Once PRN Darrin Nipper, MD       Or   oxyCODONE (ROXICODONE) 5 MG/5ML solution 5 mg  5 mg Oral Once PRN Darrin Nipper, MD       QUEtiapine (SEROQUEL) tablet 50 mg  50 mg Oral QHS Patrecia Pour, NP   50 mg at 10/29/22 2130    Lab Results:  Results for orders placed or performed during the hospital encounter of 10/10/22 (from the past 48 hour(s))  Glucose, capillary     Status: Abnormal   Collection Time: 10/28/22 10:36 AM  Result Value Ref Range   Glucose-Capillary 110 (H) 70 - 99 mg/dL    Comment: Glucose reference range applies only to samples taken after fasting for at least 8 hours.    Blood Alcohol level:  Lab Results  Component Value Date   ETH <10 10/09/2022   ETH <10 [id number]    Metabolic Disorder Labs: Lab Results  Component Value Date   HGBA1C 4.9 10/12/2022   MPG 93.93 10/12/2022   No results found for: "PROLACTIN" Lab Results  Component Value Date   CHOL 215 (H) 10/12/2022   TRIG 78 10/12/2022   HDL 87 10/12/2022   CHOLHDL 2.5 10/12/2022   VLDL  16 10/12/2022   LDLCALC 112 (H) 10/12/2022     Physical Findings: AIMS:  , ,  ,  ,    CIWA:    COWS:     Musculoskeletal: Strength & Muscle Tone: within normal limits Gait & Station: normal Patient leans: N/A  Psychiatric Specialty Exam:  Presentation  General Appearance:  Casual; Well Groomed  Eye Contact: Good  Speech: Clear and Coherent; Normal Rate  Speech Volume: Normal  Handedness: Right   Mood and Affect  Mood: Anxious; Depressed; Hopeless  Affect: Congruent; Depressed   Thought Process  Thought Processes: Coherent; Goal Directed; Linear  Descriptions of Associations:Intact  Orientation:Full (Time, Place and Person)  Thought Content:Logical  History of Schizophrenia/Schizoaffective disorder:No data recorded Duration of Psychotic Symptoms:No data recorded Hallucinations:No data recorded Ideas of Reference:None  Suicidal Thoughts:No data recorded Homicidal Thoughts:No data recorded  Sensorium  Memory: Immediate Good; Recent Good; Remote Good  Judgment: Impaired  Insight: Fair   Community education officer  Concentration: Good  Attention Span: Good  Recall: Good  Fund of Knowledge: Good  Language: Good   Psychomotor Activity  Psychomotor Activity:No data recorded  Assets  Assets: Communication Skills; Desire for Improvement; Financial Resources/Insurance; Housing; Intimacy; Physical Health   Sleep  Sleep:No data recorded    Blood pressure (!) 140/92, pulse (!) 53, temperature 97.7 F (36.5 C), resp. rate 17, height '5\' 2"'$  (1.575 m), weight 49.4 kg, SpO2 94 %. Body mass index is 19.94 kg/m.   Treatment Plan Summary: Daily contact with patient to assess and evaluate symptoms and progress in treatment, Medication management, and Plan continue current medications.  [place] Ranger, DO 10/30/2022, 11:16 AM

## 2022-10-30 NOTE — Group Note (Signed)
Date:  10/30/2022 Time:  11:30 AM  Group Topic/Focus:  Personal Choices and Values:   The focus of this group is to help patients assess and explore the importance of values in their lives, how their values affect their decisions, how they express their values and what opposes their expression.    Participation Level:  Active  Participation Quality:  Appropriate and Attentive  Affect:  Appropriate  Cognitive:  Alert and Appropriate  Insight: Appropriate  Engagement in Group:  Engaged  Modes of Intervention:  Activity, Discussion, and Rapport Building  Additional Comments:  Patient attended: activity on past activities that patient enjoyed and talked about positive feelings those activities gave to the patient. Reflection on the importance of positive, personal choices.  Gennie Eisinger C Oather Mu[place] 10/30/2022, 11:30 AM

## 2022-10-30 NOTE — Plan of Care (Signed)
  Problem: Activity: Goal: Risk for activity intolerance will decrease Outcome: Progressing   Problem: Coping: Goal: Level of anxiety will decrease Outcome: Progressing   Problem: Safety: Goal: Ability to remain free from injury will improve Outcome: Progressing   

## 2022-10-30 NOTE — Progress Notes (Signed)
D- Patient alert and oriented x 4. Patient in a pleasant mood and affect. Patient out of her room without prompting. Patient engaging with others in the dayroom. Patient denies SI, HI, AVH, pain, anxiety, and depression. Patient states that her goal is to continue to eat better. Patient states she will try to eat 10% more today than she did yesterday.   A- Scheduled medications administered to patient, per MD orders. Support and encouragement provided.  Routine safety checks conducted every 15 minutes.  Patient informed to notify staff with problems or concerns.  R- No adverse drug reactions noted. Patient contracts for safety at this time. Patient compliant with medications and treatment plan. Patient receptive, calm, and cooperative. Patient interacts well with others on the unit.  Patient remains safe at this time.   10/30/22 0900  Charting Type  Charting Type Shift assessment  Safety Check Verification  Has the RN verified the 15 minute safety check completion? Yes  Neurological  Neuro (WDL) WDL  Orientation Level Oriented X4  Cognition Appropriate at baseline  Speech Clear  HEENT  HEENT (WDL) WDL  Lips Symmetrical  Respiratory  Respiratory (WDL) WDL  Cough None  Respiratory Pattern Regular;Unlabored  Chest Assessment Chest expansion symmetrical  Cardiac  Cardiac (WDL) WDL  Vascular  Vascular (WDL) WDL  Integumentary  Integumentary (WDL) WDL  Skin Color Appropriate for ethnicity  Braden Scale (Ages 8 and up)  Sensory Perceptions 4  Moisture 4  Activity 3  Mobility 4  Nutrition 3  Friction and Shear 3  Braden Scale Score 21  Musculoskeletal  Musculoskeletal (WDL) WDL  Assistive Device None  Gastrointestinal  Gastrointestinal (WDL) WDL  GU Assessment  Genitourinary (WDL) WDL  Neurological  Level of Consciousness Alert

## 2022-10-30 NOTE — B[facility] Group Notes (Signed)
LCSW Wellness Group Note   10/30/2022 1:45pm  Type of Group and Topic: Psychoeducational Group:  Wellness  Participation Level:  Active  Description of Group  Wellness group introduces the topic and its focus on developing healthy habits across the spectrum and its relationship to a decrease in hospital admissions.  Six areas of wellness are discussed: physical, social spiritual, intellectual, occupational, and emotional.  Patients are asked to consider their current wellness habits and to identify areas of wellness where they are interested and able to focus on improvements.    Therapeutic Goals Patients will understand components of wellness and how they can positively impact overall health.  Patients will identify areas of wellness where they have developed good habits. Patients will identify areas of wellness where they would like to make improvements.    Summary of Patient Progress: good participation, active in discussion. Pt identified financial and emotional as areas of strength and social as an area that needs some work. Pt shared that she and her husband are new to the area after40 years in [place] and have not made a lot of social connections yet.      Therapeutic Modalities: Cognitive Behavioral Therapy Psychoeducation    Joanne Chars, LCSW

## 2022-10-30 NOTE — Plan of Care (Signed)
  Problem: Education: Goal: Knowledge of General Education information will improve Description: Including pain rating scale, medication(s)/side effects and non-pharmacologic comfort measures Outcome: Progressing   Problem: Health Behavior/Discharge Planning: Goal: Ability to manage health-related needs will improve Outcome: Progressing   Problem: Clinical Measurements: Goal: Will remain free from infection Outcome: Progressing Goal: Diagnostic test results will improve Outcome: Progressing Goal: Respiratory complications will improve Outcome: Progressing Goal: Cardiovascular complication will be avoided Outcome: Progressing   Problem: Activity: Goal: Risk for activity intolerance will decrease Outcome: Progressing   Problem: Nutrition: Goal: Adequate nutrition will be maintained Outcome: Progressing   

## 2022-10-30 NOTE — Plan of Care (Signed)
Pt was in the dayroom at beginning of shift visiting with her son. Stated the visit went well and son also stated she has improved since she's been doing ECT. She was compliant with her medications and has since been in her room sleeping. No behavioral issues noted this shift.  Problem: Nutrition: Goal: Adequate nutrition will be maintained Outcome: Progressing   Problem: Coping: Goal: Level of anxiety will decrease Outcome: Progressing   Problem: Safety: Goal: Ability to remain free from injury will improve Outcome: Progressing

## 2022-10-30 NOTE — Group Note (Signed)
Date:  10/30/2022 Time:  11:25 AM  Group Topic/Focus:  Community Meeting     Participation Level:  Active  Participation Quality:  Appropriate and Attentive  Affect:  Appropriate  Cognitive:  Alert  Insight: Appropriate and Good  Engagement in Group:  Engaged  Modes of Intervention:  Clarification  Additional Comments:  Community meeting on unit expectations  Luis Abed 10/30/2022, 11:25 AM

## 2022-10-31 ENCOUNTER — Inpatient Hospital Stay: Payer: Medicare Other | Admitting: Certified Registered"

## 2022-10-31 ENCOUNTER — Ambulatory Visit: Payer: Medicare Other

## 2022-10-31 ENCOUNTER — Other Ambulatory Visit: Payer: Self-pay | Admitting: Psychiatry

## 2022-10-31 ENCOUNTER — Encounter: Payer: Self-pay | Admitting: Psychiatry

## 2022-10-31 MED ORDER — LABETALOL HCL 5 MG/ML IV SOLN
INTRAVENOUS | Status: DC | PRN
  Administered 2022-10-31 (×2): 5 mg via INTRAVENOUS

## 2022-10-31 MED ORDER — SODIUM CHLORIDE 0.9 % IV SOLN: INTRAVENOUS | Status: DC | PRN

## 2022-10-31 MED ORDER — MIDAZOLAM HCL 2 MG/2ML IJ SOLN
INTRAMUSCULAR | Status: AC
  Filled 2022-10-31: qty 2

## 2022-10-31 MED ORDER — KETAMINE HCL 10 MG/ML IJ SOLN
INTRAMUSCULAR | Status: DC | PRN
  Administered 2022-10-31: 70 mg via INTRAVENOUS

## 2022-10-31 MED ORDER — MIDAZOLAM HCL 5 MG/5ML IJ SOLN
INTRAMUSCULAR | Status: DC | PRN
  Administered 2022-10-31: 4 mg via INTRAVENOUS

## 2022-10-31 MED ORDER — KETOROLAC TROMETHAMINE 30 MG/ML IJ SOLN
30.0000 mg | Freq: Once | INTRAMUSCULAR | Status: AC
  Administered 2022-10-31: 30 mg via INTRAVENOUS

## 2022-10-31 MED ORDER — FENTANYL CITRATE PF 50 MCG/ML IJ SOSY: 25.0000 ug | PREFILLED_SYRINGE | INTRAMUSCULAR | Status: DC | PRN

## 2022-10-31 MED ORDER — SODIUM CHLORIDE 0.9 % IV SOLN: 500.0000 mL | Freq: Once | INTRAVENOUS | Status: DC

## 2022-10-31 MED ORDER — SUCCINYLCHOLINE CHLORIDE 200 MG/10ML IV SOSY
PREFILLED_SYRINGE | INTRAVENOUS | Status: DC | PRN
  Administered 2022-10-31: 60 mg via INTRAVENOUS

## 2022-10-31 MED ORDER — GLYCOPYRROLATE 0.2 MG/ML IJ SOLN
0.2000 mg | Freq: Once | INTRAMUSCULAR | Status: AC
  Administered 2022-10-31: 0.2 mg via INTRAVENOUS

## 2022-10-31 MED ORDER — MIDAZOLAM HCL 2 MG/2ML IJ SOLN: 4.0000 mg | Freq: Once | INTRAMUSCULAR | Status: AC

## 2022-10-31 MED ORDER — KETAMINE HCL 50 MG/5ML IJ SOSY
PREFILLED_SYRINGE | INTRAMUSCULAR | Status: AC
  Filled 2022-10-31: qty 5

## 2022-10-31 MED ORDER — PROPOFOL 10 MG/ML IV BOLUS
INTRAVENOUS | Status: AC
  Filled 2022-10-31: qty 20

## 2022-10-31 MED ORDER — LIDOCAINE HCL (CARDIAC) PF 100 MG/5ML IV SOSY
PREFILLED_SYRINGE | INTRAVENOUS | Status: DC | PRN
  Administered 2022-10-31: 20 mg via INTRAVENOUS

## 2022-10-31 NOTE — Progress Notes (Signed)
   10/31/22 0923  Psych Admission Type (Psych Patients Only)  Admission Status Voluntary  Psychosocial Assessment  Patient Complaints None  Eye Contact Fair  Facial Expression Grimacing  Affect Appropriate to circumstance  Speech Logical/coherent  Interaction Assertive  Motor Activity Other (Comment)  Appearance/Hygiene Unremarkable  Behavior Characteristics Cooperative  Mood Pleasant  Thought Process  Coherency WDL  Content WDL  Delusions None reported or observed  Perception WDL  Hallucination None reported or observed  Judgment WDL  Confusion None  Danger to Self  Current suicidal ideation? Denies  Danger to Others  Danger to Others None reported or observed

## 2022-10-31 NOTE — Anesthesia Postprocedure Evaluation (Signed)
Anesthesia Post Note  Patient: Caitlyn [name]  Procedure(s) Performed: ECT TX  Patient location during evaluation: PACU Anesthesia Type: General Level of consciousness: sedated Pain management: pain level controlled Vital Signs Assessment: post-procedure vital signs reviewed and stable Respiratory status: spontaneous breathing and respiratory function stable Cardiovascular status: stable Anesthetic complications: no   No notable events documented.   Last Vitals:  Vitals:   10/31/22 1332 10/31/22 1334  BP: (!) 161/81   Pulse: (!) 41 (!) 48  Resp: 18   Temp: 36.9 C   SpO2: 90% 96%    Last Pain:  Vitals:   10/31/22 1332  TempSrc: Oral  PainSc:                  VAN STAVEREN,Brenner Visconti

## 2022-10-31 NOTE — Progress Notes (Signed)
Pt was out in the dayroom at beginning of the shift, her affect seems bright and she was smiling and talking with other patients. She was compliant with her scheduled medications and voiced understanding that she wasn't supposed to have anything to eat or drink after midnight. She has appeared to be sleeping rest of shift. No behavioral issues noted.

## 2022-10-31 NOTE — H&P (Signed)
Caitlyn [name] is an 77 y.o. female.   Chief Complaint: Patient has no specific complaint. HPI: Severe depression appears to be greatly improved now.  She has some memory problems making it hard for her to compare but she seems dramatically different and is no longer reporting any suicidal thoughts  Past Medical History:  Diagnosis Date   Anxiety    Depression    Hypertension     Past Surgical History:  Procedure Laterality Date   EYE SURGERY      Family History  Problem Relation Age of Onset   Breast cancer Neg Hx    Social History:  reports that she has never smoked. She has never used smokeless tobacco. She reports that she does not drink alcohol and does not use drugs.  Allergies:  Allergies  Allergen Reactions   Penicillins     Medications Prior to Admission  Medication Sig Dispense Refill   buPROPion (WELLBUTRIN XL) 300 MG 24 hr tablet Take 300 mg by mouth every morning.     cycloSPORINE (RESTASIS) 0.05 % ophthalmic emulsion Place 1 drop into both eyes daily.     FLUoxetine (PROZAC) 20 MG capsule Take 20-40 mg by mouth See admin instructions. Takes 20 mg and 40 mg on alternate days.     lisinopril (ZESTRIL) 10 MG tablet Take 10 mg by mouth daily.     QUEtiapine (SEROQUEL) 50 MG tablet Take 50 mg by mouth at bedtime.      No results found for this or any previous visit (from the past 48 hour(s)). No results found.  Review of Systems  Constitutional: Negative.   HENT: Negative.    Eyes: Negative.   Respiratory: Negative.    Cardiovascular: Negative.   Gastrointestinal: Negative.   Musculoskeletal: Negative.   Skin: Negative.   Neurological: Negative.   Psychiatric/Behavioral: Negative.      Blood pressure (!) 161/81, pulse (!) 48, temperature 98.4 F (36.9 C), temperature source Oral, resp. rate 18, height '5\' 2"'$  (1.575 m), weight 49.4 kg, SpO2 96 %. Physical Exam Vitals reviewed.  Constitutional:      Appearance: She is well-developed.  HENT:     Head:  Normocephalic and atraumatic.  Eyes:     Conjunctiva/sclera: Conjunctivae normal.     Pupils: Pupils are equal, round, and reactive to light.  Cardiovascular:     Heart sounds: Normal heart sounds.  Pulmonary:     Effort: Pulmonary effort is normal.  Abdominal:     Palpations: Abdomen is soft.  Musculoskeletal:        General: Normal range of motion.     Cervical back: Normal range of motion.  Skin:    General: Skin is warm and dry.  Neurological:     General: No focal deficit present.     Mental Status: She is alert.  Psychiatric:        Mood and Affect: Mood normal.        Thought Content: Thought content normal.      Assessment/Plan Treatment today.  Ideally I would probably like to see her again on Wednesday as she continues to show some improvement.  We will follow-up with her tomorrow.  Likely discharge Wednesday.  Alethia Berthold, MD 10/31/2022, 4:25 PM

## 2022-10-31 NOTE — Progress Notes (Signed)
Patient is alert and oriented times 4. Mood and affect anxious and sullen. Patient denies pain. She denies SI, HI, and AVH. Also denies feelings of anxiety and depression at this time. States she slept good last night- woke up thinking it was still night time-patient easy to redirect. Morning lisinopril given whole by mouth W/O difficulty. Patient remains NPO for ECT. Patient remains on unit with Q15 minute checks in place.

## 2022-10-31 NOTE — Progress Notes (Signed)
[facility] MD Progress Note  10/31/2022 12:48 PM Caitlyn [name]  MRN:  [id number] Subjective: Caitlyn [name] is seen on rounds this morning.  She has ECT today.  She is anxious about it.  This will be her sixth and last ECT treatment.  We are planning on discharging on Wednesday.  She has been compliant with medications and denies any side effects.  She states that she slept well.  Her affect is improving. Principal Problem: Major depressive disorder, recurrent severe without psychotic features ([place]) Diagnosis: Principal Problem:   Major depressive disorder, recurrent severe without psychotic features ([place])  Total Time spent with patient: 15 minutes  Past Psychiatric History: Long history of depression and anxiety  Past Medical History:  Past Medical History:  Diagnosis Date   Anxiety    Depression    Hypertension     Past Surgical History:  Procedure Laterality Date   EYE SURGERY     Family History:  Family History  Problem Relation Age of Onset   Breast cancer Neg Hx    Family Psychiatric  History: Unremarkable Social History:  Social History   Substance and Sexual Activity  Alcohol Use Never     Social History   Substance and Sexual Activity  Drug Use Never    Social History   Socioeconomic History   Marital status: Married    Spouse name: Not on file   Number of children: Not on file   Years of education: Not on file   Highest education level: Not on file  Occupational History   Not on file  Tobacco Use   Smoking status: Never   Smokeless tobacco: Never  Vaping Use   Vaping Use: Never used  Substance and Sexual Activity   Alcohol use: Never   Drug use: Never   Sexual activity: Not on file  Other Topics Concern   Not on file  Social History Narrative   Not on file   Social Determinants of Health   Financial Resource Strain: Not on file  Food Insecurity: No Food Insecurity (10/10/2022)   Hunger Vital Sign    Worried About Running Out of Food in the Last Year: Never  true    Ran Out of Food in the Last Year: Never true  Transportation Needs: No Transportation Needs (10/10/2022)   PRAPARE - Hydrologist (Medical): No    Lack of Transportation (Non-Medical): No  Physical Activity: Not on file  Stress: Not on file  Social Connections: Not on file   Additional Social History:                         Sleep: Good  Appetite:  Good  Current Medications: Current Facility-Administered Medications  Medication Dose Route Frequency Provider Last Rate Last Admin   0.9 %  sodium chloride infusion  500 mL Intravenous Once Clapacs, John T, MD       0.9 %  sodium chloride infusion  500 mL Intravenous Once Clapacs, Madie Reno, MD       acetaminophen (TYLENOL) tablet 650 mg  650 mg Oral Q6H PRN Patrecia Pour, NP       alum & mag hydroxide-simeth (MAALOX/MYLANTA) 200-200-20 MG/5ML suspension 30 mL  30 mL Oral Q4H PRN Patrecia Pour, NP       buPROPion (WELLBUTRIN XL) 24 hr tablet 300 mg  300 mg Oral q morning Patrecia Pour, NP   300 mg at 10/30/22 0900  clonazePAM (KLONOPIN) tablet 0.5 mg  0.5 mg Oral QHS Clapacs, John T, MD   0.5 mg at 10/30/22 2109   escitalopram (LEXAPRO) tablet 20 mg  20 mg Oral QPC breakfast Parks Ranger, DO   20 mg at 10/30/22 0853   fentaNYL (SUBLIMAZE) injection 25 mcg  25 mcg Intravenous Q5 min PRN [place] [employer], Jane Canary, MD       lactated ringers infusion   Intravenous Continuous Darrin Nipper, MD   New Bag at 10/26/22 1418   lisinopril (ZESTRIL) tablet 20 mg  20 mg Oral Daily Parks Ranger, DO   20 mg at 10/31/22 0955   LORazepam (ATIVAN) tablet 1 mg  1 mg Oral Q4H PRN Parks Ranger, DO       magnesium hydroxide (MILK OF MAGNESIA) suspension 30 mL  30 mL Oral Daily PRN Patrecia Pour, NP       midazolam (VERSED) injection 4 mg  4 mg Intravenous Once Clapacs, John T, MD       mirtazapine (REMERON) tablet 15 mg  15 mg Oral QHS Parks Ranger, DO   15 mg at  10/30/22 2111   multivitamin with minerals tablet 1 tablet  1 tablet Oral Daily Clapacs, John T, MD   1 tablet at 10/30/22 0900   OLANZapine (ZYPREXA) tablet 5 mg  5 mg Oral [id number] Parks Ranger, DO   5 mg at 10/30/22 1619   oxyCODONE (Oxy IR/ROXICODONE) immediate release tablet 5 mg  5 mg Oral Once PRN Darrin Nipper, MD       Or   oxyCODONE (ROXICODONE) 5 MG/5ML solution 5 mg  5 mg Oral Once PRN Darrin Nipper, MD       QUEtiapine (SEROQUEL) tablet 50 mg  50 mg Oral QHS Patrecia Pour, NP   50 mg at 10/30/22 2109    Lab Results: No results found for this or any previous visit (from the past 48 hour(s)).  Blood Alcohol level:  Lab Results  Component Value Date   ETH <10 10/09/2022   ETH <10 [id number]    Metabolic Disorder Labs: Lab Results  Component Value Date   HGBA1C 4.9 10/12/2022   MPG 93.93 10/12/2022   No results found for: "PROLACTIN" Lab Results  Component Value Date   CHOL 215 (H) 10/12/2022   TRIG 78 10/12/2022   HDL 87 10/12/2022   CHOLHDL 2.5 10/12/2022   VLDL 16 10/12/2022   LDLCALC 112 (H) 10/12/2022    Physical Findings: AIMS:  , ,  ,  ,    CIWA:    COWS:     Musculoskeletal: Strength & Muscle Tone: within normal limits Gait & Station: normal Patient leans: N/A  Psychiatric Specialty Exam:  Presentation  General Appearance:  Casual; Well Groomed  Eye Contact: Good  Speech: Clear and Coherent; Normal Rate  Speech Volume: Normal  Handedness: Right   Mood and Affect  Mood: Anxious; Depressed; Hopeless  Affect: Congruent; Depressed   Thought Process  Thought Processes: Coherent; Goal Directed; Linear  Descriptions of Associations:Intact  Orientation:Full (Time, Place and Person)  Thought Content:Logical  History of Schizophrenia/Schizoaffective disorder:No data recorded Duration of Psychotic Symptoms:No data recorded Hallucinations:No data recorded Ideas of Reference:None  Suicidal Thoughts:No data  recorded Homicidal Thoughts:No data recorded  Sensorium  Memory: Immediate Good; Recent Good; Remote Good  Judgment: Impaired  Insight: Fair   Community education officer  Concentration: Good  Attention Span: Good  Recall: Good  Fund of Knowledge: Good  Language: Good  Psychomotor Activity  Psychomotor Activity:No data recorded  Assets  Assets: Communication Skills; Desire for Improvement; Financial Resources/Insurance; Housing; Intimacy; Physical Health   Sleep  Sleep:No data recorded   Physical Exam: Physical Exam Vitals and nursing note reviewed.  Constitutional:      Appearance: Normal appearance. She is normal weight.  Neurological:     General: No focal deficit present.     Mental Status: She is alert and oriented to person, place, and time.  Psychiatric:        Attention and Perception: Attention and perception normal.        Mood and Affect: Mood is depressed. Affect is flat.        Speech: Speech normal.        Behavior: Behavior is withdrawn. Behavior is cooperative.        Cognition and Memory: Cognition and memory normal.        Judgment: Judgment normal.    Review of Systems  Constitutional: Negative.   HENT: Negative.    Eyes: Negative.   Respiratory: Negative.    Cardiovascular: Negative.   Gastrointestinal: Negative.   Genitourinary: Negative.   Musculoskeletal: Negative.   Skin: Negative.   Neurological: Negative.   Endo/Heme/Allergies: Negative.   Psychiatric/Behavioral:  Positive for depression.    Blood pressure (!) 145/82, pulse 72, temperature 98.2 F (36.8 C), temperature source Oral, resp. rate 18, height '5\' 2"'$  (1.575 m), weight 49.4 kg, SpO2 98 %. Body mass index is 19.94 kg/m.   Treatment Plan Summary: Daily contact with patient to assess and evaluate symptoms and progress in treatment, Medication management, and Plan continue current medications.  Parks Ranger, DO 10/31/2022, 12:48 PM

## 2022-10-31 NOTE — Procedures (Signed)
ECT SERVICES Physician's Interval Evaluation & Treatment Note  Patient Identification: Caitlyn [name] MRN:  [id number] Date of Evaluation:  10/31/2022 TX #: [id number]  MADRS:   MMSE:   P.E. Findings:  No change to physical exam  Psychiatric Interval Note:  Some memory problems over the course of the hospital stay but mood and anxiety clearly improved  Subjective:  Patient is a 77 y.o. female seen for evaluation for Electroconvulsive Therapy. No specific complaint  Treatment Summary:   '[x]'$   Right Unilateral             '[]'$  Bilateral   % Energy : 0.3 ms 100%   Impedance: 2150 ohms  Seizure Energy Index: No reading  Postictal Suppression Index: No reading  Seizure Concordance Index: 91%  Medications  Pre Shock: Robinul 0.2 mg Toradol 30 mg ketamine 70 mg succinylcholine 60 mg  Post Shock: Versed 4 mg  Seizure Duration: EMG 15 seconds EEG 28 seconds   Comments: Good quality seizure despite the computer having trouble reading it.  Patient is doing well.  Anticipate discharge Wednesday possibly with another treatment before then  Lungs:  '[x]'$   Clear to auscultation               '[]'$  Other:   Heart:    '[x]'$   Regular rhythm             '[]'$  irregular rhythm    '[x]'$   Previous H&P reviewed, patient examined and there are NO CHANGES                 '[]'$   Previous H&P reviewed, patient examined and there are changes noted.   Alethia Berthold, MD 3/11/20244:26 PM

## 2022-10-31 NOTE — Progress Notes (Signed)
Patient returned from ECT. HR fluctuation, patient in no distress. She complains of nausea. This Probation officer gave a cup of gingerale. Will continue to monitor for changes.

## 2022-10-31 NOTE — Group Note (Signed)
[facility] LCSW Group Therapy Note    Group Date: 10/31/2022 Start Time: 1305 End Time: 1355  Type of Therapy and Topic:  Group Therapy:  Overcoming Obstacles  Participation Level:  [facility] PARTICIPATION LEVEL: Did Not Attend  Mood:  Description of Group:   In this group patients will be encouraged to explore what they see as obstacles to their own wellness and recovery. They will be guided to discuss their thoughts, feelings, and behaviors related to these obstacles. The group will process together ways to cope with barriers, with attention given to specific choices patients can make. Each patient will be challenged to identify changes they are motivated to make in order to overcome their obstacles. This group will be process-oriented, with patients participating in exploration of their own experiences as well as giving and receiving support and challenge from other group members.  Therapeutic Goals: 1. Patient will identify personal and current obstacles as they relate to admission. 2. Patient will identify barriers that currently interfere with their wellness or overcoming obstacles.  3. Patient will identify feelings, thought process and behaviors related to these barriers. 4. Patient will identify two changes they are willing to make to overcome these obstacles:    Summary of Patient Progress   X   Therapeutic Modalities:   Cognitive Behavioral Therapy Solution Focused Therapy Motivational Interviewing Relapse Prevention Therapy   Veryl Abril A [place], LCSWA

## 2022-10-31 NOTE — Anesthesia Preprocedure Evaluation (Signed)
Anesthesia Evaluation  Patient identified by MRN, date of birth, ID band Patient awake    Reviewed: Allergy & Precautions, NPO status , Patient's Chart, lab work & pertinent test results  Airway Mallampati: III  TM Distance: >3 FB Neck ROM: full    Dental  (+) Teeth Intact   Pulmonary neg pulmonary ROS   Pulmonary exam normal breath sounds clear to auscultation       Cardiovascular Exercise Tolerance: Good hypertension, Pt. on medications negative cardio ROS Normal cardiovascular exam Rhythm:Regular     Neuro/Psych  PSYCHIATRIC DISORDERS Anxiety Depression    negative neurological ROS  negative psych ROS   GI/Hepatic negative GI ROS, Neg liver ROS,,,  Endo/Other  negative endocrine ROS    Renal/GU negative Renal ROS  negative genitourinary   Musculoskeletal   Abdominal Normal abdominal exam  (+)   Peds negative pediatric ROS (+)  Hematology negative hematology ROS (+)   Anesthesia Other Findings Past Medical History: No date: Anxiety No date: Depression No date: Hypertension  Past Surgical History: No date: EYE SURGERY  BMI    Body Mass Index: 19.94 kg/m      Reproductive/Obstetrics negative OB ROS                             Anesthesia Physical Anesthesia Plan  ASA: 2  Anesthesia Plan: General   Post-op Pain Management:    Induction: Intravenous  PONV Risk Score and Plan: Propofol infusion and TIVA  Airway Management Planned: Natural Airway and Mask  Additional Equipment:   Intra-op Plan:   Post-operative Plan:   Informed Consent: I have reviewed the patients History and Physical, chart, labs and discussed the procedure including the risks, benefits and alternatives for the proposed anesthesia with the patient or authorized representative who has indicated his/her understanding and acceptance.     Dental Advisory Given  Plan Discussed with: CRNA and  Surgeon  Anesthesia Plan Comments:        Anesthesia Quick Evaluation

## 2022-10-31 NOTE — Group Note (Signed)
Recreation Therapy Group Note   Group Topic:Self-Esteem  Group Date: 10/31/2022 Start Time: 1400 End Time: 1450 Facilitators: Vilma Prader, LRT, CTRS Location:  Dayroom  Group Description: Patients and LRT discussed the importance of self-love and self-esteem. Pt completed a worksheet that helps them identify 24 different strengths and qualities about themselves. Pt encouraged to read aloud at least 3 off their sheet to the group. LRT and pts discussed how this can be applied to daily life post-discharge.  Pt's then played "Positive Affirmation Bingo" afterwards, with journals or stress balls as bingo prizes.   Affect/Mood: N/A   Participation Level: Did not attend    Clinical Observations/Individualized Feedback: Caitlyn [name] did not attend group due to being nauseous from ECT.   Plan: Continue to engage patient in RT group sessions 2-3x/week.   Vilma Prader, LRT, CTRS 10/31/2022 3:13 PM

## 2022-10-31 NOTE — [facility] Group Notes (Signed)
   [facility] Group Notes:  (Nursing/MHT/Case Management/Adjunct)   Date:  10/31/2022  Time:  8:15 PM    Type of Therapy:  Wrap Up    Participation Level:  Active   Participation Quality:  Appropriate   Affect:  Appropriate   Cognitive:  Appropriate   Insight:  Appropriate   Engagement in Group:  Engaged   Modes of Intervention:  Activity and Discussion   Summary of Progress/Problems:   Caitlyn [name]  10/31/2022 8:15 PM      

## 2022-10-31 NOTE — Transfer of Care (Signed)
Immediate Anesthesia Transfer of Care Note  Patient: Caitlyn [name]  Procedure(s) Performed: ECT TX  Patient Location: PACU  Anesthesia Type:MAC  Level of Consciousness: drowsy  Airway & Oxygen Therapy: Patient Spontanous Breathing  Post-op Assessment: Report given to RN  Post vital signs: Reviewed  Last Vitals:  Vitals Value Taken Time  BP 155/92 10/31/22 1242  Temp 36.8 C 10/31/22 1232  Pulse 51 10/31/22 1246  Resp 14 10/31/22 1246  SpO2 97 % 10/31/22 1246  Vitals shown include unvalidated device data.  Last Pain:  Vitals:   10/31/22 1236  TempSrc:   PainSc: 0-No pain         Complications: No notable events documented.

## 2022-10-31 NOTE — Plan of Care (Signed)
  Problem: Education: Goal: Knowledge of General Education information will improve Description: Including pain rating scale, medication(s)/side effects and non-pharmacologic comfort measures Outcome: Progressing   Problem: Health Behavior/Discharge Planning: Goal: Ability to manage health-related needs will improve Outcome: Progressing   Problem: Clinical Measurements: Goal: Ability to maintain clinical measurements within normal limits will improve Outcome: Progressing Goal: Will remain free from infection Outcome: Progressing Goal: Diagnostic test results will improve Outcome: Progressing Goal: Respiratory complications will improve Outcome: Progressing Goal: Cardiovascular complication will be avoided Outcome: Progressing   Problem: Nutrition: Goal: Adequate nutrition will be maintained Outcome: Progressing   Problem: Coping: Goal: Level of anxiety will decrease Outcome: Progressing   Problem: Elimination: Goal: Will not experience complications related to bowel motility Outcome: Progressing Goal: Will not experience complications related to urinary retention Outcome: Progressing   Problem: Skin Integrity: Goal: Risk for impaired skin integrity will decrease Outcome: Progressing   Problem: Safety: Goal: Ability to remain free from injury will improve Outcome: Progressing   

## 2022-11-01 ENCOUNTER — Other Ambulatory Visit: Payer: Self-pay | Admitting: Psychiatry

## 2022-11-01 NOTE — Group Note (Signed)
Date:  11/01/2022 Time:  11:11 AM  Group Topic/Focus:  Managing Feelings:   The focus of this group is to identify what feelings patients have difficulty handling and develop a plan to handle them in a healthier way upon discharge.    Participation Level:  Active  Participation Quality:  Appropriate  Affect:  Appropriate  Cognitive:  Alert and Appropriate  Insight: Appropriate  Engagement in Group:  Engaged  Modes of Intervention:  Discussion  Additional Comments:    Sande Pickert l Brad Mcgaughy 11/01/2022, 11:11 AM

## 2022-11-01 NOTE — Progress Notes (Signed)
   11/01/22 0730  Psych Admission Type (Psych Patients Only)  Admission Status Voluntary  Psychosocial Assessment  Patient Complaints None  Eye Contact Fair  Facial Expression Anxious;Worried  Affect Appropriate to circumstance  Catering manager Activity Other (Comment)  Appearance/Hygiene Unremarkable  Behavior Characteristics Cooperative  Mood Pleasant  Thought Process  Coherency WDL  Content WDL  Delusions None reported or observed  Perception WDL  Hallucination None reported or observed  Judgment WDL  Confusion None  Danger to Self  Current suicidal ideation? Denies  Danger to Others  Danger to Others None reported or observed  Danger to Others Abnormal  Harmful Behavior to others No threats or harm toward other people

## 2022-11-01 NOTE — Group Note (Signed)
LCSW Group Therapy Note  Group Date: 11/01/2022 Start Time: 1305 End Time: 1400   Type of Therapy and Topic:  Group Therapy - How To Cope with Nervousness about Discharge   Participation Level:  Active   Description of Group This process group involved identification of patients' feelings about discharge. Some of them are scheduled to be discharged soon, while others are new admissions, but each of them was asked to share thoughts and feelings surrounding discharge from the hospital. One common theme was that they are excited at the prospect of going home, while another was that many of them are apprehensive about sharing why they were hospitalized. Patients were given the opportunity to discuss these feelings with their peers in preparation for discharge.  Therapeutic Goals  Patient will identify their overall feelings about pending discharge. Patient will think about how they might proactively address issues that they believe will once again arise once they get home (i.e. with parents). Patients will participate in discussion about having hope for change.   Summary of Patient Progress:  She was very active throughout the session. She demonstrated good insight into the subject matter, and proved open to input from peers and feedback from [place]. She was respectful of peers and participated throughout the entire session. She said that she is still trying to think of activities that she can do when she gets back home because she is worried that sitting all day will make her become depressed again. CSW explored ideas with pt and she came up with trying activities at the [facility] and talking with the recreational staff at the facility who may have resources regarding community activities. She said she was looking forward to returning back to her life.   Therapeutic Modalities Cognitive Behavioral Therapy   Colt Martelle A [place], LCSWA 11/01/2022  2:24 PM

## 2022-11-01 NOTE — Group Note (Signed)
Recreation Therapy Group Note   Group Topic:Communication  Group Date: 11/01/2022 Start Time: 1400 End Time: 1455 Facilitators: Vilma Prader, LRT, CTRS Location: Courtyard  Group Description: [employer]. LRT brought pts outside to the courtyard to get fresh air and sunlight. During the time outside, we tossed around a beach ball that has many different prompts and questions on it while listening to music. After playing, pts and LRT played blackjack with a standard deck of cards.   Affect/Mood: Appropriate and Happy   Participation Level: Active and Engaged   Participation Quality: Independent   Behavior: Alert, Appropriate, and Eager   Speech/Thought Process: Coherent   Insight: Good and Improved   Judgement: Good and Improved   Modes of Intervention: Activity   Patient Response to Interventions:  Attentive, Engaged, Interested , and Receptive   Education Outcome:  Acknowledges education   Clinical Observations/Individualized Feedback: [place] was active in their participation of session activities and group discussion. Pt identified "Cowboy boots, a hat, cell phone, chaps, a cowboy belt and a rope" as things that a cowboy would have. Pt responded appropriately to prompted questions and interacted well with peers and LRT. Pt requested the group listens to "beach music" while the ball was being tossed and pt was observed dancing and smiling. Pt had a much brighter affect today than in previous interactions.   Plan: Continue to engage patient in RT group sessions 2-3x/week.   Vilma Prader, LRT, CTRS 11/01/2022 3:18 PM

## 2022-11-01 NOTE — Progress Notes (Signed)
[facility] MD Progress Note  11/01/2022 11:53 AM Caitlyn [name]  MRN:  [id number] Subjective: Caitlyn [name] is seen on rounds.  Her mood and affect have improved.  She been compliant with medications.  I spoke with Dr. Weber Cooks who wants to do the maintenance ECT upon discharge.  He said that he will get a hold of her.  Her daughter-in-law will pick her up tomorrow. Principal Problem: Major depressive disorder, recurrent severe without psychotic features ([place]) Diagnosis: Principal Problem:   Major depressive disorder, recurrent severe without psychotic features ([place])  Total Time spent with patient: 15 minutes  Past Psychiatric History: She saw a psychiatrist in [place] before moving to [place] and has been to Dr. Reece Levy twice for [name].    Past Medical History:  Past Medical History:  Diagnosis Date   Anxiety    Depression    Hypertension     Past Surgical History:  Procedure Laterality Date   EYE SURGERY     Family History:  Family History  Problem Relation Age of Onset   Breast cancer Neg Hx    Family Psychiatric  History: Unremarkable Social History:  Social History   Substance and Sexual Activity  Alcohol Use Never     Social History   Substance and Sexual Activity  Drug Use Never    Social History   Socioeconomic History   Marital status: Married    Spouse name: Not on file   Number of children: Not on file   Years of education: Not on file   Highest education level: Not on file  Occupational History   Not on file  Tobacco Use   Smoking status: Never   Smokeless tobacco: Never  Vaping Use   Vaping Use: Never used  Substance and Sexual Activity   Alcohol use: Never   Drug use: Never   Sexual activity: Not on file  Other Topics Concern   Not on file  Social History Narrative   Not on file   Social Determinants of Health   Financial Resource Strain: Not on file  Food Insecurity: No Food Insecurity (10/10/2022)   Hunger Vital Sign    Worried About Running Out of Food  in the Last Year: Never true    Ran Out of Food in the Last Year: Never true  Transportation Needs: No Transportation Needs (10/10/2022)   PRAPARE - Hydrologist (Medical): No    Lack of Transportation (Non-Medical): No  Physical Activity: Not on file  Stress: Not on file  Social Connections: Not on file   Additional Social History:                         Sleep: Good  Appetite:  Good  Current Medications: Current Facility-Administered Medications  Medication Dose Route Frequency Provider Last Rate Last Admin   0.9 %  sodium chloride infusion  500 mL Intravenous Once Clapacs, John T, MD       0.9 %  sodium chloride infusion  500 mL Intravenous Once Clapacs, Madie Reno, MD       acetaminophen (TYLENOL) tablet 650 mg  650 mg Oral Q6H PRN Patrecia Pour, NP       alum & mag hydroxide-simeth (MAALOX/MYLANTA) 200-200-20 MG/5ML suspension 30 mL  30 mL Oral Q4H PRN Patrecia Pour, NP       buPROPion (WELLBUTRIN XL) 24 hr tablet 300 mg  300 mg Oral q morning Patrecia Pour, NP  300 mg at 11/01/22 0933   clonazePAM (KLONOPIN) tablet 0.5 mg  0.5 mg Oral QHS Clapacs, John T, MD   0.5 mg at 10/31/22 2152   escitalopram (LEXAPRO) tablet 20 mg  20 mg Oral QPC breakfast Parks Ranger, DO   20 mg at 11/01/22 0933   fentaNYL (SUBLIMAZE) injection 25 mcg  25 mcg Intravenous Q5 min PRN [place] [employer], Jane Canary, MD       lactated ringers infusion   Intravenous Continuous Darrin Nipper, MD   New Bag at 10/26/22 1418   lisinopril (ZESTRIL) tablet 20 mg  20 mg Oral Daily Parks Ranger, DO   20 mg at 11/01/22 0933   LORazepam (ATIVAN) tablet 1 mg  1 mg Oral Q4H PRN Parks Ranger, DO       magnesium hydroxide (MILK OF MAGNESIA) suspension 30 mL  30 mL Oral Daily PRN Patrecia Pour, NP       mirtazapine (REMERON) tablet 15 mg  15 mg Oral QHS Parks Ranger, DO   15 mg at 10/31/22 2152   multivitamin with minerals tablet 1 tablet   1 tablet Oral Daily Clapacs, Madie Reno, MD   1 tablet at 11/01/22 0933   OLANZapine (ZYPREXA) tablet 5 mg  5 mg Oral [id number] Parks Ranger, DO   5 mg at 11/01/22 [id number]   oxyCODONE (Oxy IR/ROXICODONE) immediate release tablet 5 mg  5 mg Oral Once PRN Darrin Nipper, MD       Or   oxyCODONE (ROXICODONE) 5 MG/5ML solution 5 mg  5 mg Oral Once PRN Darrin Nipper, MD       QUEtiapine (SEROQUEL) tablet 50 mg  50 mg Oral QHS Patrecia Pour, NP   50 mg at 10/31/22 2152    Lab Results: No results found for this or any previous visit (from the past 38 hour(s)).  Blood Alcohol level:  Lab Results  Component Value Date   ETH <10 10/09/2022   ETH <10 [id number]    Metabolic Disorder Labs: Lab Results  Component Value Date   HGBA1C 4.9 10/12/2022   MPG 93.93 10/12/2022   No results found for: "PROLACTIN" Lab Results  Component Value Date   CHOL 215 (H) 10/12/2022   TRIG 78 10/12/2022   HDL 87 10/12/2022   CHOLHDL 2.5 10/12/2022   VLDL 16 10/12/2022   LDLCALC 112 (H) 10/12/2022    Physical Findings: AIMS:  , ,  ,  ,    CIWA:    COWS:     Musculoskeletal: Strength & Muscle Tone: within normal limits Gait & Station: normal Patient leans: N/A  Psychiatric Specialty Exam:  Presentation  General Appearance:  Casual; Well Groomed  Eye Contact: Good  Speech: Clear and Coherent; Normal Rate  Speech Volume: Normal  Handedness: Right   Mood and Affect  Mood: Anxious; Depressed; Hopeless  Affect: Congruent; Depressed   Thought Process  Thought Processes: Coherent; Goal Directed; Linear  Descriptions of Associations:Intact  Orientation:Full (Time, Place and Person)  Thought Content:Logical  History of Schizophrenia/Schizoaffective disorder:No data recorded Duration of Psychotic Symptoms:No data recorded Hallucinations:No data recorded Ideas of Reference:None  Suicidal Thoughts:No data recorded Homicidal Thoughts:No data recorded  Sensorium   Memory: Immediate Good; Recent Good; Remote Good  Judgment: Impaired  Insight: Fair   Community education officer  Concentration: Good  Attention Span: Good  Recall: Good  Fund of Knowledge: Good  Language: Good   Psychomotor Activity  Psychomotor Activity:No data recorded  Assets  Assets:  Communication Skills; Desire for Improvement; Financial Resources/Insurance; Housing; Intimacy; Physical Health   Sleep  Sleep:No data recorded   Physical Exam: Physical Exam Vitals and nursing note reviewed.  Constitutional:      Appearance: Normal appearance. She is normal weight.  Neurological:     General: No focal deficit present.     Mental Status: She is alert and oriented to person, place, and time.  Psychiatric:        Attention and Perception: Attention and perception normal.        Mood and Affect: Mood and affect normal.        Speech: Speech normal.        Behavior: Behavior normal. Behavior is cooperative.        Thought Content: Thought content normal.        Cognition and Memory: Cognition and memory normal.        Judgment: Judgment normal.    Review of Systems  Constitutional: Negative.   HENT: Negative.    Eyes: Negative.   Respiratory: Negative.    Cardiovascular: Negative.   Gastrointestinal: Negative.   Genitourinary: Negative.   Musculoskeletal: Negative.   Skin: Negative.   Neurological: Negative.   Endo/Heme/Allergies: Negative.   Psychiatric/Behavioral: Negative.     Blood pressure (!) 142/72, pulse (!) 47, temperature 97.6 F (36.4 C), temperature source Oral, resp. rate 18, height '5\' 2"'$  (1.575 m), weight 49.4 kg, SpO2 97 %. Body mass index is 19.94 kg/m.   Treatment Plan Summary: Daily contact with patient to assess and evaluate symptoms and progress in treatment, Medication management, and Plan continue current medications.  Discharge tomorrow.  Parks Ranger, DO 11/01/2022, 11:53 AM

## 2022-11-01 NOTE — Group Note (Signed)
Date:  11/01/2022 Time:  9:02 PM  Group Topic/Focus:  Goals Group:   The focus of this group is to help patients establish daily goals to achieve during treatment and discuss how the patient can incorporate goal setting into their daily lives to aide in recovery. Wrap-Up Group:   The focus of this group is to help patients review their daily goal of treatment and discuss progress on daily workbooks.    Participation Level:  Active  Participation Quality:  Attentive  Affect:  Appropriate  Cognitive:  Oriented  Insight: Good  Engagement in Group:  Engaged  Modes of Intervention:  Discussion  Additional Comments:  Adult Unit Workbook pg.[id number]  [place] Route 11/01/2022, 9:02 PM

## 2022-11-01 NOTE — Progress Notes (Signed)
Patient is alert and oriented times 4. Mood and affect anxious. Patient denies pain. She denies SI, HI, and AVH. Also denies feelings of anxiety and depression at this time. States she slept good last night.  Morning meds given whole by mouth W/O difficulty. Patient remains on unit with Q15 minute checks in place.

## 2022-11-01 NOTE — Progress Notes (Signed)
   11/01/22 2000  Psych Admission Type (Psych Patients Only)  Admission Status Voluntary  Psychosocial Assessment  Patient Complaints Anxiety;Depression  Eye Contact Fair  Facial Expression Other (Comment) (appropriate)  Affect Appropriate to circumstance  Speech Logical/coherent  Interaction Assertive  Motor Activity Other (Comment) (wnl)  Appearance/Hygiene Unremarkable  Behavior Characteristics Cooperative  Mood Pleasant  Thought Process  Coherency WDL  Content WDL  Delusions None reported or observed  Perception WDL  Hallucination None reported or observed  Judgment WDL  Confusion None  Danger to Self  Current suicidal ideation? Denies  Danger to Others  Danger to Others None reported or observed   Progress note   D: Pt seen in dayroom reading a book. Pt denies SI, HI, AVH. Pt rates pain  0/10. Pt rates anxiety  5/10 and depression  5/10. States she feels pretty good today. Ready to go home. Pt has much brighter affect. Says she will stay with her son until she finds out if her husband is safe enough medically to stay at their apartment instead of assisted living. No other concerns noted at this time.  A: Pt provided support and encouragement. Pt given scheduled medication as prescribed. PRNs as appropriate. Q15 min checks for safety.   R: Pt safe on the unit. Will continue to monitor.

## 2022-11-01 NOTE — BH Assessment (Signed)
1910 Received patient sitting in the day room alert and oriented  x 4. She is pleasant and cooperative. Currently denying SI/HI, A/V hallucinations, pain, depression or anxiety.20 Patient noted walking back to her room before group began.   2215 Patient remains medication compliant. She did stay and participated in group.  2300 Patient continue to rest quietly in bed. Will continue to monitor patient for safety.   0200 Patient resting quietly.  0600 Patient has rested quietly in bed most of shift. Will continue to monitor patient for safety.

## 2022-11-01 NOTE — Plan of Care (Signed)

## 2022-11-02 ENCOUNTER — Ambulatory Visit: Payer: Medicare Other

## 2022-11-02 ENCOUNTER — Inpatient Hospital Stay: Payer: Medicare Other | Admitting: General Practice

## 2022-11-02 MED ORDER — KETAMINE HCL 50 MG/5ML IJ SOSY
PREFILLED_SYRINGE | INTRAMUSCULAR | Status: AC
  Filled 2022-11-02: qty 5

## 2022-11-02 MED ORDER — CLONAZEPAM 0.5 MG PO TABS: 0.5000 mg | ORAL_TABLET | Freq: Every day | ORAL | 0 refills | Status: AC

## 2022-11-02 MED ORDER — OLANZAPINE 5 MG PO TABS: 5.0000 mg | ORAL_TABLET | ORAL | 3 refills | Status: AC

## 2022-11-02 MED ORDER — MIDAZOLAM HCL 2 MG/2ML IJ SOLN
INTRAMUSCULAR | Status: AC
  Filled 2022-11-02: qty 2

## 2022-11-02 MED ORDER — MIRTAZAPINE 15 MG PO TABS: 15.0000 mg | ORAL_TABLET | Freq: Every day | ORAL | 3 refills | Status: AC

## 2022-11-02 MED ORDER — BUPROPION HCL ER (XL) 300 MG PO TB24: 300.0000 mg | ORAL_TABLET | Freq: Every morning | ORAL | 3 refills | Status: AC

## 2022-11-02 MED ORDER — LISINOPRIL 20 MG PO TABS: 20.0000 mg | ORAL_TABLET | Freq: Every day | ORAL | 3 refills | Status: AC

## 2022-11-02 MED ORDER — ESCITALOPRAM OXALATE 20 MG PO TABS: 20.0000 mg | ORAL_TABLET | Freq: Every day | ORAL | 3 refills | Status: AC

## 2022-11-02 MED ORDER — ADULT MULTIVITAMIN W/MINERALS CH: 1.0000 | ORAL_TABLET | Freq: Every day | ORAL | 3 refills | Status: AC

## 2022-11-02 MED ORDER — QUETIAPINE FUMARATE 50 MG PO TABS: 50.0000 mg | ORAL_TABLET | Freq: Every day | ORAL | 3 refills | Status: AC

## 2022-11-02 NOTE — Discharge Summary (Signed)
Physician Discharge Summary Note  Patient:  Caitlyn [name] is an 77 y.o., female MRN:  [id number] DOB:  11-Sep-1945 Patient phone:  [phone] (home)  Patient address:   [id number] [place] [address],  Total Time spent with patient: 1 hour  Date of Admission:  10/10/2022 Date of Discharge: 11/02/2022  Reason for Admission:  77 year old woman with a history of recurrent severe depression transferred to [place] voluntarily from [place] because of a return of severe depressive symptoms. Patient was brought by her family to the emergency room in [place] because she was starting to have more frequent and specific suicidal thought she was discussing with her family. Patient and family agree that her depression has been getting worse for the past several weeks. Patient reports that she wakes up very early in the morning even with medication. Feels tired and run down and seems to have low motivation. Not able to really articulate very much in the way of things that she is enjoying currently. Says that appetite is fine but admits that she has lost some weight recently. Patient denies any hallucinations or delusions. She states that she has been compliant with her recommended outpatient medications and is able to accurately describe them to me. Family apparently has been concerned that she may not always be fully compliant with all medicine. Patient does not drink or abuse any drugs. Recent major stresses within the last year were relocating from [facility] to an assisted living facility here locally and also her husband's health declining.   Principal Problem: Major depressive disorder, recurrent severe without psychotic features [facility]) Discharge Diagnoses: Principal Problem:   Major depressive disorder, recurrent severe without psychotic features [facility])   Past Psychiatric History: She saw a psychiatrist in [place] before moving to [place] and has been to Dr. Reece Levy twice for [name].       Past Medical History:  Past Medical History:  Diagnosis Date   Anxiety    Depression    Hypertension     Past Surgical History:  Procedure Laterality Date   EYE SURGERY     Family History:  Family History  Problem Relation Age of Onset   Breast cancer Neg Hx    Family Psychiatric  History: Social History:  Social History   Substance and Sexual Activity  Alcohol Use Never     Social History   Substance and Sexual Activity  Drug Use Never    Social History   Socioeconomic History   Marital status: Married    Spouse name: Not on file   Number of children: Not on file   Years of education: Not on file   Highest education level: Not on file  Occupational History   Not on file  Tobacco Use   Smoking status: Never   Smokeless tobacco: Never  Vaping Use   Vaping Use: Never used  Substance and Sexual Activity   Alcohol use: Never   Drug use: Never   Sexual activity: Not on file  Other Topics Concern   Not on file  Social History Narrative   Not on file   Social Determinants of Health   Financial Resource Strain: Not on file  Food Insecurity: No Food Insecurity (10/10/2022)   Hunger Vital Sign    Worried About Running Out of Food in the Last Year: Never true    Ran Out of Food in the Last Year: Never true  Transportation Needs: No Transportation Needs (10/10/2022)   PRAPARE - Transportation  Lack of Transportation (Medical): No    Lack of Transportation (Non-Medical): No  Physical Activity: Not on file  Stress: Not on file  Social Connections: Not on file    Hospital Course: Caitlyn [name] is a 77 year old white female who was voluntarily admitted to inpatient geriatric psychiatry for worsening depression.  Family brought her to the emergency room because she was having persistent suicidal thoughts.  She is currently living in assisted living with her husband who has multiple medical problems.  She lacked motivation and was having trouble sleeping along with  depressed mood.  She was started on her antidepressant medications which ended up being changed with her Remeron being increased to 15 mg at bedtime and since she had done well on Lexapro in the past her Prozac was discontinued and she was started on Lexapro titrated up to 20 mg/day.  Zyprexa was added for more mood stabilization, depression, and appetite.  Her Klonopin was decreased and changed to all at bedtime.  She was feeling hopeless and helpless and therefore ECT was initiated.  She reported received 6 treatments and her mood and affect improved greatly.  Her blood pressure medicine was increased and she continued on Seroquel at bedtime.  Her Wellbutrin was maintained.  Overall, she tolerated the medications without any side effects.  She was sleeping well and eating well and was felt that she maximized hospitalization she was discharged home.  On the day of discharge she denied suicidal ideation, homicidal ideation, auditory or visual hallucinations.  Her judgment and insight were good.  She is going to receive maintenance ECT.  Physical Findings: AIMS:  , ,  ,  ,    CIWA:    COWS:     Musculoskeletal: Strength & Muscle Tone: within normal limits Gait & Station: normal Patient leans: N/A   Psychiatric Specialty Exam:  Presentation  General Appearance:  Casual; Well Groomed  Eye Contact: Good  Speech: Clear and Coherent; Normal Rate  Speech Volume: Normal  Handedness: Right   Mood and Affect  Mood: Anxious; Depressed; Hopeless  Affect: Congruent; Depressed   Thought Process  Thought Processes: Coherent; Goal Directed; Linear  Descriptions of Associations:Intact  Orientation:Full (Time, Place and Person)  Thought Content:Logical  History of Schizophrenia/Schizoaffective disorder:No data recorded Duration of Psychotic Symptoms:No data recorded Hallucinations:No data recorded Ideas of Reference:None  Suicidal Thoughts:No data recorded Homicidal Thoughts:No  data recorded  Sensorium  Memory: Immediate Good; Recent Good; Remote Good  Judgment: Impaired  Insight: Fair   Community education officer  Concentration: Good  Attention Span: Good  Recall: Good  Fund of Knowledge: Good  Language: Good   Psychomotor Activity  Psychomotor Activity:No data recorded  Assets  Assets: Communication Skills; Desire for Improvement; Financial Resources/Insurance; Housing; Intimacy; Physical Health   Sleep  Sleep:No data recorded   Physical Exam: Physical Exam Vitals and nursing note reviewed.  Constitutional:      Appearance: Normal appearance. She is normal weight.  Neurological:     General: No focal deficit present.     Mental Status: She is alert and oriented to person, place, and time.  Psychiatric:        Attention and Perception: Attention and perception normal.        Mood and Affect: Mood and affect normal.        Speech: Speech normal.        Behavior: Behavior normal. Behavior is cooperative.        Thought Content: Thought content normal.  Cognition and Memory: Cognition and memory normal.        Judgment: Judgment normal.    Review of Systems  Constitutional: Negative.   HENT: Negative.    Eyes: Negative.   Respiratory: Negative.    Cardiovascular: Negative.   Gastrointestinal: Negative.   Genitourinary: Negative.   Musculoskeletal: Negative.   Skin: Negative.   Neurological: Negative.   Endo/Heme/Allergies: Negative.   Psychiatric/Behavioral: Negative.     Blood pressure (!) 140/84, pulse (!) 49, temperature 97.9 F (36.6 C), temperature source Oral, resp. rate 18, height '5\' 2"'$  (1.575 m), weight 49.4 kg, SpO2 98 %. Body mass index is 19.94 kg/m.   Social History   Tobacco Use  Smoking Status Never  Smokeless Tobacco Never   Tobacco Cessation:  N/A, patient does not currently use tobacco products   Blood Alcohol level:  Lab Results  Component Value Date   ETH <10 10/09/2022   ETH <10  [id number]    Metabolic Disorder Labs:  Lab Results  Component Value Date   HGBA1C 4.9 10/12/2022   MPG 93.93 10/12/2022   No results found for: "PROLACTIN" Lab Results  Component Value Date   CHOL 215 (H) 10/12/2022   TRIG 78 10/12/2022   HDL 87 10/12/2022   CHOLHDL 2.5 10/12/2022   VLDL 16 10/12/2022   LDLCALC 112 (H) 10/12/2022    See Psychiatric Specialty Exam and Suicide Risk Assessment completed by Attending Physician prior to discharge.  Discharge destination:  Home  Is patient on multiple antipsychotic therapies at discharge:  No   Has Patient had three or more failed trials of antipsychotic monotherapy by history:  No  Recommended Plan for Multiple Antipsychotic Therapies: NA   Allergies as of 11/02/2022       Reactions   Penicillins         Medication List     STOP taking these medications    FLUoxetine 20 MG capsule Commonly known as: PROZAC       TAKE these medications      Indication  buPROPion 300 MG 24 hr tablet Commonly known as: WELLBUTRIN XL Take 1 tablet (300 mg total) by mouth every morning.  Indication: Major Depressive Disorder   clonazePAM 0.5 MG tablet Commonly known as: KLONOPIN Take 1 tablet (0.5 mg total) by mouth at bedtime.    cycloSPORINE 0.05 % ophthalmic emulsion Commonly known as: RESTASIS Place 1 drop into both eyes daily.    escitalopram 20 MG tablet Commonly known as: LEXAPRO Take 1 tablet (20 mg total) by mouth daily after breakfast. Start taking on: November 03, 2022  Indication: Major Depressive Disorder   lisinopril 20 MG tablet Commonly known as: ZESTRIL Take 1 tablet (20 mg total) by mouth daily. Start taking on: November 03, 2022 What changed:  medication strength how much to take  Indication: High Blood Pressure Disorder   mirtazapine 15 MG tablet Commonly known as: REMERON Take 1 tablet (15 mg total) by mouth at bedtime.  Indication: Major Depressive Disorder   multivitamin with minerals Tabs  tablet Take 1 tablet by mouth daily. Start taking on: November 03, 2022    OLANZapine 5 MG tablet Commonly known as: ZYPREXA Take 1 tablet (5 mg total) by mouth 2 (two) times daily at 8 am and 4 pm.  Indication: Major Depressive Disorder   QUEtiapine 50 MG tablet Commonly known as: SEROQUEL Take 1 tablet (50 mg total) by mouth at bedtime.  Indication: Generalized Anxiety Disorder, Major Depressive Disorder  [place] Follow up on 11/17/2022.   Specialty: Behavioral Health Why: You have a follow up scheduled for Thursday March 28th at 1:30pm with Dr. Reece Levy. Please contact their office if needed to reschedule. Thanks! Contact information: [address] [place] [address] [phone]                 Follow-up recommendations:  Dr. Reece Levy  Comments: Maintenance ECT with Dr. Weber Cooks.  Signed: Parks Ranger, DO 11/02/2022, 10:35 AM

## 2022-11-02 NOTE — Care Management Important Message (Signed)
Important Message  Patient Details  Name: Caitlyn [name] MRN: [id number] Date of Birth: [birth year]   Medicare Important Message Given:  Yes     Dannah Ryles A [place], Latanya Presser 11/02/2022, 9:32 AM

## 2022-11-02 NOTE — Progress Notes (Signed)
Patient did not proceed with ECT today due to discharge.

## 2022-11-02 NOTE — Progress Notes (Signed)
Patient denies SI, HI, and AVH. She also denies pain or other physical problems. Patient was educated on medications, prescriptions, follow up care, and when to call for help/suicide hotline. Patient filled out suicide safety plan and was given a copy upon discharge. Pt questions were answered and pt verbalized understanding and did not voice any concerns. Pt's belongings were returned. Pt was safely [facility] to [facility]. Patient not observed to be in distress at time of discharge.

## 2022-11-02 NOTE — BH IP Treatment Plan (Signed)
Interdisciplinary Treatment and Diagnostic Plan Update  11/02/2022 Time of Session: 9:00AM Caitlyn [name] MRN: [id number]  Principal Diagnosis: Major depressive disorder, recurrent severe without psychotic features ([place])  Secondary Diagnoses: Principal Problem:   Major depressive disorder, recurrent severe without psychotic features ([place])   Current Medications:  Current Facility-Administered Medications  Medication Dose Route Frequency Provider Last Rate Last Admin   0.9 %  sodium chloride infusion  500 mL Intravenous Once Clapacs, John T, MD       0.9 %  sodium chloride infusion  500 mL Intravenous Once Clapacs, Madie Reno, MD       acetaminophen (TYLENOL) tablet 650 mg  650 mg Oral Q6H PRN Patrecia Pour, NP       alum & mag hydroxide-simeth (MAALOX/MYLANTA) 200-200-20 MG/5ML suspension 30 mL  30 mL Oral Q4H PRN Patrecia Pour, NP       buPROPion (WELLBUTRIN XL) 24 hr tablet 300 mg  300 mg Oral q morning Patrecia Pour, NP   300 mg at 11/02/22 [id number]   clonazePAM (KLONOPIN) tablet 0.5 mg  0.5 mg Oral QHS Clapacs, John T, MD   0.5 mg at 11/01/22 2107   escitalopram (LEXAPRO) tablet 20 mg  20 mg Oral QPC breakfast Parks Ranger, DO   20 mg at 11/02/22 [id number]   fentaNYL (SUBLIMAZE) injection 25 mcg  25 mcg Intravenous Q5 min PRN [place] Service, Jane Canary, MD       lactated ringers infusion   Intravenous Continuous Darrin Nipper, MD   New Bag at 10/26/22 1418   lisinopril (ZESTRIL) tablet 20 mg  20 mg Oral Daily Parks Ranger, DO   20 mg at 11/02/22 [id number]   LORazepam (ATIVAN) tablet 1 mg  1 mg Oral Q4H PRN Parks Ranger, DO       magnesium hydroxide (MILK OF MAGNESIA) suspension 30 mL  30 mL Oral Daily PRN Patrecia Pour, NP       mirtazapine (REMERON) tablet 15 mg  15 mg Oral QHS Parks Ranger, DO   15 mg at 11/01/22 2107   multivitamin with minerals tablet 1 tablet  1 tablet Oral Daily Clapacs, Madie Reno, MD   1 tablet at 11/02/22 0812   OLANZapine  (ZYPREXA) tablet 5 mg  5 mg Oral [id number] Parks Ranger, DO   5 mg at 11/02/22 [id number]   oxyCODONE (Oxy IR/ROXICODONE) immediate release tablet 5 mg  5 mg Oral Once PRN Darrin Nipper, MD       Or   oxyCODONE (ROXICODONE) 5 MG/5ML solution 5 mg  5 mg Oral Once PRN Darrin Nipper, MD       QUEtiapine (SEROQUEL) tablet 50 mg  50 mg Oral QHS Patrecia Pour, NP   50 mg at 11/01/22 2106   PTA Medications: Medications Prior to Admission  Medication Sig Dispense Refill Last Dose   buPROPion (WELLBUTRIN XL) 300 MG 24 hr tablet Take 300 mg by mouth every morning.   10/16/2022   cycloSPORINE (RESTASIS) 0.05 % ophthalmic emulsion Place 1 drop into both eyes daily.   10/16/2022   FLUoxetine (PROZAC) 20 MG capsule Take 20-40 mg by mouth See admin instructions. Takes 20 mg and 40 mg on alternate days.   10/16/2022   lisinopril (ZESTRIL) 10 MG tablet Take 10 mg by mouth daily.   10/16/2022   QUEtiapine (SEROQUEL) 50 MG tablet Take 50 mg by mouth at bedtime.   10/16/2022    Patient Stressors: Other: just moved here from  [place]    Patient Strengths: Capable of independent living  Marketing executive fund of knowledge  Supportive family/friends   Treatment Modalities: Medication Management, Group therapy, Case management,  1 to 1 session with clinician, Psychoeducation, Recreational therapy.   Physician Treatment Plan for Primary Diagnosis: Major depressive disorder, recurrent severe without psychotic features ([place]) Long Term Goal(s): Improvement in symptoms so as ready for discharge   Short Term Goals: Compliance with prescribed medications will improve Ability to verbalize feelings will improve Ability to disclose and discuss suicidal ideas Ability to demonstrate self-control will improve Ability to identify and develop effective coping behaviors will improve  Medication Management: Evaluate patient's response, side effects, and tolerance of medication regimen.  Therapeutic  Interventions: 1 to 1 sessions, Unit Group sessions and Medication administration.  Evaluation of Outcomes: Adequate for Discharge  Physician Treatment Plan for Secondary Diagnosis: Principal Problem:   Major depressive disorder, recurrent severe without psychotic features ([place])  Long Term Goal(s): Improvement in symptoms so as ready for discharge   Short Term Goals: Compliance with prescribed medications will improve Ability to verbalize feelings will improve Ability to disclose and discuss suicidal ideas Ability to demonstrate self-control will improve Ability to identify and develop effective coping behaviors will improve     Medication Management: Evaluate patient's response, side effects, and tolerance of medication regimen.  Therapeutic Interventions: 1 to 1 sessions, Unit Group sessions and Medication administration.  Evaluation of Outcomes: Adequate for Discharge   RN Treatment Plan for Primary Diagnosis: Major depressive disorder, recurrent severe without psychotic features ([place]) Long Term Goal(s): Knowledge of disease and therapeutic regimen to maintain health will improve  Short Term Goals: Ability to remain free from injury will improve, Ability to verbalize frustration and anger appropriately will improve, Ability to demonstrate self-control, Ability to participate in decision making will improve, Ability to verbalize feelings will improve, Ability to disclose and discuss suicidal ideas, Ability to identify and develop effective coping behaviors will improve, and Compliance with prescribed medications will improve  Medication Management: RN will administer medications as ordered by provider, will assess and evaluate patient's response and provide education to patient for prescribed medication. RN will report any adverse and/or side effects to prescribing provider.  Therapeutic Interventions: 1 on 1 counseling sessions, Psychoeducation, Medication administration, Evaluate  responses to treatment, Monitor vital signs and CBGs as ordered, Perform/monitor CIWA, COWS, AIMS and Fall Risk screenings as ordered, Perform wound care treatments as ordered.  Evaluation of Outcomes: Adequate for Discharge   LCSW Treatment Plan for Primary Diagnosis: Major depressive disorder, recurrent severe without psychotic features ([place]) Long Term Goal(s): Safe transition to appropriate next level of care at discharge, Engage patient in therapeutic group addressing interpersonal concerns.  Short Term Goals: Engage patient in aftercare planning with referrals and resources, Increase social support, Increase ability to appropriately verbalize feelings, Increase emotional regulation, Facilitate acceptance of mental health diagnosis and concerns, and Increase skills for wellness and recovery  Therapeutic Interventions: Assess for all discharge needs, 1 to 1 time with Social worker, Explore available resources and support systems, Assess for adequacy in community support network, Educate family and significant other(s) on suicide prevention, Complete Psychosocial Assessment, Interpersonal group therapy.  Evaluation of Outcomes: Adequate for Discharge   Progress in Treatment: Attending groups: Yes. Participating in groups: Yes. Taking medication as prescribed: Yes. Toleration medication: Yes. Family/Significant other contact made: Yes, individual(s) contacted:  SPE completed with pt's daughter in law Manar Funches Patient understands diagnosis: Yes. Discussing patient identified problems/goals with  staff: Yes. Medical problems stabilized or resolved: Yes. Denies suicidal/homicidal ideation: Yes. Issues/concerns per patient self-inventory: No. Other: None  New problem(s) identified: No, Describe:  None  New Short Term/Long Term Goal(s): Patient to work towards medication management for mood stabilization; elimination of SI thoughts; development of comprehensive mental wellness plan.  Update 10/22/2022: No changes at this time. Update 10/27/22: No changes at this time.        Patient Goals:  "find motivation" Update 10/22/2022: No changes at this time. Update 10/27/22: No changes at this time.      Discharge Plan or Barriers: CSW will assist pt with development of appropriate discharge/aftercare plan.  Update 10/22/2022: Patient to continue receiving ECT. Will continue to follow for discharge planning. Update 10/27/22: No changes at this time.      Reason for Continuation of Hospitalization: Anxiety Depression   Last 3 [place] Suicide Severity Risk Score: Flowsheet Row Admission (Current) from 10/10/2022 in [place] ED from 10/09/2022 in [facility] Emergency Department at [facility] ED from 01/07/2022 in [facility] Emergency Department at [place] Error: Q3, 4, or 5 should not be populated when Q2 is No High Risk No Risk       Last PHQ 2/9 Scores:     No data to display          Scribe for Treatment Team: Nicholis Stepanek A [place], [place] 11/02/2022 9:24 AM

## 2022-11-02 NOTE — Group Note (Signed)
Recreation Therapy Group Note   Group Topic:Coping Skills  Group Date: 11/02/2022 Start Time: 1400 End Time: 1455 Facilitators: Vilma Prader, LRT, CTRS Location: Courtyard  Group Description: Journaling. Patient was given a packet with 20 different journaling prompts on it. Pt was encouraged to pick 3-5 different prompts to complete on lined paper provided. LRT and pt discussed the benefits of journaling and how it can be implemented post discharge. After journaling, patients chose to listen to music and play cards while enjoying fresh air and sunlight.   Affect/Mood: Appropriate and Happy   Participation Level: Active and Engaged   Participation Quality: Independent   Behavior: Alert, Appropriate, and Eager   Speech/Thought Process: Coherent   Insight: Good   Judgement: Good   Modes of Intervention: Activity and Education   Patient Response to Interventions:  Attentive, Engaged, Interested , and Receptive   Education Outcome:  Acknowledges education   Clinical Observations/Individualized Feedback: Avorie was active in their participation of session activities and group discussion. Pt identified "a lot of people back in history use to play cards all the time. It was fun, I should start playing again!". Pt had a bright affect and interacted well with peers and LRT duration of session.   Plan: Continue to engage patient in RT group sessions 2-3x/week.   Vilma Prader, LRT, CTRS 11/02/2022 3:38 PM

## 2022-11-02 NOTE — BHH Suicide Risk Assessment (Signed)
[facility] Discharge Suicide Risk Assessment   Principal Problem: Major depressive disorder, recurrent severe without psychotic features ([place]) Discharge Diagnoses: Principal Problem:   Major depressive disorder, recurrent severe without psychotic features ([place])   Total Time spent with patient: 1 hour  Musculoskeletal: Strength & Muscle Tone: within normal limits Gait & Station: normal Patient leans: N/A  Psychiatric Specialty Exam  Presentation  General Appearance:  Casual; Well Groomed  Eye Contact: Good  Speech: Clear and Coherent; Normal Rate  Speech Volume: Normal  Handedness: Right   Mood and Affect  Mood: Anxious; Depressed; Hopeless  Duration of Depression Symptoms: Less than two weeks  Affect: Congruent; Depressed   Thought Process  Thought Processes: Coherent; Goal Directed; Linear  Descriptions of Associations:Intact  Orientation:Full (Time, Place and Person)  Thought Content:Logical  History of Schizophrenia/Schizoaffective disorder:No data recorded Duration of Psychotic Symptoms:No data recorded Hallucinations:No data recorded Ideas of Reference:None  Suicidal Thoughts:No data recorded Homicidal Thoughts:No data recorded  Sensorium  Memory: Immediate Good; Recent Good; Remote Good  Judgment: Impaired  Insight: Fair   Community education officer  Concentration: Good  Attention Span: Good  Recall: Good  Fund of Knowledge: Good  Language: Good   Psychomotor Activity  Psychomotor Activity:No data recorded  Assets  Assets: Communication Skills; Desire for Improvement; Financial Resources/Insurance; Housing; Intimacy; Physical Health   Sleep  Sleep:No data recorded  Physical Exam: Physical Exam ROS Blood pressure (!) 140/84, pulse (!) 49, temperature 97.9 F (36.6 C), temperature source Oral, resp. rate 18, height '5\' 2"'$  (1.575 m), weight 49.4 kg, SpO2 98 %. Body mass index is 19.94 kg/m.  Mental Status Per Nursing  Assessment::   On Admission:  Suicidal ideation indicated by patient  Demographic Factors:  Age 77 or older and Caucasian  Loss Factors: NA  Historical Factors: NA  Risk Reduction Factors:   Sense of responsibility to family, Positive social support, Positive therapeutic relationship, and Positive coping skills or problem solving skills  Continued Clinical Symptoms:  Depression:   Anhedonia  Cognitive Features That Contribute To Risk:  None    Suicide Risk:  Minimal: No identifiable suicidal ideation.  Patients presenting with no risk factors but with morbid ruminations; may be classified as minimal risk based on the severity of the depressive symptoms   [place], [facility] Follow up on 11/17/2022.   Specialty: Behavioral Health Why: You have a follow up scheduled for Thursday March 28th at 1:30pm with Dr. Reece Levy. Please contact their office if needed to reschedule. Thanks! Contact information: [place] [address] [phone]                 Plan Of Care/Follow-up recommendations: Dr. Annita Brod, DO 11/02/2022, 10:17 AM

## 2022-11-02 NOTE — Progress Notes (Signed)
  [facility] Adult Case Management Discharge Plan :  Will you be returning to the same living situation after discharge:  Yes,  pt will be returning home At discharge, do you have transportation home?: Yes,  pt's family member will be providing transportation Do you have the ability to pay for your medications: Yes,  pt has [facility] Medicare  Release of information consent forms completed and in the chart;  Patient's signature needed at discharge.  Patient to Follow up at:  [place], [facility] Follow up on 11/17/2022.   Specialty: Behavioral Health Why: You have a follow up scheduled for Thursday March 28th at 1:30pm with Dr. Reece Levy. Please contact their office if needed to reschedule. Thanks! Contact information: [address] [place] [address] [phone]                 Next level of care provider has access to [place] and Suicide Prevention discussed: Yes,  SPE completed with pt's daughter in law     Has patient been referred to the Quitline?: N/A patient is not a smoker  Patient has been referred for addiction treatment: N/A  Caitlyn [name], [place] 11/02/2022, 9:33 AM

## 2022-11-11 ENCOUNTER — Ambulatory Visit: Admit: 2022-11-11 | Payer: MEDICARE | Attending: Family | Primary: Internal Medicine

## 2022-11-11 ENCOUNTER — Encounter

## 2022-11-11 DIAGNOSIS — N3001 Acute cystitis with hematuria: Secondary | ICD-10-CM

## 2022-11-11 LAB — AMB POC URINALYSIS DIP STICK MANUAL W/O MICRO
Glucose, Urine, POC: NEGATIVE
Nitrite, Urine, POC: NEGATIVE
Protein, Urine, POC: NEGATIVE
Specific Gravity, Urine, POC: 1.03 (ref 1.001–1.035)
Urobilinogen, POC: 0.2
pH, Urine, POC: 5 (ref 4.6–8.0)

## 2022-11-11 MED ORDER — PHENAZOPYRIDINE HCL 200 MG PO TABS
200 | ORAL_TABLET | Freq: Three times a day (TID) | ORAL | 0 refills | Status: AC | PRN
Start: 2022-11-11 — End: 2022-11-14

## 2022-11-11 MED ORDER — SULFAMETHOXAZOLE-TRIMETHOPRIM 800-160 MG PO TABS
800-160 | ORAL_TABLET | Freq: Two times a day (BID) | ORAL | 0 refills | Status: AC
Start: 2022-11-11 — End: 2022-11-16

## 2022-11-11 NOTE — Progress Notes (Signed)
Diana [name] (DOB:  [birth year]) is a 77 y.o. female, presents for evaluation of the following chief complaint(s):  Dysuria (Frequent urination started 3/4 days ago. )        Subjective   SUBJECTIVE/OBJECTIVE:  Pleasant 77 yo female presents to clinic w/ c/o dysuria, urinary frequency, chills x 4 days. Denies blood in urine, urinary retention, fever, n/v, body aches, abdominal/flank discomfort, vaginal d/c, abnormal vaginal bleeding, gential warts/lesions/rashes.        BP 124/72   Pulse 56   Temp 97.4 F (36.3 C)   Wt 54.9 kg (121 lb)   SpO2 97%   BMI 20.14 kg/m      Allergies   Allergen Reactions    Amitriptyline Hcl      Other reaction(s): sleep walking    Aspirin      Other reaction(s): bleeding ulcers    Codeine      Other reaction(s): Unknown    Pseudoeph-Bromphen-Cod      Other reaction(s): Hallucinations      Current Outpatient Medications   Medication Sig Dispense Refill    vitamin B-6 (PYRIDOXINE) 100 MG tablet Take 1 tablet by mouth daily      vitamin B-12 (CYANOCOBALAMIN) 1000 MCG tablet Take 1 tablet by mouth daily      clotrimazole-betamethasone (LOTRISONE) 1-0.05 % cream Apply topically 2 times daily to affected area. 45 g 1    cephALEXin (KEFLEX) 500 MG capsule Take 1 capsule by mouth 3 times daily 30 capsule 0    pramipexole (MIRAPEX) 1 MG tablet Take 1.5 tablets by mouth every evening 1.5 TABLETS AT BEDTIME ORALLY 90 DAYS 135 tablet 3    magnesium oxide (MAG-OX) 140 MG CAPS 500 mg      vitamin D-3 (CHOLECALCIFEROL) 125 MCG (5000 UT) TABS 5,000 Int'l Units      Bacillus Coagulans-Inulin (PROBIOTIC FORMULA) 1-250 BILLION-MG CAPS       acetaminophen (TYLENOL) 650 MG extended release tablet       zoster recombinant adjuvanted vaccine (SHINGRIX) 50 MCG/0.5ML SUSR injection       RA Vitamin E-Vit A & D 20000 units CREA 1 tablet Orally Twice a day (Patient not taking: Reported on 07/19/2022)      traZODone (DESYREL) 100 MG tablet TAKE 2 TABLETS BY MOUTH EVERY DAY AT BEDTIME      omeprazole  (PRILOSEC) 40 MG delayed release capsule 1 capsule (Patient not taking: Reported on 07/19/2022)       No current facility-administered medications for this visit.      Past Medical History:   Diagnosis Date    Anemia     Arthritis     Bleeding ulcer     DDD (degenerative disc disease), lumbar     History of blood transfusion     Hypertension     Hypothyroidism     Osteoporosis     Scoliosis       Past Surgical History:   Procedure Laterality Date    JOINT REPLACEMENT Left 08/2014    KNEE ARTHROSCOPY Left 2010    NOSE SURGERY      RESET AFTER BEING BROKEN    TOTAL HIP ARTHROPLASTY  08/23/2015    TOTAL KNEE ARTHROPLASTY Right 04/2018    TUBAL LIGATION        Family History   Problem Relation Age of Onset    Diabetes Mother     Stroke Mother     Rheum Arthritis Mother     Coronary Art Dis Mother  Heart Disease Father     Alzheimer's Disease Father     Coronary Art Dis Father     No Known Problems Brother     No Known Problems Daughter       Social History     Occupational History    Not on file   Tobacco Use    Smoking status: Never    Smokeless tobacco: Never   Vaping Use    Vaping Use: Never used   Substance and Sexual Activity    Alcohol use: Yes    Drug use: Never    Sexual activity: Not Currently     Partners: Male          Review of Systems   Constitutional:  Positive for chills. Negative for fever.   Genitourinary:  Positive for dysuria and frequency. Negative for decreased urine volume, difficulty urinating, dyspareunia, enuresis, flank pain, genital sores, hematuria, menstrual problem, pelvic pain, urgency, vaginal bleeding, vaginal discharge and vaginal pain.   All other systems reviewed and are negative.         Objective   Physical Exam  Vitals and nursing note reviewed.   Constitutional:       Appearance: Normal appearance.   HENT:      Head: Normocephalic and atraumatic.      Mouth/Throat:      Mouth: Mucous membranes are moist.      Pharynx: Oropharynx is clear.   Eyes:      Extraocular Movements:  Extraocular movements intact.      Conjunctiva/sclera: Conjunctivae normal.      Pupils: Pupils are equal, round, and reactive to light.   Abdominal:      General: Abdomen is flat. Bowel sounds are normal.      Palpations: Abdomen is soft.      Tenderness: There is no abdominal tenderness. There is no right CVA tenderness or left CVA tenderness.   Skin:     General: Skin is warm and dry.   Neurological:      General: No focal deficit present.      Mental Status: She is alert and oriented to person, place, and time.   Psychiatric:         Mood and Affect: Mood normal.         Behavior: Behavior normal.         Thought Content: Thought content normal.         Judgment: Judgment normal.       Results for orders placed or performed in visit on 11/11/22   POC Urinalysis Nonauto w/o Scope [id number])   Result Value Ref Range    Color (UA POC) Yellow     Clarity (UA POC) Clear     Glucose, Urine, POC Negative     Bilirubin, Urine, POC Small     Ketones, Urine, POC Moderate     Specific Gravity, Urine, POC 1.030 1.001 - 1.035    Blood (UA POC) Trace     pH, Urine, POC 5.0 4.6 - 8.0    Protein, Urine, POC Negative     Urobilinogen, POC 0.2 mg/dL     Nitrite, Urine, POC Negative     Leukocyte Esterase, Urine, POC Large         UA indicative of acute cystitis. Will send urine cx for abx sensitivity. Take abx w/ food/milk/probiotic to avoid gi upset. Pyridium may turn urine orange. Increase oral fluid intake, wipe front to back, void frequently, avoid the use of  feminine hygiene products/douches. F/u if symptoms persist or worsen. ER prec for severely worsening symptoms.          ASSESSMENT/PLAN:  1. Acute cystitis with hematuria  -     Culture, Urine; Future  -     sulfamethoxazole-trimethoprim (BACTRIM DS) 800-160 MG per tablet; Take 1 tablet by mouth 2 times daily for 5 days, Disp-10 tablet, R-0Normal  2. Dysuria  -     POC Urinalysis Nonauto w/o Scope (81002)  -     phenazopyridine (PYRIDIUM) 200 MG tablet; Take 1 tablet by  mouth 3 times daily as needed for Pain, Disp-9 tablet, R-0Normal      Return if symptoms worsen or fail to improve.           An electronic signature was used to authenticate this note.    --Inetta Fermo, APRN - NP

## 2022-11-13 LAB — CULTURE, URINE: FINAL REPORT: NO GROWTH

## 2022-11-23 DIAGNOSIS — S0990XA Unspecified injury of head, initial encounter: Secondary | ICD-10-CM | POA: Diagnosis not present

## 2023-01-12 ENCOUNTER — Ambulatory Visit: Admit: 2023-01-12 | Discharge: 2023-01-12 | Payer: MEDICARE | Attending: Family | Primary: Internal Medicine

## 2023-01-12 ENCOUNTER — Encounter

## 2023-01-12 DIAGNOSIS — R300 Dysuria: Secondary | ICD-10-CM

## 2023-01-12 LAB — AMB POC URINALYSIS DIP STICK MANUAL W/O MICRO
Bilirubin, Urine, POC: NEGATIVE
Glucose, Urine, POC: NEGATIVE
Ketones, Urine, POC: NEGATIVE
Nitrite, Urine, POC: NEGATIVE
Protein, Urine, POC: NEGATIVE
Specific Gravity, Urine, POC: 1.025 (ref 1.001–1.035)
Urobilinogen, POC: 0.2
pH, Urine, POC: 6 (ref 4.6–8.0)

## 2023-01-12 MED ORDER — SULFAMETHOXAZOLE-TRIMETHOPRIM 800-160 MG PO TABS
800-160 | ORAL_TABLET | Freq: Two times a day (BID) | ORAL | 0 refills | Status: AC
Start: 2023-01-12 — End: 2023-01-22

## 2023-01-12 NOTE — Patient Instructions (Signed)
Plan - Symptoms and UA indicative of UTI. Will treat with Bactrim DS PO BID x 10 days for recurrent UTI. Encouraged patient to drink plenty of water (2-4 L/day) and to complete the full course of the antibiotic. Proper hygiene and preventative measure discussed.  Advised pt to f/u w/ PCP if sxs continue, return to UC if sxs worsen. Urine C&S ordered. Patient in agreement with POC

## 2023-01-12 NOTE — Progress Notes (Signed)
HPI:    Diana [name] is a 77 y.o. female here today with Urinary Frequency (Dysuria, pressure started 3 days ago.)  . Diana [name] here today with c/o dysuria and increased urinary frequency for the past three days. Hx UTI x 2 this year. Denies fever/chills, hematuria, flank pain, vaginal discharge or itching.     Review of Systems   Constitutional:  Negative for activity change, appetite change, chills, diaphoresis, fatigue and fever.   Gastrointestinal:  Negative for abdominal pain, blood in stool, constipation, diarrhea and nausea.   Genitourinary:  Positive for difficulty urinating, dysuria, frequency and urgency. Negative for dyspareunia, flank pain, genital sores, hematuria, menstrual problem, pelvic pain, vaginal discharge and vaginal pain.   All other systems reviewed and are negative.       Current Outpatient Medications   Medication Sig Dispense Refill    sulfamethoxazole-trimethoprim (BACTRIM DS;SEPTRA DS) 800-160 MG per tablet Take 1 tablet by mouth 2 times daily for 10 days 20 tablet 0    vitamin B-6 (PYRIDOXINE) 100 MG tablet Take 1 tablet by mouth daily      vitamin B-12 (CYANOCOBALAMIN) 1000 MCG tablet Take 1 tablet by mouth daily      clotrimazole-betamethasone (LOTRISONE) 1-0.05 % cream Apply topically 2 times daily to affected area. 45 g 1    cephALEXin (KEFLEX) 500 MG capsule Take 1 capsule by mouth 3 times daily 30 capsule 0    pramipexole (MIRAPEX) 1 MG tablet Take 1.5 tablets by mouth every evening 1.5 TABLETS AT BEDTIME ORALLY 90 DAYS 135 tablet 3    magnesium oxide (MAG-OX) 140 MG CAPS 500 mg      vitamin D-3 (CHOLECALCIFEROL) 125 MCG (5000 UT) TABS 5,000 Int'l Units      Bacillus Coagulans-Inulin (PROBIOTIC FORMULA) 1-250 BILLION-MG CAPS       acetaminophen (TYLENOL) 650 MG extended release tablet       zoster recombinant adjuvanted vaccine (SHINGRIX) 50 MCG/0.5ML SUSR injection       RA Vitamin E-Vit A & D 20000 units CREA 1 tablet Orally Twice a day (Patient not taking: Reported on 07/19/2022)       traZODone (DESYREL) 100 MG tablet TAKE 2 TABLETS BY MOUTH EVERY DAY AT BEDTIME      omeprazole (PRILOSEC) 40 MG delayed release capsule 1 capsule (Patient not taking: Reported on 07/19/2022)       No current facility-administered medications for this visit.        Allergies   Allergen Reactions    Amitriptyline Hcl      Other reaction(s): sleep walking    Aspirin      Other reaction(s): bleeding ulcers    Codeine      Other reaction(s): Unknown    Pseudoeph-Bromphen-Cod      Other reaction(s): Hallucinations        Past Medical History:   Diagnosis Date    Anemia     Arthritis     Bleeding ulcer     DDD (degenerative disc disease), lumbar     History of blood transfusion     Hypertension     Hypothyroidism     Osteoporosis     Scoliosis         Past Surgical History:   Procedure Laterality Date    JOINT REPLACEMENT Left 08/2014    KNEE ARTHROSCOPY Left 2010    NOSE SURGERY      RESET AFTER BEING BROKEN    TOTAL HIP ARTHROPLASTY  08/23/2015    TOTAL KNEE  ARTHROPLASTY Right 04/2018    TUBAL LIGATION          Family History   Problem Relation Age of Onset    Diabetes Mother     Stroke Mother     Rheum Arthritis Mother     Coronary Art Dis Mother     Heart Disease Father     Alzheimer's Disease Father     Coronary Art Dis Father     No Known Problems Brother     No Known Problems Daughter         Social History     Socioeconomic History    Marital status: Married     Spouse name: Not on file    Number of children: Not on file    Years of education: Not on file    Highest education level: Not on file   Occupational History    Not on file   Tobacco Use    Smoking status: Never    Smokeless tobacco: Never   Vaping Use    Vaping Use: Never used   Substance and Sexual Activity    Alcohol use: Yes    Drug use: Never    Sexual activity: Not Currently     Partners: Male   Other Topics Concern    Not on file   Social History Narrative    Not on file     Social Determinants of Health     Financial Resource Strain: Not on file    Food Insecurity: Not on file   Transportation Needs: Not on file   Physical Activity: Insufficiently Active (02/23/2022)    Exercise Vital Sign     Days of Exercise per Week: 1 day     Minutes of Exercise per Session: 30 min   Stress: Not on file   Social Connections: Not on file   Intimate Partner Violence: Not on file   Housing Stability: Not on file        BP 114/66   Pulse 75   Temp 98.7 F (37.1 C)   Wt 53.5 kg (118 lb)   SpO2 97%   BMI 19.64 kg/m      Physical Exam  Vitals and nursing note reviewed.   Constitutional:       Appearance: Normal appearance.   HENT:      Head: Normocephalic and atraumatic.   Abdominal:      General: Abdomen is flat. Bowel sounds are normal.      Tenderness: There is no abdominal tenderness. There is no right CVA tenderness or left CVA tenderness.      Comments: No Suprapubic tenderness   Skin:     General: Skin is warm and dry.   Neurological:      Mental Status: She is alert.          Diana [name] was seen today for urinary frequency.    Diagnoses and all orders for this visit:    Dysuria  -     POC Urinalysis Nonauto w/o Scope (42595)    Cystitis with hematuria  -     Culture, Urine; Future    Other orders  -     sulfamethoxazole-trimethoprim (BACTRIM DS;SEPTRA DS) 800-160 MG per tablet; Take 1 tablet by mouth 2 times daily for 10 days     Plan - Symptoms and UA indicative of UTI. Will treat with Bactrim DS PO BID x 10 days for recurrent UTI. Encouraged patient to drink plenty of water (2-4 L/day)  and to complete the full course of the antibiotic. Proper hygiene and preventative measure discussed.  Advised pt to f/u w/ PCP if sxs continue, return to UC if sxs worsen. Urine C&S ordered. Patient in agreement with POC     Return if symptoms worsen or fail to improve.           Diana Limber, APRN - NP

## 2023-01-13 LAB — CULTURE, URINE: FINAL REPORT: NO GROWTH

## 2023-01-26 DIAGNOSIS — D2272 Melanocytic nevi of left lower limb, including hip: Secondary | ICD-10-CM | POA: Diagnosis not present

## 2023-01-26 DIAGNOSIS — L821 Other seborrheic keratosis: Secondary | ICD-10-CM | POA: Diagnosis not present

## 2023-01-26 DIAGNOSIS — D2271 Melanocytic nevi of right lower limb, including hip: Secondary | ICD-10-CM | POA: Diagnosis not present

## 2023-01-26 DIAGNOSIS — L814 Other melanin hyperpigmentation: Secondary | ICD-10-CM | POA: Diagnosis not present

## 2023-01-26 DIAGNOSIS — L570 Actinic keratosis: Secondary | ICD-10-CM | POA: Diagnosis not present

## 2023-02-22 ENCOUNTER — Encounter

## 2023-02-22 NOTE — Progress Notes (Signed)
Labs ordered for patients upcoming appointment. Patient notified.

## 2023-02-24 ENCOUNTER — Encounter

## 2023-02-24 LAB — COMPREHENSIVE METABOLIC PANEL
ALT: 39 U/L — ABNORMAL HIGH (ref 0–35)
AST: 43 U/L — ABNORMAL HIGH (ref 0–35)
Albumin/Globulin Ratio: 1.9 (ref 1.00–2.70)
Albumin: 4.4 g/dL (ref 3.5–5.2)
Alk Phosphatase: 58 U/L (ref 35–117)
Anion Gap: 10 mmol/L (ref 2–17)
BUN: 23 mg/dL (ref 8–23)
CO2: 26 mmol/L (ref 22–29)
Calcium: 9.4 mg/dL (ref 8.5–10.7)
Chloride: 103 mmol/L (ref 98–107)
Creatinine: 0.8 mg/dL (ref 0.5–1.0)
Est, Glom Filt Rate: 76 mL/min/1.73m (ref >=60)
Globulin: 2.3 g/dL (ref 1.9–4.4)
Glucose: 90 mg/dL (ref 70–99)
Osmolaliy Calculated: 281 mOsm/kg (ref 270–287)
Potassium: 4.4 mmol/L (ref 3.5–5.3)
Sodium: 139 mmol/L (ref 135–145)
Total Bilirubin: 0.4 mg/dL (ref 0.00–1.20)
Total Protein: 6.7 g/dL (ref 5.7–8.3)

## 2023-02-24 LAB — CBC WITH AUTO DIFFERENTIAL
Basophils %: 0.6 % (ref 0.0–2.0)
Basophils Absolute: 0 10*3/uL (ref 0.0–0.2)
Eosinophils %: 1.7 % (ref 0.0–7.0)
Eosinophils Absolute: 0.1 10*3/uL (ref 0.0–0.5)
Hematocrit: 34.6 % (ref 34.0–47.0)
Hemoglobin: 11.9 g/dL (ref 11.5–15.7)
Immature Grans (Abs): 0.01 10*3/uL (ref 0.00–0.06)
Immature Granulocytes %: 0.2 % (ref 0.0–0.6)
Lymphocytes Absolute: 1.9 10*3/uL (ref 1.0–3.2)
Lymphocytes: 39 % (ref 15.0–45.0)
MCH: 31.9 pg (ref 27.0–34.5)
MCHC: 34.4 g/dL (ref 30.0–36.0)
MCV: 92.8 fL (ref 81.0–99.0)
MPV: 10.1 fL (ref 7.0–12.2)
Monocytes %: 11.5 % (ref 4.0–12.0)
Monocytes Absolute: 0.6 10*3/uL (ref 0.3–1.0)
NRBC Absolute: 0 10*3/uL (ref 0.000–0.012)
NRBC Automated: 0 % (ref 0.0–0.2)
Neutrophils %: 47 % (ref 42.0–74.0)
Neutrophils Absolute: 2.3 10*3/uL (ref 1.6–7.3)
Platelets: 269 10*3/uL (ref 140–440)
RBC: 3.73 x10e6/mcL (ref 3.60–5.20)
RDW: 14.8 % (ref 10.0–17.0)
WBC: 4.8 10*3/uL (ref 3.8–10.6)

## 2023-02-24 LAB — LIPID PANEL
Chol/HDL Ratio: 3 (ref 0.0–4.4)
Cholesterol, Total: 200 mg/dL (ref 100–200)
HDL: 67 mg/dL (ref >=50)
LDL Cholesterol: 121.6 mg/dL — ABNORMAL HIGH (ref 0.0–100.0)
LDL/HDL Ratio: 1.8
Triglycerides: 57 mg/dL (ref 0–149)
VLDL: 11.4 mg/dL (ref 5.0–40.0)

## 2023-02-24 LAB — T4, FREE: T4 Free: 1 ng/dL (ref 0.82–1.70)

## 2023-02-24 LAB — TSH WITH REFLEX: TSH: 12.5 mcIU/mL — ABNORMAL HIGH (ref 0.358–3.740)

## 2023-02-27 ENCOUNTER — Ambulatory Visit
Admit: 2023-02-27 | Discharge: 2023-02-27 | Payer: Medicare Other | Attending: Internal Medicine | Primary: Internal Medicine

## 2023-02-27 DIAGNOSIS — Z0000 Encounter for general adult medical examination without abnormal findings: Secondary | ICD-10-CM

## 2023-02-27 DIAGNOSIS — E039 Hypothyroidism, unspecified: Secondary | ICD-10-CM

## 2023-02-27 MED ORDER — LEVOTHYROXINE SODIUM 50 MCG PO TABS
50 MCG | ORAL_TABLET | Freq: Every day | ORAL | 3 refills | Status: DC
Start: 2023-02-27 — End: 2023-08-30

## 2023-02-27 MED ORDER — PRAMIPEXOLE DIHYDROCHLORIDE 1 MG PO TABS
1 | ORAL_TABLET | Freq: Every evening | ORAL | 3 refills | 60.00000 days | Status: DC
Start: 2023-02-27 — End: 2024-03-15

## 2023-02-27 NOTE — Progress Notes (Signed)
CHIEF COMPLAINT:  Chief Complaint   Patient presents with    Medicare AWV     Patient has been having problems staying asleep.  Patient says that she takes tazadone and melatonin and they don't help        HISTORY OF PRESENT ILLNESS:  Diana [name] is a 77 y.o. female  with a history of depression, anxiety, and insomnia who presents reason: for Medicare Wellness visit and for follow up    PHQ:      02/27/2023     9:53 AM   PHQ-9    Little interest or pleasure in doing things 1   Feeling down, depressed, or hopeless 1   Trouble falling or staying asleep, or sleeping too much 2   Feeling tired or having little energy 2   Poor appetite or overeating 0   Feeling bad about yourself - or that you are a failure or have let yourself or your family down 1   Trouble concentrating on things, such as reading the newspaper or watching television 0   Moving or speaking so slowly that other people could have noticed. Or the opposite - being so fidgety or restless that you have been moving around a lot more than usual 0   Thoughts that you would be better off dead, or of hurting yourself in some way 0   PHQ-2 Score 2   PHQ-9 Total Score 7   If you checked off any problems, how difficult have these problems made it for you to do your work, take care of things at home, or get along with other people? 0       Past Medical History:   Diagnosis Date    Anemia     Arthritis     Bleeding ulcer     DDD (degenerative disc disease), lumbar     History of blood transfusion     Hypertension     Hypothyroidism     Osteoporosis     Restless legs syndrome     Many years ago    Scoliosis       Past Surgical History:   Procedure Laterality Date    JOINT REPLACEMENT Left 08/2014    KNEE ARTHROSCOPY Left 2010    NOSE SURGERY      RESET AFTER BEING BROKEN    TOTAL HIP ARTHROPLASTY  08/23/2015    TOTAL KNEE ARTHROPLASTY Right 04/2018    TUBAL LIGATION        Social History     Socioeconomic History    Marital status: Married     Spouse name: None    Number  of children: None    Years of education: None    Highest education level: None   Tobacco Use    Smoking status: Never    Smokeless tobacco: Never   Vaping Use    Vaping Use: Never used   Substance and Sexual Activity    Alcohol use: Yes     Alcohol/week: 3.0 standard drinks of alcohol     Types: 3 Glasses of wine per week    Drug use: Never    Sexual activity: Yes     Partners: Male     Social Determinants of Health     Physical Activity: Inactive (02/20/2023)    Exercise Vital Sign     Days of Exercise per Week: 0 days     Minutes of Exercise per Session: 30 min       Current Outpatient Medications  Medication Instructions    acetaminophen (TYLENOL) 650 MG extended release tablet No dose, route, or frequency recorded.    Bacillus Coagulans-Inulin (PROBIOTIC FORMULA) 1-250 BILLION-MG CAPS No dose, route, or frequency recorded.    diclofenac sodium (VOLTAREN) 1 % GEL     levothyroxine (SYNTHROID) 50 mcg, Oral, DAILY    magnesium oxide (MAG-OX) 500 mg    Multiple Vitamin (MULTIVITAMIN ADULT PO)     pramipexole (MIRAPEX) 1.5 mg, Oral, EVERY EVENING, 1.5 TABLETS AT BEDTIME ORALLY 90 DAYS    traZODone (DESYREL) 100 MG tablet TAKE 2 TABLETS BY MOUTH EVERY DAY AT BEDTIME    vitamin B-12 (CYANOCOBALAMIN) 1,000 mcg, Oral, DAILY    vitamin B-6 (PYRIDOXINE) 100 mg, Oral, DAILY    zoster recombinant adjuvanted vaccine (SHINGRIX) 50 MCG/0.5ML SUSR injection No dose, route, or frequency recorded.             Review of Systems   Constitutional:  Negative for chills and fever.   HENT:  Negative for congestion.    Respiratory:  Negative for cough and shortness of breath.    Cardiovascular:  Negative for chest pain and palpitations.   Gastrointestinal:  Negative for abdominal pain, constipation, diarrhea, nausea and vomiting.   Genitourinary:  Negative for frequency.   Musculoskeletal:  Negative for arthralgias and myalgias.   Neurological:  Negative for weakness.   Psychiatric/Behavioral:  Positive for sleep disturbance. Negative for  confusion and dysphoric mood.         BP 116/72 (Site: Right Upper Arm, Position: Sitting, Cuff Size: Medium Adult)   Pulse 66   Temp 97.9 F (36.6 C)   Ht 1.638 m (5' 4.5")   Wt 53.5 kg (118 lb)   SpO2 96%   BMI 19.94 kg/m     Objective   Physical Exam  Constitutional:       Appearance: Normal appearance.   HENT:      Head: Normocephalic and atraumatic.   Cardiovascular:      Rate and Rhythm: Normal rate and regular rhythm.      Heart sounds: Normal heart sounds.   Pulmonary:      Effort: Pulmonary effort is normal.      Breath sounds: Normal breath sounds. No wheezing.   Abdominal:      Palpations: Abdomen is soft.      Tenderness: There is no abdominal tenderness.   Musculoskeletal:         General: Normal range of motion.      Cervical back: Normal range of motion and neck supple.   Skin:     General: Skin is warm and dry.   Neurological:      General: No focal deficit present.      Mental Status: She is alert.   Psychiatric:         Mood and Affect: Mood normal.         Behavior: Behavior normal.         Thought Content: Thought content normal.         Judgment: Judgment normal.             Lab Results   Component Value Date    WBC 4.8 02/24/2023    HGB 11.9 02/24/2023    HCT 34.6 02/24/2023    MCV 92.8 02/24/2023    PLT 269 02/24/2023     Lab Results   Component Value Date/Time    NA 139 02/24/2023 08:32 AM    K 4.4 02/24/2023 08:32 AM  CL 103 02/24/2023 08:32 AM    CO2 26 02/24/2023 08:32 AM    BUN 23 02/24/2023 08:32 AM    CREATININE 0.8 02/24/2023 08:32 AM    GLUCOSE 90 02/24/2023 08:32 AM    CALCIUM 9.4 02/24/2023 08:32 AM      Lab Results   Component Value Date    CHOL 200 02/24/2023    CHOL 202 (H) 06/08/2022    CHOL 195 02/23/2022     Lab Results   Component Value Date    TRIG 57 02/24/2023    TRIG 121 06/08/2022    TRIG 66 02/23/2022     Lab Results   Component Value Date    HDL 67 02/24/2023    HDL 78 06/08/2022    HDL 88 02/23/2022     No components found for: "LDLCHOLESTEROL",  "LDLCALC"  Lab Results   Component Value Date    VLDL 11.4 02/24/2023    VLDL 24.2 06/08/2022    VLDL 13.2 02/23/2022     Lab Results   Component Value Date    CHOLHDLRATIO 3.0 02/24/2023    CHOLHDLRATIO 2.6 06/08/2022    CHOLHDLRATIO 2.2 02/23/2022     No results found for: "PSA"  Lab Results   Component Value Date    TSH 12.500 (H) 02/24/2023       IMPRESSION/PLAN  1. Medicare annual wellness visit, subsequent  2. Restless leg syndrome  Comments:  Continue Mirapex  Orders:  -     pramipexole (MIRAPEX) 1 MG tablet; Take 1.5 tablets by mouth every evening 1.5 TABLETS AT BEDTIME ORALLY 90 DAYS, Disp-135 tablet, R-3Normal  3. Acquired hypothyroidism  Comments:  Check TSH  Orders:  -     TSH with Reflex; Future  4. 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 Recurrent major depressive disorder, in partial remission (HCC)  Comments:  Controlled      Follow up and Dispositions:  Return in about 6 months (around 08/30/2023) for f/u thyroid.       Nicholes Rough, MD     An electronic signature was used to authenticate this note.Medicare Annual Wellness Visit    Diana [name] is here for Medicare AWV (Patient has been having problems staying asleep./Patient says that she takes tazadone and melatonin and they don't help)    Assessment & Plan   Medicare annual wellness visit, subsequent  Restless leg syndrome  Comments:  Continue Mirapex  Orders:  -     pramipexole (MIRAPEX) 1 MG tablet; Take 1.5 tablets by mouth every evening 1.5 TABLETS AT BEDTIME ORALLY 90 DAYS, Disp-135 tablet, R-3Normal  Acquired hypothyroidism  Comments:  Check TSH  Orders:  -     TSH with Reflex; Future  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  Recommended screening schedule for the next 5-10 years is provided to the patient in written form: see Patient Instructions/AVS.     Return in about 6 months (around 08/30/2023) for f/u thyroid.     Subjective       Patient's complete Health Risk Assessment and screening values have been reviewed and are found in Flowsheets. The following problems were reviewed today and where indicated follow up appointments were made  and/or referrals ordered.    Positive Risk Factor Screenings with Interventions:    Fall Risk:  Do you feel unsteady or are you worried about falling? : (!) yes  2 or more falls in past year?: no  Fall with injury in past year?: no     Interventions:    Reviewed medications, home hazards, visual acuity, and co-morbidities that can increase risk for falls  See AVS for additional education material     Depression:  PHQ-2 Score: 2  PHQ-9 Total Score: 7  Total Score Interpretation: 5-9 = mild depression  Interventions:  See AVS for additional education material                                 Objective   Vitals:    02/27/23 0913   BP: 116/72   Site: Right Upper Arm   Position: Sitting   Cuff Size: Medium Adult   Pulse: 66   Temp: 97.9 F (36.6 C)   SpO2: 96%   Weight: 53.5 kg (118 lb)   Height: 1.638 m (5' 4.5")      Body mass index is 19.94 kg/m.                    Allergies   Allergen Reactions    2,4-D Dimethylamine Hallucinations    Amitriptyline Hcl      Other reaction(s): sleep walking    Aspirin      Other reaction(s): bleeding ulcers    Codeine      Other reaction(s): Unknown    Pseudoeph-Bromphen-Cod      Other reaction(s): Hallucinations    Pseudoephedrine      Prior to Visit Medications    Medication Sig Taking? Authorizing Provider   pramipexole (MIRAPEX) 1 MG tablet Take 1.5 tablets by mouth every evening 1.5 TABLETS AT BEDTIME ORALLY 90 DAYS Yes Nicholes Rough, MD   levothyroxine (SYNTHROID) 50 MCG tablet Take 1 tablet by mouth daily Yes Nicholes Rough, MD   diclofenac sodium  (VOLTAREN) 1 % GEL  Yes [provider]   Multiple Vitamin (MULTIVITAMIN ADULT PO)  Yes [provider]   vitamin B-6 (PYRIDOXINE) 100 MG tablet Take 1 tablet by mouth daily Yes [provider]   vitamin B-12 (CYANOCOBALAMIN) 1000 MCG tablet Take 1 tablet by mouth daily Yes [provider]   magnesium oxide (MAG-OX) 140 MG CAPS 500 mg Yes [provider]   Bacillus Coagulans-Inulin (PROBIOTIC FORMULA) 1-250 BILLION-MG CAPS  Yes [provider]   acetaminophen (TYLENOL) 650 MG extended release tablet  Yes [provider]   traZODone (DESYREL) 100 MG tablet TAKE 2 TABLETS BY MOUTH EVERY DAY AT BEDTIME Yes [provider]   zoster recombinant adjuvanted vaccine (SHINGRIX) 50 MCG/0.5ML SUSR injection   [provider]       CareTeam (Including outside providers/suppliers regularly involved in providing care):   Patient Care Team:  Nicholes Rough, MD as PCP - General (Internal Medicine)  Nicholes Rough, MD as PCP - Empaneled Provider  Nicholes Rough, MD      Reviewed and updated this visit:  Tobacco  Allergies  Meds  Problems  Med Hx  Surg Hx  Soc Hx  Fam Hx

## 2023-03-01 ENCOUNTER — Encounter

## 2023-03-01 NOTE — Telephone Encounter (Signed)
Medication and/or diagnosis excluded from protocol, sent to office to fill

## 2023-03-01 NOTE — Telephone Encounter (Signed)
Medication was already refilled to [facility] on 02/27/23.  My chart message sent to patient to reach out to [facility]

## 2023-03-02 NOTE — Telephone Encounter (Signed)
OSTEOPOROSIS

## 2023-03-09 NOTE — Patient Instructions (Signed)
Preventing Falls: Care Instructions  Injuries and health problems such as trouble walking or poor eyesight can increase your risk of falling. So can some medicines. But there are things you can do to help prevent falls. You can exercise to get stronger. You can also arrange your home to make it safer.    Talk to your doctor about the medicines you take. Ask if any of them increase the risk of falls and whether they can be changed or stopped.   Try to exercise regularly. It can help improve your strength and balance. This can help lower your risk of falling.         Practice fall safety and prevention.   Wear low-heeled shoes that fit well and give your feet good support. Talk to your doctor if you have foot problems that make this hard.  Carry a cellphone or wear a medical alert device that you can use to call for help.  Use stepladders instead of chairs to reach high objects. Don't climb if you're at risk for falls. Ask for help, if needed.  Wear the correct eyeglasses, if you need them.        Make your home safer.   Remove rugs, cords, clutter, and furniture from walkways.  Keep your house well lit. Use night-lights in hallways and bathrooms.  Install and use sturdy handrails on stairways.  Wear nonskid footwear, even inside. Don't walk barefoot or in socks without shoes.        Be safe outside.   Use handrails, curb cuts, and ramps whenever possible.  Keep your hands free by using a shoulder bag or backpack.  Try to walk in well-lit areas. Watch out for uneven ground, changes in pavement, and debris.  Be careful in the winter. Walk on the grass or gravel when sidewalks are slippery. Use de-icer on steps and walkways. Add non-slip devices to shoes.    Put grab bars and nonskid mats in your shower or tub and near the toilet. Try to use a shower chair or bath bench when bathing.   Get into a tub or shower by putting in your weaker leg first. Get out with your strong side first. Have a phone or medical alert  device in the bathroom with you.   Where can you learn more?  Go to [employer] and enter G117 to learn more about "Preventing Falls: Care Instructions."  Current as of: March 07, 2022  Content Version: 14.1   2006-2024 [employer].   Care instructions adapted under license by [facility]. If you have questions about a medical condition or this instruction, always ask your healthcare professional. [employer] Inc[employer] disclaims any warranty or liability for your use of this information.           Learning About Mindfulness for Stress  What are mindfulness and stress?     Stress is your body's response to a hard situation. Your body can have a physical, emotional, or mental response. A lot of things can cause stress. You may feel stress when you go on a job interview, take a test, or run a race. This kind of short-term stress is normal and even useful. It can help you if you need to work hard or react quickly.  Stress also can last a long time. Long-term stress is caused by stressful situations or events. Examples of long-term stress include long-term health problems, ongoing problems at work, and conflicts in your family. Long-term stress can harm your health.  Mindfulness is a focus only on things happening in the present moment. It's a process of purposefully paying attention to and being aware of your surroundings, your emotions, your thoughts, and how your body feels. You are aware of these things, but you aren't judging these experiences as "good" or "bad." Mindfulness can help you learn to calm your mind and body to help you cope with illness, pain, and stress.  How does mindfulness help to relieve stress?  Mindfulness can help quiet your mind and relax your body. Studies show that it can help some people sleep better, feel less anxious, and bring their blood pressure down. And it's been shown to help some people live and cope better with certain health problems like  heart disease, depression, chronic pain, and cancer.  How do you practice mindfulness?  To be mindful is to pay attention, to be present, and to be accepting. Like any new skill or habit, being mindful can take practice.  When you're mindful, you do just one thing and you pay close attention to that one thing. For example, you may sit quietly and notice your emotions or how your food tastes and smells.  When you're present, you focus on the things that are happening right now. You let go of your thoughts about the past and the future. When you dwell on the past or the future, you miss moments that can heal and strengthen you. You may miss moments like hearing a child laugh or seeing a friendly face when you think you're all alone.  When you're accepting, you don't judge the present moment. Instead you accept your thoughts and feelings as they come.  You can practice anytime, anywhere, and in any way you choose. You can practice in many ways. Here are a few ideas:  While doing your chores, like washing the dishes, let your mind focus on what's in your hand. What does the dish feel like? Is the water warm or cold?  Go outside and take a few deep breaths. What is the air like? Is it warm or cold?  When you can, take some time at the start of your day to sit alone and think.  Take a slow walk by yourself. Count your steps while you breathe in and out.  Try yoga breathing exercises, stretches, and poses to strengthen and relax your muscles.  At work, if you can, try to stop for a few moments each hour. Note how your body feels. Let yourself regroup and let your mind settle before you return to what you were doing.  If you struggle with anxiety or "worry thoughts," imagine your mind as a blue sky and your worry thoughts as clouds. Now imagine those worry thoughts floating across your mind's sky. Just let them pass by as you watch.  Follow-up care is a key part of your treatment and safety. Be sure to make and go to all  appointments, and call your doctor if you are having problems. It's also a good idea to know your test results and keep a list of the medicines you take.  Where can you learn more?  Go to [employer] and enter M676 to learn more about "Learning About Mindfulness for Stress."  Current as of: February 12, 2022  Content Version: 14.1   2006-2024 [employer], [employer].   Care instructions adapted under license by [facility]. If you have questions about a medical condition or this instruction, always ask your healthcare professional. [employer] disclaims any warranty  or liability for your use of this information.           A Healthy Heart: Care Instructions  Overview     Coronary artery disease, also called heart disease, occurs when a substance called plaque builds up in the vessels that supply oxygen-rich blood to your heart muscle. This can narrow the blood vessels and reduce blood flow. A heart attack happens when blood flow is completely blocked. A high-fat diet, smoking, and other factors increase the risk of heart disease.  Your doctor has found that you have a chance of having heart disease. A heart-healthy lifestyle can help keep your heart healthy and prevent heart disease. This lifestyle includes eating healthy, being active, staying at a weight that's healthy for you, and not smoking or using tobacco. It also includes taking medicines as directed, managing other health conditions, and trying to get a healthy amount of sleep.  Follow-up care is a key part of your treatment and safety. Be sure to make and go to all appointments, and call your doctor if you are having problems. It's also a good idea to know your test results and keep a list of the medicines you take.  How can you care for yourself at home?  Diet   Use less salt when you cook and eat. This helps lower your blood pressure. Taste food before salting. Add only a little salt when you think you need it. With  time, your taste buds will adjust to less salt.    Eat fewer snack items, fast foods, canned soups, and other high-salt, high-fat, processed foods.    Read food labels and try to avoid saturated and trans fats. They increase your risk of heart disease by raising cholesterol levels.    Limit the amount of solid fat--butter, margarine, and shortening--you eat. Use olive, peanut, or canola oil when you cook. Bake, broil, and steam foods instead of frying them.    Eat a variety of fruit and vegetables every day. Dark green, deep orange, red, or yellow fruits and vegetables are especially good for you. Examples include spinach, carrots, peaches, and berries.    Foods high in fiber can reduce your cholesterol and provide important vitamins and minerals. High-fiber foods include whole-grain cereals and breads, oatmeal, beans, brown rice, citrus fruits, and apples.    Eat lean proteins. Heart-healthy proteins include seafood, lean meats and poultry, eggs, beans, peas, nuts, seeds, and soy products.    Limit drinks and foods with added sugar. These include candy, desserts, and soda pop.   Heart-healthy lifestyle   If your doctor recommends it, get more exercise. For many people, walking is a good choice. Or you may want to swim, bike, or do other activities. Bit by bit, increase the time you're active every day. Try for at least 30 minutes on most days of the week.    Try to quit or cut back on using tobacco and other nicotine products. This includes smoking and vaping. If you need help quitting, talk to your doctor about stop-smoking programs and medicines. These can increase your chances of quitting for good. Quitting is one of the most important things you can do to protect your heart. It is never too late to quit. Try to avoid secondhand smoke too.    Stay at a weight that's healthy for you. Talk to your doctor if you need help losing weight.    Try to get 7 to 9 hours of sleep each night.  Limit alcohol to  2 drinks a day for men and 1 drink a day for women. Too much alcohol can cause health problems.    Manage other health problems such as diabetes, high blood pressure, and high cholesterol. If you think you may have a problem with alcohol or drug use, talk to your doctor.   Medicines   Take your medicines exactly as prescribed. Call your doctor if you think you are having a problem with your medicine.    If your doctor recommends aspirin, take the amount directed each day. Make sure you take aspirin and not another kind of pain reliever, such as acetaminophen (Tylenol).   When should you call for help?   Call 911 if you have symptoms of a heart attack. These may include:   Chest pain or pressure, or a strange feeling in the chest.    Sweating.    Shortness of breath.    Pain, pressure, or a strange feeling in the back, neck, jaw, or upper belly or in one or both shoulders or arms.    Lightheadedness or sudden weakness.    A fast or irregular heartbeat.   After you call 911, the operator may tell you to chew 1 adult-strength or 2 to 4 low-dose aspirin. Wait for an ambulance. Do not try to drive yourself.  Watch closely for changes in your health, and be sure to contact your doctor if you have any problems.  Where can you learn more?  Go to [employer] and enter F075 to learn more about "A Healthy Heart: Care Instructions."  Current as of: February 12, 2022  Content Version: 14.1   2006-2024 [employer], Inc[employer].   Care instructions adapted under license by [facility]. If you have questions about a medical condition or this instruction, always ask your healthcare professional. Health[employer], Incorporated disclaims any warranty or liability for your use of this information.      Personalized Preventive Plan for Diana [name] - 02/27/2023  Medicare offers a range of preventive health benefits. Some of the tests and screenings are paid in full while other may be subject to a deductible,  co-insurance, and/or copay.    Some of these benefits include a comprehensive review of your medical history including lifestyle, illnesses that may run in your family, and various assessments and screenings as appropriate.    After reviewing your medical record and screening and assessments performed today your provider may have ordered immunizations, labs, imaging, and/or referrals for you.  A list of these orders (if applicable) as well as your Preventive Care list are included within your After Visit Summary for your review.    Other Preventive Recommendations:    A preventive eye exam performed by an eye specialist is recommended every 1-2 years to screen for glaucoma; cataracts, macular degeneration, and other eye disorders.  A preventive dental visit is recommended every 6 months.  Try to get at least 150 minutes of exercise per week or 10,000 steps per day on a pedometer .  Order or download the FREE "Exercise & Physical Activity: Your Everyday Guide" from [employer]. Call [phone] or search [employer] online.  You need 1200-1500 mg of calcium and 1000-2000 IU of vitamin D per day. It is possible to meet your calcium requirement with diet alone, but a vitamin D supplement is usually necessary to meet this goal.  When exposed to the sun, use a sunscreen that protects against both UVA and  UVB radiation with an SPF of 30 or greater. Reapply every 2 to 3 hours or after sweating, drying off with a towel, or swimming.  Always wear a seat belt when traveling in a car. Always wear a helmet when riding a bicycle or motorcycle.

## 2023-03-27 DIAGNOSIS — R61 Generalized hyperhidrosis: Secondary | ICD-10-CM | POA: Diagnosis not present

## 2023-03-27 DIAGNOSIS — R300 Dysuria: Secondary | ICD-10-CM | POA: Diagnosis not present

## 2023-03-28 DIAGNOSIS — R634 Abnormal weight loss: Secondary | ICD-10-CM | POA: Diagnosis not present

## 2023-03-28 DIAGNOSIS — E871 Hypo-osmolality and hyponatremia: Secondary | ICD-10-CM | POA: Diagnosis not present

## 2023-03-28 DIAGNOSIS — N3000 Acute cystitis without hematuria: Secondary | ICD-10-CM | POA: Diagnosis not present

## 2023-03-28 DIAGNOSIS — N289 Disorder of kidney and ureter, unspecified: Secondary | ICD-10-CM | POA: Diagnosis not present

## 2023-04-02 DIAGNOSIS — N10 Acute tubulo-interstitial nephritis: Secondary | ICD-10-CM | POA: Diagnosis not present

## 2023-04-02 DIAGNOSIS — N12 Tubulo-interstitial nephritis, not specified as acute or chronic: Secondary | ICD-10-CM | POA: Diagnosis not present

## 2023-04-03 DIAGNOSIS — N10 Acute tubulo-interstitial nephritis: Secondary | ICD-10-CM | POA: Diagnosis not present

## 2023-04-03 DIAGNOSIS — N390 Urinary tract infection, site not specified: Secondary | ICD-10-CM | POA: Diagnosis not present

## 2023-04-03 DIAGNOSIS — M545 Low back pain, unspecified: Secondary | ICD-10-CM | POA: Diagnosis not present

## 2023-04-04 DIAGNOSIS — N10 Acute tubulo-interstitial nephritis: Secondary | ICD-10-CM | POA: Diagnosis not present

## 2023-04-05 DIAGNOSIS — N10 Acute tubulo-interstitial nephritis: Secondary | ICD-10-CM | POA: Diagnosis not present

## 2023-04-06 DIAGNOSIS — I491 Atrial premature depolarization: Secondary | ICD-10-CM | POA: Diagnosis not present

## 2023-04-07 DIAGNOSIS — R9431 Abnormal electrocardiogram [ECG] [EKG]: Secondary | ICD-10-CM | POA: Diagnosis not present

## 2023-04-11 DIAGNOSIS — I493 Ventricular premature depolarization: Secondary | ICD-10-CM | POA: Diagnosis not present

## 2023-04-12 NOTE — Telephone Encounter (Signed)
Labs faxed to the number provided

## 2023-04-12 NOTE — Telephone Encounter (Signed)
Needs latest lab results faxed to [phone] due to age for CT with and without contrast.  Appt today at 4pm

## 2023-04-19 ENCOUNTER — Encounter

## 2023-06-28 ENCOUNTER — Other Ambulatory Visit: Payer: Self-pay | Admitting: Internal Medicine

## 2023-06-28 DIAGNOSIS — Z1231 Encounter for screening mammogram for malignant neoplasm of breast: Secondary | ICD-10-CM

## 2023-07-05 DIAGNOSIS — H26492 Other secondary cataract, left eye: Secondary | ICD-10-CM | POA: Diagnosis not present

## 2023-07-05 DIAGNOSIS — H16223 Keratoconjunctivitis sicca, not specified as Sjogren's, bilateral: Secondary | ICD-10-CM | POA: Diagnosis not present

## 2023-07-25 ENCOUNTER — Ambulatory Visit
Admission: RE | Admit: 2023-07-25 | Discharge: 2023-07-25 | Disposition: A | Discharge status: Home / Self Care | Payer: Medicare Other | Source: Ambulatory Visit | Attending: Internal Medicine | Admitting: Internal Medicine

## 2023-07-25 DIAGNOSIS — Z1231 Encounter for screening mammogram for malignant neoplasm of breast: Secondary | ICD-10-CM

## 2023-08-02 DIAGNOSIS — I10 Essential (primary) hypertension: Secondary | ICD-10-CM | POA: Diagnosis not present

## 2023-08-09 DIAGNOSIS — Z0000 Encounter for general adult medical examination without abnormal findings: Secondary | ICD-10-CM | POA: Diagnosis not present

## 2023-08-10 ENCOUNTER — Other Ambulatory Visit (HOSPITAL_COMMUNITY): Payer: Self-pay | Admitting: Internal Medicine

## 2023-08-10 DIAGNOSIS — Z0000 Encounter for general adult medical examination without abnormal findings: Secondary | ICD-10-CM

## 2023-08-24 ENCOUNTER — Ambulatory Visit (HOSPITAL_COMMUNITY)
Admission: RE | Admit: 2023-08-24 | Discharge: 2023-08-24 | Disposition: A | Discharge status: Home / Self Care | Payer: Self-pay | Source: Ambulatory Visit | Attending: Internal Medicine | Admitting: Internal Medicine

## 2023-08-24 DIAGNOSIS — Z0000 Encounter for general adult medical examination without abnormal findings: Secondary | ICD-10-CM

## 2023-08-30 ENCOUNTER — Ambulatory Visit: Admit: 2023-08-30 | Discharge: 2023-08-30 | Payer: MEDICARE | Attending: Internal Medicine | Primary: Internal Medicine

## 2023-08-30 VITALS — BP 110/68 | HR 59 | Ht 64.0 in | Wt 119.0 lb

## 2023-08-30 DIAGNOSIS — E039 Hypothyroidism, unspecified: Secondary | ICD-10-CM

## 2023-08-30 MED ORDER — LEVOTHYROXINE SODIUM 50 MCG PO TABS
50 | ORAL_TABLET | Freq: Every day | ORAL | 3 refills | Status: DC
Start: 2023-08-30 — End: 2024-03-15

## 2023-08-30 NOTE — Progress Notes (Unsigned)
CHIEF COMPLAINT:  Chief Complaint   Patient presents with    Follow-up        HISTORY OF PRESENT ILLNESS:  [name] is a 78 y.o. female  with a history of hypothyroidism who presents reason: for follow up    PHQ:      08/30/2023     2:33 PM   PHQ-9    Little interest or pleasure in doing things 1   Feeling down, depressed, or hopeless 1   Trouble falling or staying asleep, or sleeping too much 2   Feeling tired or having little energy 1   Poor appetite or overeating 1   Feeling bad about yourself - or that you are a failure or have let yourself or your family down 1   Trouble concentrating on things, such as reading the newspaper or watching television 0   Moving or speaking so slowly that other people could have noticed. Or the opposite - being so fidgety or restless that you have been moving around a lot more than usual 0   Thoughts that you would be better off dead, or of hurting yourself in some way 0   PHQ-2 Score 2   PHQ-9 Total Score 7   If you checked off any problems, how difficult have these problems made it for you to do your work, take care of things at home, or get along with other people? 0       Past Medical History:   Diagnosis Date    Anemia     Arthritis     Bleeding ulcer     DDD (degenerative disc disease), lumbar     History of blood transfusion     Hypertension     Hypothyroidism     Osteoporosis     Restless legs syndrome     Many years ago    Scoliosis       Past Surgical History:   Procedure Laterality Date    JOINT REPLACEMENT Left 08/2014    KNEE ARTHROSCOPY Left 2010    NOSE SURGERY      RESET AFTER BEING BROKEN    TOTAL HIP ARTHROPLASTY  08/23/2015    TOTAL KNEE ARTHROPLASTY Right 04/2018    TUBAL LIGATION        Social History     Socioeconomic History    Marital status: Married     Spouse name: None    Number of children: None    Years of education: None    Highest education level: None   Tobacco Use    Smoking status: Never    Smokeless tobacco: Never   Vaping Use    Vaping status:  Never Used   Substance and Sexual Activity    Alcohol use: Yes     Alcohol/week: 3.0 standard drinks of alcohol     Types: 3 Glasses of wine per week    Drug use: Never    Sexual activity: Yes     Partners: Male     Social Determinants of Health     Physical Activity: Inactive (02/20/2023)    Exercise Vital Sign     Days of Exercise per Week: 0 days     Minutes of Exercise per Session: 30 min       Current Outpatient Medications   Medication Instructions    acetaminophen (TYLENOL) 650 MG extended release tablet No dose, route, or frequency recorded.    Bacillus Coagulans-Inulin (PROBIOTIC FORMULA) 1-250 BILLION-MG CAPS No dose, route,  or frequency recorded.    diclofenac sodium (VOLTAREN) 1 % GEL     levothyroxine (SYNTHROID) 50 mcg, Oral, DAILY    magnesium oxide (MAG-OX) 500 mg    Multiple Vitamin (MULTIVITAMIN ADULT PO)     pramipexole (MIRAPEX) 1.5 mg, Oral, EVERY EVENING, 1.5 TABLETS AT BEDTIME ORALLY 90 DAYS    traZODone (DESYREL) 100 MG tablet TAKE 2 TABLETS BY MOUTH EVERY DAY AT BEDTIME    vitamin B-12 (CYANOCOBALAMIN) 1,000 mcg, Oral, DAILY    vitamin B-6 (PYRIDOXINE) 100 mg, Oral, DAILY    zoster recombinant adjuvanted vaccine (SHINGRIX) 50 MCG/0.5ML SUSR injection Pt only got the first dose not the 2nd yet             Review of Systems   Constitutional:  Negative for chills and fever.   HENT:  Negative for congestion.    Respiratory:  Negative for cough and shortness of breath.    Cardiovascular:  Negative for chest pain and palpitations.   Gastrointestinal:  Negative for abdominal pain, constipation, diarrhea, nausea and vomiting.   Genitourinary:  Negative for frequency.   Musculoskeletal:  Negative for arthralgias and myalgias.   Neurological:  Negative for weakness.   Psychiatric/Behavioral:  Negative for confusion and dysphoric mood.         BP 110/68   Pulse 59   Ht 1.626 m (5\' 4" )   Wt 54 kg (119 lb)   SpO2 94%   BMI 20.43 kg/m     Objective   Physical Exam  Constitutional:       Appearance:  Normal appearance.   HENT:      Head: Normocephalic and atraumatic.   Cardiovascular:      Rate and Rhythm: Normal rate and regular rhythm.      Heart sounds: Normal heart sounds.   Pulmonary:      Effort: Pulmonary effort is normal.      Breath sounds: Normal breath sounds. No wheezing.   Abdominal:      Palpations: Abdomen is soft.      Tenderness: There is no abdominal tenderness.   Musculoskeletal:         General: Normal range of motion.      Cervical back: Normal range of motion and neck supple.   Skin:     General: Skin is warm and dry.   Neurological:      General: No focal deficit present.      Mental Status: She is alert.   Psychiatric:         Mood and Affect: Mood normal.         Behavior: Behavior normal.         Thought Content: Thought content normal.         Judgment: Judgment normal.             Lab Results   Component Value Date    WBC 4.8 02/24/2023    HGB 11.9 02/24/2023    HCT 34.6 02/24/2023    MCV 92.8 02/24/2023    PLT 269 02/24/2023     Lab Results   Component Value Date/Time    NA 139 02/24/2023 08:32 AM    K 4.4 02/24/2023 08:32 AM    CL 103 02/24/2023 08:32 AM    CO2 26 02/24/2023 08:32 AM    BUN 23 02/24/2023 08:32 AM    CREATININE 0.8 02/24/2023 08:32 AM    GLUCOSE 90 02/24/2023 08:32 AM    CALCIUM 9.4 02/24/2023 08:32 AM  Lab Results   Component Value Date    CHOL 200 02/24/2023    CHOL 202 (H) 06/08/2022    CHOL 195 02/23/2022     Lab Results   Component Value Date    TRIG 57 02/24/2023    TRIG 121 06/08/2022    TRIG 66 02/23/2022     Lab Results   Component Value Date    HDL 67 02/24/2023    HDL 78 06/08/2022    HDL 88 02/23/2022     No components found for: "LDLCHOLESTEROL", "LDLCALC"  Lab Results   Component Value Date    VLDL 11.4 02/24/2023    VLDL 24.2 06/08/2022    VLDL 13.2 02/23/2022     Lab Results   Component Value Date    CHOLHDLRATIO 3.0 02/24/2023    CHOLHDLRATIO 2.6 06/08/2022    CHOLHDLRATIO 2.2 02/23/2022     No results found for: "PSA"  Lab Results   Component  Value Date    TSH 12.500 (H) 02/24/2023       IMPRESSION/PLAN  1. Acquired hypothyroidism  -     levothyroxine (SYNTHROID) 50 MCG tablet; Take 1 tablet by mouth daily, Disp-90 tablet, R-3Normal  -     TSH reflex to FT4; Future  2. 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 Moderate major depression, single episode (HCC)  4. Hypervitaminosis D  -     Vitamin D 25 Hydroxy; Future  5. Dyslipidemia  -     Comprehensive Metabolic Panel; Future  -     Lipid Panel; Future      Follow up and Dispositions:  Return in about 6 months (around 02/27/2024) for [facility].       Nicholes Rough, MD     An electronic signature was used to authenticate this note.

## 2023-08-31 ENCOUNTER — Encounter

## 2023-08-31 LAB — CBC WITH AUTO DIFFERENTIAL
Basophils %: 0.5 % (ref 0.0–2.0)
Basophils Absolute: 0 10*3/uL (ref 0.0–0.2)
Eosinophils %: 1.1 % (ref 0.0–7.0)
Eosinophils Absolute: 0.1 10*3/uL (ref 0.0–0.5)
Hematocrit: 36.5 % (ref 34.0–47.0)
Hemoglobin: 11.9 g/dL (ref 11.5–15.7)
Immature Grans (Abs): 0.02 10*3/uL (ref 0.00–0.06)
Immature Granulocytes %: 0.3 % (ref 0.0–0.6)
Lymphocytes Absolute: 1.8 10*3/uL (ref 1.0–3.2)
Lymphocytes: 29 % (ref 15.0–45.0)
MCH: 31.4 pg (ref 27.0–34.5)
MCHC: 32.6 g/dL (ref 30.0–36.0)
MCV: 96.3 fL (ref 81.0–99.0)
MPV: 9.9 fL (ref 7.0–12.2)
Monocytes %: 11.3 % (ref 4.0–12.0)
Monocytes Absolute: 0.7 10*3/uL (ref 0.3–1.0)
NRBC Absolute: 0 10*3/uL (ref 0.000–0.012)
NRBC Automated: 0 % (ref 0.0–0.2)
Neutrophils %: 57.8 % (ref 42.0–74.0)
Neutrophils Absolute: 3.6 10*3/uL (ref 1.6–7.3)
Platelets: 316 10*3/uL (ref 140–440)
RBC: 3.79 x10e6/mcL (ref 3.60–5.20)
RDW: 13 % (ref 10.0–17.0)
WBC: 6.2 10*3/uL (ref 3.8–10.6)

## 2023-08-31 LAB — LIPID PANEL
Chol/HDL Ratio: 2.2 (ref 0.0–4.4)
Cholesterol, Total: 199 mg/dL (ref 100–200)
HDL: 89 mg/dL (ref >=50)
LDL Cholesterol: 98.6 mg/dL (ref 0.0–100.0)
LDL/HDL Ratio: 1.1
Triglycerides: 57 mg/dL (ref 0–149)
VLDL: 11.4 mg/dL (ref 5.0–40.0)

## 2023-08-31 LAB — COMPREHENSIVE METABOLIC PANEL
ALT: 27 U/L (ref 0–42)
AST: 34 U/L (ref 0–46)
Albumin/Globulin Ratio: 1.7 (ref 1.00–2.70)
Albumin: 4.4 g/dL (ref 3.5–5.2)
Alk Phosphatase: 87 U/L (ref 35–117)
Anion Gap: 10 mmol/L (ref 2–17)
BUN: 29 mg/dL — ABNORMAL HIGH (ref 8–23)
CO2: 28 mmol/L (ref 22–29)
Calcium: 9.2 mg/dL (ref 8.5–10.7)
Chloride: 101 mmol/L (ref 98–107)
Creatinine: 0.7 mg/dL (ref 0.5–1.0)
Est, Glom Filt Rate: 89 mL/min/1.73mÂ² (ref >=60)
Globulin: 2.6 g/dL (ref 1.9–4.4)
Glucose: 91 mg/dL (ref 70–99)
Osmolaliy Calculated: 283 mosm/kg (ref 270–287)
Potassium: 4.4 mmol/L (ref 3.5–5.3)
Sodium: 139 mmol/L (ref 135–145)
Total Bilirubin: 0.48 mg/dL (ref 0.00–1.20)
Total Protein: 7 g/dL (ref 5.7–8.3)

## 2023-08-31 LAB — TSH REFLEX TO FT4: TSH: 5.75 u[IU]/mL — ABNORMAL HIGH (ref 0.358–3.740)

## 2023-08-31 LAB — VITAMIN D 25 HYDROXY: Vit D, 25-Hydroxy: 36 ng/mL (ref 30.0–90.0)

## 2023-08-31 LAB — T4, FREE: T4 Free: 1.23 ng/dL (ref 0.82–1.70)

## 2023-09-12 DIAGNOSIS — L814 Other melanin hyperpigmentation: Secondary | ICD-10-CM | POA: Diagnosis not present

## 2023-09-12 DIAGNOSIS — H26492 Other secondary cataract, left eye: Secondary | ICD-10-CM | POA: Diagnosis not present

## 2023-09-12 DIAGNOSIS — D225 Melanocytic nevi of trunk: Secondary | ICD-10-CM | POA: Diagnosis not present

## 2023-09-12 DIAGNOSIS — L821 Other seborrheic keratosis: Secondary | ICD-10-CM | POA: Diagnosis not present

## 2023-09-12 DIAGNOSIS — L720 Epidermal cyst: Secondary | ICD-10-CM | POA: Diagnosis not present

## 2023-12-12 DIAGNOSIS — M810 Age-related osteoporosis without current pathological fracture: Secondary | ICD-10-CM | POA: Diagnosis not present

## 2023-12-19 DIAGNOSIS — M810 Age-related osteoporosis without current pathological fracture: Secondary | ICD-10-CM | POA: Diagnosis not present

## 2024-01-16 ENCOUNTER — Emergency Department (HOSPITAL_BASED_OUTPATIENT_CLINIC_OR_DEPARTMENT_OTHER)
Admission: EM | Admit: 2024-01-16 | Discharge: 2024-01-16 | Disposition: A | Discharge status: Home / Self Care | Attending: Emergency Medicine | Admitting: Emergency Medicine

## 2024-01-16 ENCOUNTER — Emergency Department (HOSPITAL_BASED_OUTPATIENT_CLINIC_OR_DEPARTMENT_OTHER)

## 2024-01-16 ENCOUNTER — Emergency Department (HOSPITAL_BASED_OUTPATIENT_CLINIC_OR_DEPARTMENT_OTHER): Admitting: Radiology

## 2024-01-16 ENCOUNTER — Other Ambulatory Visit: Payer: Self-pay

## 2024-01-16 ENCOUNTER — Encounter (HOSPITAL_BASED_OUTPATIENT_CLINIC_OR_DEPARTMENT_OTHER): Payer: Self-pay

## 2024-01-16 DIAGNOSIS — S62397B Other fracture of fifth metacarpal bone, left hand, initial encounter for open fracture: Secondary | ICD-10-CM | POA: Diagnosis not present

## 2024-01-16 DIAGNOSIS — S6992XA Unspecified injury of left wrist, hand and finger(s), initial encounter: Secondary | ICD-10-CM | POA: Diagnosis present

## 2024-01-16 DIAGNOSIS — S6292XB Unspecified fracture of left wrist and hand, initial encounter for open fracture: Secondary | ICD-10-CM | POA: Diagnosis not present

## 2024-01-16 DIAGNOSIS — M1812 Unilateral primary osteoarthritis of first carpometacarpal joint, left hand: Secondary | ICD-10-CM | POA: Diagnosis not present

## 2024-01-16 DIAGNOSIS — Y92410 Unspecified street and highway as the place of occurrence of the external cause: Secondary | ICD-10-CM | POA: Diagnosis not present

## 2024-01-16 DIAGNOSIS — M19042 Primary osteoarthritis, left hand: Secondary | ICD-10-CM | POA: Diagnosis not present

## 2024-01-16 DIAGNOSIS — S62337A Displaced fracture of neck of fifth metacarpal bone, left hand, initial encounter for closed fracture: Secondary | ICD-10-CM | POA: Diagnosis not present

## 2024-01-16 MED ORDER — HYDROCODONE-ACETAMINOPHEN 5-325 MG PO TABS
1.0000 | ORAL_TABLET | Freq: Four times a day (QID) | ORAL | Status: DC | PRN
  Filled 2024-01-16: qty 1

## 2024-01-16 MED ORDER — CEPHALEXIN 500 MG PO CAPS: 500.0000 mg | ORAL_CAPSULE | Freq: Four times a day (QID) | ORAL | 0 refills | Status: AC

## 2024-01-16 MED ORDER — CEPHALEXIN 250 MG PO CAPS
500.0000 mg | ORAL_CAPSULE | Freq: Once | ORAL | Status: AC
  Administered 2024-01-16: 500 mg via ORAL
  Filled 2024-01-16: qty 2

## 2024-01-16 MED ORDER — LIDOCAINE HCL (PF) 1 % IJ SOLN
10.0000 mL | Freq: Once | INTRAMUSCULAR | Status: AC
  Administered 2024-01-16: 10 mL
  Filled 2024-01-16: qty 10

## 2024-01-16 NOTE — Discharge Instructions (Addendum)
 Please follow-up outpatient with hand surgery. Take Tylenol  and Motrin for pain control.
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
## 2024-01-16 NOTE — ED Notes (Signed)
 Ring removed from left hand and given to patient. Ring is in patient's possession.

## 2024-01-16 NOTE — ED Notes (Signed)
 RN reviewed discharge instructions with pt. Pt verbalized understanding and had no further questions. VSS upon discharge.

## 2024-01-16 NOTE — ED Notes (Signed)
 Patients wound irrigated with sterile water. Dermabond and suture cart at bedside.
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
## 2024-01-16 NOTE — Consult Note (Signed)
 Called Dr. Jonna Netter for hand surgery consult Burdette Carolin)

## 2024-01-18 DIAGNOSIS — M9904 Segmental and somatic dysfunction of sacral region: Secondary | ICD-10-CM | POA: Diagnosis not present

## 2024-01-18 DIAGNOSIS — M25551 Pain in right hip: Secondary | ICD-10-CM | POA: Diagnosis not present

## 2024-01-18 DIAGNOSIS — M9905 Segmental and somatic dysfunction of pelvic region: Secondary | ICD-10-CM | POA: Diagnosis not present

## 2024-01-18 DIAGNOSIS — M9908 Segmental and somatic dysfunction of rib cage: Secondary | ICD-10-CM | POA: Diagnosis not present

## 2024-01-18 DIAGNOSIS — M5459 Other low back pain: Secondary | ICD-10-CM | POA: Diagnosis not present

## 2024-01-18 DIAGNOSIS — M9906 Segmental and somatic dysfunction of lower extremity: Secondary | ICD-10-CM | POA: Diagnosis not present

## 2024-01-18 DIAGNOSIS — M25552 Pain in left hip: Secondary | ICD-10-CM | POA: Diagnosis not present

## 2024-01-18 DIAGNOSIS — M9903 Segmental and somatic dysfunction of lumbar region: Secondary | ICD-10-CM | POA: Diagnosis not present

## 2024-01-18 DIAGNOSIS — M62838 Other muscle spasm: Secondary | ICD-10-CM | POA: Diagnosis not present

## 2024-01-24 DIAGNOSIS — S62337A Displaced fracture of neck of fifth metacarpal bone, left hand, initial encounter for closed fracture: Secondary | ICD-10-CM | POA: Diagnosis not present

## 2024-01-26 ENCOUNTER — Ambulatory Visit: Admit: 2024-01-26 | Discharge: 2024-01-26 | Payer: MEDICARE | Attending: Internal Medicine | Primary: Internal Medicine

## 2024-01-26 DIAGNOSIS — R413 Other amnesia: Secondary | ICD-10-CM

## 2024-02-02 ENCOUNTER — Encounter

## 2024-02-02 LAB — CBC WITH AUTO DIFFERENTIAL
Basophils %: 0.5 % (ref 0.0–2.0)
Basophils Absolute: 0 10*3/uL (ref 0.0–0.2)
Eosinophils %: 1.9 % (ref 0.0–7.0)
Eosinophils Absolute: 0.1 10*3/uL (ref 0.0–0.5)
Hematocrit: 37.6 % (ref 34.0–47.0)
Hemoglobin: 12.1 g/dL (ref 11.5–15.7)
Immature Grans (Abs): 0.02 10*3/uL (ref 0.00–0.06)
Immature Granulocytes %: 0.3 % (ref 0.0–0.6)
Lymphocytes Absolute: 1.9 10*3/uL (ref 1.0–3.2)
Lymphocytes: 29.7 % (ref 15.0–45.0)
MCH: 30.4 pg (ref 27.0–34.5)
MCHC: 32.2 g/dL (ref 30.0–36.0)
MCV: 94.5 fL (ref 81.0–99.0)
MPV: 9.6 fL (ref 7.0–12.2)
Monocytes %: 12.3 % — ABNORMAL HIGH (ref 4.0–12.0)
Monocytes Absolute: 0.8 10*3/uL (ref 0.3–1.0)
NRBC Absolute: 0 10*3/uL (ref 0.000–0.012)
NRBC Automated: 0 % (ref 0.0–0.2)
Neutrophils %: 55.3 % (ref 42.0–74.0)
Neutrophils Absolute: 3.5 10*3/uL (ref 1.6–7.3)
Platelets: 303 10*3/uL (ref 140–440)
RBC: 3.98 x10e6/mcL (ref 3.60–5.20)
RDW: 14.7 % (ref 10.0–17.0)
WBC: 6.3 10*3/uL (ref 3.8–10.6)

## 2024-02-02 LAB — COMPREHENSIVE METABOLIC PANEL
ALT: 38 U/L (ref 0–42)
AST: 38 U/L (ref 0–46)
Albumin/Globulin Ratio: 2 (ref 1.00–2.70)
Albumin: 4.4 g/dL (ref 3.5–5.2)
Alk Phosphatase: 81 U/L (ref 35–117)
Anion Gap: 11 mmol/L (ref 2–17)
BUN: 18 mg/dL (ref 8–23)
CO2: 31 mmol/L — ABNORMAL HIGH (ref 22–29)
Calcium: 9.9 mg/dL (ref 8.5–10.7)
Chloride: 100 mmol/L (ref 98–107)
Creatinine: 0.8 mg/dL (ref 0.5–1.0)
Est, Glom Filt Rate: 75 mL/min/1.73mÂ² (ref >=60)
Globulin: 2.2 g/dL (ref 1.9–4.4)
Glucose: 94 mg/dL (ref 70–99)
Osmolaliy Calculated: 285 mosm/kg (ref 270–287)
Potassium: 4.1 mmol/L (ref 3.5–5.3)
Sodium: 142 mmol/L (ref 135–145)
Total Bilirubin: 0.4 mg/dL (ref 0.00–1.20)
Total Protein: 6.6 g/dL (ref 5.7–8.3)

## 2024-02-02 LAB — LIPID PANEL
Chol/HDL Ratio: 2.5 (ref 0.0–4.4)
Cholesterol, Total: 212 mg/dL — ABNORMAL HIGH (ref 100–200)
HDL: 85 mg/dL (ref >=50)
LDL Cholesterol: 115.2 mg/dL — ABNORMAL HIGH (ref 0.0–100.0)
LDL/HDL Ratio: 1.4
Triglycerides: 59 mg/dL (ref 0–149)
VLDL: 11.8 mg/dL (ref 5.0–40.0)

## 2024-02-02 LAB — CULTURE, URINE: FINAL REPORT: NO GROWTH

## 2024-02-02 LAB — VITAMIN D 25 HYDROXY: Vit D, 25-Hydroxy: 47.9 ng/mL (ref 30.0–90.0)

## 2024-02-02 LAB — VITAMIN B12: Vitamin B-12: 1970 pg/mL — ABNORMAL HIGH (ref 232–1245)

## 2024-02-02 LAB — TSH REFLEX TO FT4: TSH: 9.87 u[IU]/mL — ABNORMAL HIGH (ref 0.358–3.740)

## 2024-02-02 LAB — T4, FREE: T4 Free: 1.19 ng/dL (ref 0.82–1.70)

## 2024-02-03 LAB — ANA SCREEN WITH REFLEX
ANA SCREEN: NEGATIVE
ENA Sceen Interp: NEGATIVE
dsDNA Interp: NEGATIVE

## 2024-02-03 LAB — RPR: RPR: NONREACTIVE

## 2024-02-24 ENCOUNTER — Encounter

## 2024-02-24 ENCOUNTER — Inpatient Hospital Stay: Admit: 2024-02-24 | Payer: MEDICARE | Attending: Internal Medicine | Primary: Internal Medicine

## 2024-02-24 DIAGNOSIS — R413 Other amnesia: Principal | ICD-10-CM

## 2024-02-26 NOTE — Telephone Encounter (Signed)
 Refused, sent by Edi Surescripts

## 2024-02-28 ENCOUNTER — Encounter: Attending: Internal Medicine | Primary: Internal Medicine

## 2024-02-29 NOTE — Progress Notes (Signed)
 CHIEF COMPLAINT:  Chief Complaint   Patient presents with    Other     Urinary frequency and lack of control  Pain in the stomach  Patient does not sleep  Brain fogginess         HISTORY OF PRESENT ILLNESS:  [name] is a 78 y.o. female  here for above issues  She is noting increased urinary incontinence, has leaks when bladder is full and if rushing to bathroom  Has chronic isnomnia  Feels less sharp mentally  Her husband is very concerned about her memory  History of Present Illness         PHQ:      01/26/2024    12:04 PM   PHQ-9    Little interest or pleasure in doing things 1   Feeling down, depressed, or hopeless 0   Trouble falling or staying asleep, or sleeping too much 2   Feeling tired or having little energy 2   Poor appetite or overeating 0   Feeling bad about yourself - or that you are a failure or have let yourself or your family down 0   Trouble concentrating on things, such as reading the newspaper or watching television 2   Moving or speaking so slowly that other people could have noticed. Or the opposite - being so fidgety or restless that you have been moving around a lot more than usual 0   Thoughts that you would be better off dead, or of hurting yourself in some way 0   PHQ-2 Score 1    PHQ-9 Total Score 7    If you checked off any problems, how difficult have these problems made it for you to do your work, take care of things at home, or get along with other people? 0       Patient-reported       Past Medical History:   Diagnosis Date    Anemia     Arthritis     Bleeding ulcer     DDD (degenerative disc disease), lumbar     History of blood transfusion     Hypertension     Hypothyroidism     Osteoporosis     Restless legs syndrome     Many years ago    Scoliosis       Past Surgical History:   Procedure Laterality Date    JOINT REPLACEMENT Left 08/2014    KNEE ARTHROSCOPY Left 2010    NOSE SURGERY      RESET AFTER BEING BROKEN    TOTAL HIP ARTHROPLASTY  08/23/2015    TOTAL KNEE ARTHROPLASTY  Right 04/2018    TUBAL LIGATION        Social History     Socioeconomic History    Marital status: Married     Spouse name: None    Number of children: None    Years of education: None    Highest education level: None   Tobacco Use    Smoking status: Never    Smokeless tobacco: Never   Vaping Use    Vaping status: Never Used   Substance and Sexual Activity    Alcohol use: Yes     Alcohol/week: 3.0 standard drinks of alcohol     Types: 3 Glasses of wine per week    Drug use: Never    Sexual activity: Yes     Partners: Male     Social Drivers of Health     Physical Activity: Unknown (  02/20/2023)    Exercise Vital Sign     Days of Exercise per Week: 0 days   Recent Concern: Physical Activity - Inactive (02/20/2023)    Exercise Vital Sign     Days of Exercise per Week: 0 days     Minutes of Exercise per Session: 30 min       Current Outpatient Medications   Medication Instructions    acetaminophen (TYLENOL) 650 MG extended release tablet No dose, route, or frequency recorded.    DULoxetine (CYMBALTA) 60 mg, DAILY    levothyroxine  (SYNTHROID ) 50 mcg, Oral, DAILY    magnesium oxide (MAG-OX) 500 mg    Multiple Vitamin (MULTIVITAMIN ADULT PO)     pramipexole  (MIRAPEX ) 1.5 mg, Oral, EVERY EVENING, 1.5 TABLETS AT BEDTIME ORALLY 90 DAYS    traZODone (DESYREL) 100 MG tablet TAKE 2 TABLETS BY MOUTH EVERY DAY AT BEDTIME    vitamin B-12 (CYANOCOBALAMIN) 1,000 mcg, DAILY    vitamin B-6 (PYRIDOXINE) 100 mg, DAILY    zoster recombinant adjuvanted vaccine (SHINGRIX) 50 MCG/0.5ML SUSR injection Pt only got the first dose not the 2nd yet             Review of Systems   Constitutional:  Positive for fatigue. Negative for chills and fever.   HENT:  Negative for congestion.    Respiratory:  Negative for cough and shortness of breath.    Cardiovascular:  Negative for chest pain and palpitations.   Gastrointestinal:  Positive for abdominal pain. Negative for constipation, diarrhea, nausea and vomiting.   Genitourinary:  Positive for frequency.    Musculoskeletal:  Negative for arthralgias and myalgias.   Neurological:  Negative for weakness.   Psychiatric/Behavioral:  Positive for sleep disturbance. Negative for confusion and dysphoric mood.         BP 118/72   Pulse 74   Ht 1.613 m (5' 3.5)   Wt 57.8 kg (127 lb 6.4 oz)   SpO2 96%   BMI 22.21 kg/m     Objective   Physical Exam  Constitutional:       Appearance: Normal appearance.   HENT:      Head: Normocephalic and atraumatic.   Cardiovascular:      Rate and Rhythm: Normal rate and regular rhythm.      Heart sounds: Normal heart sounds.   Pulmonary:      Effort: Pulmonary effort is normal.      Breath sounds: Normal breath sounds. No wheezing.   Abdominal:      Palpations: Abdomen is soft.      Tenderness: There is no abdominal tenderness.   Musculoskeletal:         General: Normal range of motion.      Cervical back: Normal range of motion and neck supple.   Skin:     General: Skin is warm and dry.   Neurological:      General: No focal deficit present.      Mental Status: She is alert.   Psychiatric:         Mood and Affect: Mood normal.         Behavior: Behavior normal.         Thought Content: Thought content normal.         Judgment: Judgment normal.           Physical Exam         Lab Results   Component Value Date    WBC 6.3 02/02/2024  HGB 12.1 02/02/2024    HCT 37.6 02/02/2024    MCV 94.5 02/02/2024    PLT 303 02/02/2024     Lab Results   Component Value Date/Time    NA 142 02/02/2024 09:22 AM    K 4.1 02/02/2024 09:22 AM    CL 100 02/02/2024 09:22 AM    CO2 31 02/02/2024 09:22 AM    BUN 18 02/02/2024 09:22 AM    CREATININE 0.8 02/02/2024 09:22 AM    GLUCOSE 94 02/02/2024 09:22 AM    CALCIUM 9.9 02/02/2024 09:22 AM      Lab Results   Component Value Date    CHOL 212 (H) 02/02/2024    CHOL 199 08/31/2023    CHOL 200 02/24/2023     Lab Results   Component Value Date    TRIG 59 02/02/2024    TRIG 57 08/31/2023    TRIG 57 02/24/2023     Lab Results   Component Value Date    HDL 85  02/02/2024    HDL 89 08/31/2023    HDL 67 02/24/2023     No components found for: LDLCHOLESTEROL, LDLCALC  Lab Results   Component Value Date    VLDL 11.8 02/02/2024    VLDL 11.4 08/31/2023    VLDL 11.4 02/24/2023     Lab Results   Component Value Date    CHOLHDLRATIO 2.5 02/02/2024    CHOLHDLRATIO 2.2 08/31/2023    CHOLHDLRATIO 3.0 02/24/2023     No results found for: PSA  Lab Results   Component Value Date    TSH 9.870 (H) 02/02/2024       IMPRESSION/PLAN  1. Memory loss  -     Vitamin B12; Future  -     Culture, Urine; Future  -     ANA Screen With Reflex; Future  -     RPR; Future  -     MRI BRAIN WO CONTRAST; Future  -     [facility] - Neuropsychology  2. Other fatigue  -     CBC with Auto Differential; Future  -     MRI BRAIN WO CONTRAST; Future  3. Acquired hypothyroidism  -     TSH reflex to FT4; Future  4. Hypervitaminosis D  -     Vitamin D 25 Hydroxy; Future  5. Dyslipidemia  -     Comprehensive Metabolic Panel; Future  -     Lipid Panel; Future    Recommend dementia work up including urine culture to r/o UTI  See orders above  Assessment & Plan        Follow up and Dispositions:  Return as scheduled.     The patient (or guardian, if applicable) and other individuals in attendance with the patient were advised that Artificial Intelligence will be utilized during this visit to record, process the conversation to generate a clinical note, and support improvement of the AI technology. The patient (or guardian, if applicable) and other individuals in attendance at the appointment consented to the use of AI, including the recording.         Devere Leghorn, MD     An electronic signature was used to authenticate this note.

## 2024-03-01 DIAGNOSIS — S99919A Unspecified injury of unspecified ankle, initial encounter: Secondary | ICD-10-CM | POA: Diagnosis not present

## 2024-03-01 DIAGNOSIS — M79671 Pain in right foot: Secondary | ICD-10-CM | POA: Diagnosis not present

## 2024-03-01 DIAGNOSIS — M25571 Pain in right ankle and joints of right foot: Secondary | ICD-10-CM | POA: Diagnosis not present

## 2024-03-05 DIAGNOSIS — S92351A Displaced fracture of fifth metatarsal bone, right foot, initial encounter for closed fracture: Secondary | ICD-10-CM | POA: Diagnosis not present

## 2024-03-15 ENCOUNTER — Ambulatory Visit: Admit: 2024-03-15 | Discharge: 2024-03-15 | Payer: MEDICARE | Attending: Internal Medicine | Primary: Internal Medicine

## 2024-03-15 DIAGNOSIS — Z0000 Encounter for general adult medical examination without abnormal findings: Principal | ICD-10-CM

## 2024-03-15 MED ORDER — PRAMIPEXOLE DIHYDROCHLORIDE 1 MG PO TABS
1 | ORAL_TABLET | Freq: Every evening | ORAL | 3 refills | 60.00000 days | Status: AC
Start: 2024-03-15 — End: ?

## 2024-03-15 MED ORDER — LEVOTHYROXINE SODIUM 50 MCG PO TABS
50 | ORAL_TABLET | Freq: Every day | ORAL | 3 refills | 90.00000 days | Status: DC
Start: 2024-03-15 — End: 2024-09-16

## 2024-03-15 NOTE — Progress Notes (Signed)
 CHIEF COMPLAINT:  Chief Complaint   Patient presents with    Medicare AWV    Medication Refill        HISTORY OF PRESENT ILLNESS:  Diana [name] is a 78 y.o. female    History of Present Illness  The patient presents for a wellness visit.    She reports experiencing significant sleep disturbances, which have been progressively worsening over the past few months. She is uncertain if this is due to insufficient sleep or another underlying issue. Over the past 2 to 3 weeks, she has been feeling extremely fatigued by mid-morning, often needing to return to bed. For instance, last Saturday, she woke up, started her morning routine, but had to go back to bed and did not wake up until 5:00 PM. This fatigue is also affecting her work, where she struggles to stay awake. 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 Her daughter has been having a difficult year, with frequent hospital visits due to various health issues, including a hip problem that required surgery 2 weeks ago. This has increased the care and time required from her. Over the past year, she has been unable to get to bed before 11:30 PM and often wakes up early to assist her daughter before the caregiver arrives. She is considering a family meeting to discuss potential solutions. Her husband is concerned about her health, as she has never needed daytime naps before.    She is also experiencing balance issues, feeling unsteady and needing assistance to walk to the bathroom. Despite these challenges, she maintains a good appetite and regular bowel movements. She takes her thyroid medication on an empty stomach in the morning.    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 Experiences extreme fatigue by mid-morning and has needed to return to  bed.       PHQ:      03/15/2024     2:43 PM   PHQ-9    Little interest or pleasure in doing things 0   Feeling down, depressed, or hopeless 0   Trouble falling or staying asleep, or sleeping too much 0   Feeling tired or having little energy 0   Poor appetite or overeating 0   Feeling bad about yourself - or that you are a failure or have let yourself or your family down 0   Trouble concentrating on things, such as reading the newspaper or watching television 0   Moving or speaking so slowly that other people could have noticed. Or the opposite - being so fidgety or restless that you have been moving around a lot more than usual 0   Thoughts that you would be better off dead, or of hurting yourself in some way 0   PHQ-2 Score 0   PHQ-9 Total Score 0   If you checked off any problems, how difficult have these problems made it for you to do your work, take care of things at home, or get along with other people? 0       Past Medical History:   Diagnosis Date    Anemia     Arthritis     Bleeding ulcer     DDD (degenerative disc disease), lumbar     History of blood transfusion     Hypertension  Hypothyroidism     Osteoporosis     Restless legs syndrome     Many years ago    Scoliosis       Past Surgical History:   Procedure Laterality Date    JOINT REPLACEMENT Left 08/2014    KNEE ARTHROSCOPY Left 2010    NOSE SURGERY      RESET AFTER BEING BROKEN    TOTAL HIP ARTHROPLASTY  08/23/2015    TOTAL KNEE ARTHROPLASTY Right 04/2018    TUBAL LIGATION        Social History     Socioeconomic History    Marital status: Married     Spouse name: None    Number of children: None    Years of education: None    Highest education level: None   Tobacco Use    Smoking status: Never    Smokeless tobacco: Never   Vaping Use    Vaping status: Never Used   Substance and Sexual Activity    Alcohol use: Yes     Alcohol/week: 3.0 standard drinks of alcohol     Types: 3 Glasses of wine per week    Drug use: Never    Sexual activity: Yes      Partners: Male     Social Drivers of Health     Physical Activity: Inactive (03/14/2024)    Exercise Vital Sign     Days of Exercise per Week: 0 days     Minutes of Exercise per Session: 10 min       Current Outpatient Medications   Medication Instructions    acetaminophen (TYLENOL) 650 MG extended release tablet No dose, route, or frequency recorded.    DULoxetine (CYMBALTA) 60 mg, DAILY    levothyroxine  (SYNTHROID ) 50 mcg, Oral, DAILY    magnesium oxide (MAG-OX) 500 mg    Multiple Vitamin (MULTIVITAMIN ADULT PO)     pramipexole  (MIRAPEX ) 1.5 mg, Oral, EVERY EVENING, 1.5 TABLETS AT BEDTIME ORALLY 90 DAYS    traZODone (DESYREL) 100 MG tablet TAKE 2 TABLETS BY MOUTH EVERY DAY AT BEDTIME    vitamin B-12 (CYANOCOBALAMIN) 1,000 mcg, DAILY    vitamin B-6 (PYRIDOXINE) 100 mg, DAILY             Review of Systems     BP 112/74   Pulse 71   Temp 98.6 F (37 C) (Oral)   Resp 18   Ht 1.613 m (5' 3.5)   Wt 58.5 kg (129 lb)   SpO2 96%   BMI 22.49 kg/m     Objective   Physical Exam      Physical Exam  Neck: No bruits, neck feels normal  Respiratory: Clear to auscultation, no wheezing, rales or rhonchi  Cardiovascular: Regular rate and rhythm, no murmurs, rubs, or gallops  Gastrointestinal: Soft, no tenderness, no distention, no masses  Extremities: No swelling in the ankles       Lab Results   Component Value Date    WBC 6.3 02/02/2024    HGB 12.1 02/02/2024    HCT 37.6 02/02/2024    MCV 94.5 02/02/2024    PLT 303 02/02/2024     Lab Results   Component Value Date/Time    NA 142 02/02/2024 09:22 AM    K 4.1 02/02/2024 09:22 AM    CL 100 02/02/2024 09:22 AM    CO2 31 02/02/2024 09:22 AM    BUN 18 02/02/2024 09:22 AM    CREATININE 0.8 02/02/2024 09:22 AM    GLUCOSE  94 02/02/2024 09:22 AM    CALCIUM 9.9 02/02/2024 09:22 AM      Lab Results   Component Value Date    CHOL 212 (H) 02/02/2024    CHOL 199 08/31/2023    CHOL 200 02/24/2023     Lab Results   Component Value Date    TRIG 59 02/02/2024    TRIG 57 08/31/2023    TRIG  57 02/24/2023     Lab Results   Component Value Date    HDL 85 02/02/2024    HDL 89 08/31/2023    HDL 67 02/24/2023     No components found for: LDLCHOLESTEROL, LDLCALC  Lab Results   Component Value Date    VLDL 11.8 02/02/2024    VLDL 11.4 08/31/2023    VLDL 11.4 02/24/2023     Lab Results   Component Value Date    CHOLHDLRATIO 2.5 02/02/2024    CHOLHDLRATIO 2.2 08/31/2023    CHOLHDLRATIO 3.0 02/24/2023     No results found for: PSA  Lab Results   Component Value Date    TSH 9.870 (H) 02/02/2024       IMPRESSION/PLAN  1. Medicare annual wellness visit, subsequent  2. Restless leg syndrome  Comments:  Continue Mirapex   Orders:  -     pramipexole  (MIRAPEX ) 1 MG tablet; Take 1.5 tablets by mouth every evening 1.5 TABLETS AT BEDTIME ORALLY 90 DAYS, Disp-135 tablet, R-3Normal  3. Acquired hypothyroidism  Comments:  Chronic, controlled, continue current medication  Orders:  -     levothyroxine  (SYNTHROID ) 50 MCG tablet; Take 1 tablet by mouth daily, Disp-90 tablet, R-3Normal  -     TSH reflex to FT4; Future  4. Other fatigue  -     TSH reflex to FT4; Future  -     CBC with Auto Differential; Future  -     Comprehensive Metabolic Panel; Future    Assessment & Plan  1. Wellness visit.  - Her weight has always been low, so any weight gain is considered beneficial. Recent lab results from 01/2024 indicate normal kidney function, electrolyte levels, and B12 levels.  - There is a slight increase in cholesterol, but the HDL levels remain high. Liver function is satisfactory. Thyroid hormone (T4) levels are within the normal range, but there is a noted increase in TSH levels. Vitamin D levels and blood counts are also within normal limits.  - A brain scan revealed moderate chronic microangiopathic changes, which are age-appropriate.  - She is advised to maintain a healthy diet and continue her current medication regimen, including her thyroid medication. Refills for Mirapex  and levothyroxine  will be provided. She is  encouraged to ensure adequate rest and sleep, including taking naps when necessary.    2. Sleep disturbance.  - She reports significant issues with sleep, feeling extremely tired by mid-morning and needing to return to bed. This has been progressively worsening over the past few months.  - She is advised to try going to bed earlier, possibly by 10:00 PM, and to take naps when necessary to compensate for inadequate nighttime sleep.  - A family meeting is suggested to discuss potential adjustments in caregiving responsibilities to allow for more rest.  - Consideration of having caregivers come earlier or overnight to assist with her daughter's needs.    3. Balance issues.  - She reports episodes of balance disturbances, particularly in the mornings. This may be related to her lack of sleep.  - Physical exam findings include clear  lungs, normal carotid arteries, regular heart rhythm, good bowel sounds, and no swelling in the ankles.  - She is advised to ensure adequate rest and consider discussing caregiving adjustments with her family to reduce fatigue.  - Encouraged to monitor balance and seek assistance when needed.    4. Elevated TSH.  - Her TSH levels have been fluctuating, with a recent increase noted.  - She is currently taking her thyroid medication on an empty stomach in the morning.  - Continued monitoring of her thyroid function is recommended. Repeat labs will be ordered in 6 months to reassess thyroid function.  - Refills for levothyroxine  will be provided.    5. Elevated cholesterol.  - Her recent labs indicate a slight increase in cholesterol levels.  - She is advised to continue monitoring her diet and cholesterol levels.  - Repeat cholesterol labs will be ordered in 6 months.  - Encouraged to maintain a healthy diet and monitor lipid levels.    Follow-up: A follow-up visit is scheduled in 6 months for repeat labs and further evaluation.      Follow up and Dispositions:  Return in about 6 months (around  09/15/2024) for f/u thyroid.     The patient (or guardian, if applicable) and other individuals in attendance with the patient were advised that Artificial Intelligence will be utilized during this visit to record, process the conversation to generate a clinical note, and support improvement of the AI technology. The patient (or guardian, if applicable) and other individuals in attendance at the appointment consented to the use of AI, including the recording.         Devere Leghorn, MD     An electronic signature was used to authenticate this note.Medicare Annual Wellness Visit    Diana [name] is here for Medicare AWV and Medication Refill    Assessment & Plan   Medicare annual wellness visit, subsequent  Restless leg syndrome  Comments:  Continue Mirapex   Orders:  -     pramipexole  (MIRAPEX ) 1 MG tablet; Take 1.5 tablets by mouth every evening 1.5 TABLETS AT BEDTIME ORALLY 90 DAYS, Disp-135 tablet, R-3Normal  Acquired hypothyroidism  Comments:  Chronic, controlled, continue current medication  Orders:  -     levothyroxine  (SYNTHROID ) 50 MCG tablet; Take 1 tablet by mouth daily, Disp-90 tablet, R-3Normal  -     TSH reflex to FT4; Future  Other fatigue  -     TSH reflex to FT4; Future  -     CBC with Auto Differential; Future  -     Comprehensive Metabolic Panel; Future       Return in about 6 months (around 09/15/2024) for f/u thyroid.     Subjective       Patient's complete Health Risk Assessment and screening values have been reviewed and are found in Flowsheets. The following problems were reviewed today and where indicated follow up appointments were made and/or referrals ordered.    Positive Risk Factor Screenings with Interventions:    Fall Risk:  Do you feel unsteady or are you worried about falling? : (!) yes  2 or more falls in past year?: no  Fall with injury in past year?: no  Interventions:    Reviewed medications, home hazards, visual acuity, and co-morbidities that can increase risk for falls  See AVS for  additional education material            General HRA Questions:  Select all that apply: (!) New or  Increased Fatigue, Stress  Interventions Fatigue:  See AVS for additional education material  Interventions - Stress:  See AVS for additional education material      Inactivity:  On average, how many days per week do you engage in moderate to strenuous exercise (like a brisk walk)?: 0 days (!) Abnormal  On average, how many minutes do you engage in exercise at this level?: 10 min  Interventions:  See AVS for additional education material       Hearing Screen:  Do you or your family notice any trouble with your hearing that hasn't been managed with hearing aids?: (!) Yes    Interventions:  See AVS for additional education material       ADL's:   Patient reports needing help with:  Select all that apply: (!) Walking/Balance  Interventions:  See AVS for additional education material                  Objective   Vitals:    03/15/24 1446   BP: 112/74   Pulse: 71   Resp: 18   Temp: 98.6 F (37 C)   TempSrc: Oral   SpO2: 96%   Weight: 58.5 kg (129 lb)   Height: 1.613 m (5' 3.5)      Body mass index is 22.49 kg/m.                    Allergies   Allergen Reactions    2,4-D Dimethylamine Hallucinations    Amitriptyline Hcl      Other reaction(s): sleep walking    Aspirin      Other reaction(s): bleeding ulcers    Codeine      Other reaction(s): Unknown    Pseudoeph-Bromphen-Cod      Other reaction(s): Hallucinations    Pseudoephedrine      Prior to Visit Medications    Medication Sig Taking? Authorizing Provider   pramipexole  (MIRAPEX ) 1 MG tablet Take 1.5 tablets by mouth every evening 1.5 TABLETS AT BEDTIME ORALLY 90 DAYS Yes Alfreida Pulling, MD   levothyroxine  (SYNTHROID ) 50 MCG tablet Take 1 tablet by mouth daily Yes Makell Cyr, MD   DULoxetine (CYMBALTA) 60 MG extended release capsule Take 1 capsule by mouth daily Yes [provider]   Multiple Vitamin (MULTIVITAMIN ADULT PO)  Yes [provider]    vitamin B-6 (PYRIDOXINE) 100 MG tablet Take 1 tablet by mouth daily Yes [provider]   vitamin B-12 (CYANOCOBALAMIN) 1000 MCG tablet Take 1 tablet by mouth daily Yes [provider]   magnesium oxide (MAG-OX) 140 MG CAPS 500 mg Yes [provider]   acetaminophen (TYLENOL) 650 MG extended release tablet  Yes [provider]   traZODone (DESYREL) 100 MG tablet TAKE 2 TABLETS BY MOUTH EVERY DAY AT BEDTIME Yes [provider]       CareTeam (Including outside providers/suppliers regularly involved in providing care):   Patient Care Team:  Alfreida Pulling, MD as PCP - General (Internal Medicine)  Alfreida Pulling, MD as PCP - Empaneled Provider  Alfreida Pulling, MD     Recommendations for Preventive Services Due: see orders and patient instructions/AVS.  Recommended screening schedule for the next 5-10 years is provided to the patient in written form: see Patient Instructions/AVS.     Reviewed and updated this visit:  Tobacco  Allergies  Meds  Problems  Med Hx  Surg Hx  Fam Hx  Sexual   Hx

## 2024-03-15 NOTE — Patient Instructions (Signed)
 Preventing Falls: Care Instructions  Injuries and health problems such as trouble walking or poor eyesight can increase your risk of falling. So can some medicines. But there are things you can do to help prevent falls. You can exercise to get stronger. You can also arrange your home to make it safer.    Talk to your doctor about the medicines you take. Ask if any of them increase the risk of falls and whether they can be changed or stopped.   Try to exercise regularly. It can help improve your strength and balance. This can help lower your risk of falling.         Practice fall safety and prevention.   Wear low-heeled shoes that fit well and give your feet good support. Talk to your doctor if you have foot problems that make this hard.  Carry a cellphone or wear a medical alert device that you can use to call for help.  Use stepladders instead of chairs to reach high objects. Don't climb if you're at risk for falls. Ask for help, if needed.  Wear the correct eyeglasses, if you need them.        Make your home safer.   Remove rugs, cords, clutter, and furniture from walkways.  Keep your house well lit. Use night-lights in hallways and bathrooms.  Install and use sturdy handrails on stairways.  Wear nonskid footwear, even inside. Don't walk barefoot or in socks without shoes.        Be safe outside.   Use handrails, curb cuts, and ramps whenever possible.  Keep your hands free by using a shoulder bag or backpack.  Try to walk in well-lit areas. Watch out for uneven ground, changes in pavement, and debris.  Be careful in the winter. Walk on the grass or gravel when sidewalks are slippery. Use de-icer on steps and walkways. Add non-slip devices to shoes.    Put grab bars and nonskid mats in your shower or tub and near the toilet. Try to use a shower chair or bath bench when bathing.   Get into a tub or shower by putting in your weaker leg first. Get out with your strong side first. Have a phone or medical alert  device in the bathroom with you.   Where can you learn more?  Go to [employer] and enter G117 to learn more about Preventing Falls: Care Instructions.  Current as of: March 22, 2023  Content Version: 14.5   [address], [place].   Care instructions adapted under license by [facility]. If you have questions about a medical condition or this instruction, always ask your healthcare professional. Romayne Alderman, [facility], disclaims any warranty or liability for your use of this information.         Fatigue: Care Instructions  Overview     Fatigue is a feeling of tiredness, exhaustion, or lack of energy. You may feel fatigue because of too much or not enough activity. It can also come from stress, lack of sleep, boredom, and poor diet. Many medical problems, such as viral infections, can cause fatigue. Emotional problems, especially depression, are often the cause of fatigue.  Fatigue is most often a symptom of another problem. Treatment for fatigue depends on the cause. For example, if you have fatigue because you have a certain health problem, treating this problem also treats your fatigue. If depression or anxiety is the cause, treatment may help.  Follow-up care is a key part of your treatment and  safety. Be sure to make and go to all appointments, and call your doctor if you are having problems. It's also a good idea to know your test results and keep a list of the medicines you take.  How can you care for yourself at home?  Get regular exercise. But try not to overdo it. It may help to go back and forth between rest and exercise.  Get plenty of rest.  Eat a variety of healthy foods. Try not to skip any meals.  Avoid or try to cut back on your use of caffeine, tobacco, and alcohol. Caffeine is most often found in coffee, tea, cola drinks, and energy drinks.  Limit medicines that can cause fatigue. These include medicines such as cold and allergy medicines.  When should you  call for help?  Watch closely for changes in your health, and be sure to contact your doctor if:    You have new symptoms such as fever or a rash.     Your fatigue gets worse.     You have been feeling down, depressed, or hopeless. Or you may have lost interest in things that you usually enjoy.     You are not getting better as expected.   Where can you learn more?  Go to [employer] and enter [phone] to learn more about Fatigue: Care Instructions.  Current as of: March 22, 2023  Content Version: 14.5   [address] [place].   Care instructions adapted under license by [facility]. If you have questions about a medical condition or this instruction, always ask your healthcare professional. Romayne Alderman, [facility], disclaims any warranty or liability for your use of this information.         Learning About Stress  What is stress?     Stress is your body's response to a hard situation. Your body can have a physical, emotional, or mental response. Stress is a fact of life for most people, and it affects everyone differently. What causes stress for you may not be stressful for someone else.  A lot of things can cause stress. You may feel stress when you go on a job interview, take a test, or run a race. This kind of short-term stress is normal and even useful. It can help you if you need to work hard or react quickly. For example, stress can help you finish an important job on time.  Long-term stress is caused by ongoing stressful situations or events. Examples of long-term stress include long-term health problems, ongoing problems at work, or conflicts in your family. Long-term stress can harm your health.  How does stress affect your health?  When you are stressed, your body responds as though you are in danger. It makes hormones that speed up your heart, make you breathe faster, and give you a burst of energy. This is called the fight-or-flight stress response. If the stress is  over quickly, your body goes back to normal and no harm is done.  But if stress happens too often or lasts too long, it can have bad effects. Long-term stress can make you more likely to get sick, and it can make symptoms of some diseases worse. If you tense up when you are stressed, you may develop neck, shoulder, or low back pain. Stress is linked to high blood pressure and heart disease.  Stress also harms your emotional health. It can make you moody, tense, or depressed. Your relationships may suffer, and you may not do well  at work or school.  What can you do to manage stress?  You can try these things to help manage stress:   Do something active. Exercise or activity can help reduce stress. Walking is a great way to get started. Even everyday activities such as housecleaning or yard work can help.  Try yoga or tai chi. These techniques combine exercise and meditation. You may need some training at first to learn them.  Do something you enjoy. For example, listen to music or go to a movie. Practice your hobby or do volunteer work.  Meditate. This can help you relax, because you are not worrying about what happened before or what may happen in the future.  Do guided imagery. Imagine yourself in any setting that helps you feel calm. You can use online videos, books, or a teacher to guide you.  Do breathing exercises. For example:  From a standing position, bend forward from the waist with your knees slightly bent. Let your arms dangle close to the floor.  Breathe in slowly and deeply as you return to a standing position. Roll up slowly and lift your head last.  Hold your breath for just a few seconds in the standing position.  Breathe out slowly and bend forward from the waist.  Let your feelings out. Talk, laugh, cry, and express anger when you need to. Talking with supportive friends or family, a Veterinary surgeon, or a faith leader about your feelings is a healthy way to relieve stress. Avoid discussing your feelings  with people who make you feel worse.  Write. It may help to write about things that are bothering you. This helps you find out how much stress you feel and what is causing it. When you know this, you can find better ways to cope.  What can you do to prevent stress?  You might try some of these things to help prevent stress:  Manage your time. This helps you find time to do the things you want and need to do.  Get enough sleep. Your body recovers from the stresses of the day while you are sleeping.  Get support. Your family, friends, and community can make a difference in how you experience stress.  Limit your news feed. Avoid or limit time on social media or news that may make you feel stressed.  Do something active. Exercise or activity can help reduce stress. Walking is a great way to get started.  Where can you learn more?  Go to [employer] and enter N032 to learn more about Learning About Stress.  Current as of: June 15, 2023  Content Version: 14.5   [address], [place].   Care instructions adapted under license by [facility]. If you have questions about a medical condition or this instruction, always ask your healthcare professional. Romayne Alderman, [facility], disclaims any warranty or liability for your use of this information.         Learning About Being Active as an Older Adult  Why is being active important as you get older?     Being active is one of the best things you can do for your health. And it's never too late to start. Being active--or getting active, if you aren't already--has definite benefits. It can:  Give you more energy,  Keep your mind sharp.  Improve balance to reduce your risk of falls.  Help you manage chronic illness with fewer medicines.  No matter how old you are, how fit you are, or what  health problems you have, there is a form of activity that will work for you. And the more physical activity you can do, the better your overall health  will be.  What kinds of activity can help you stay healthy?  Being more active will make your daily activities easier. Physical activity includes planned exercise and things you do in daily life. There are four types of activity:  Aerobic.  Doing aerobic activity makes your heart and lungs strong.  Includes walking, dancing, and gardening.  Aim for at least 2 hours spread throughout the week.  It improves your energy and can help you sleep better.  Muscle-strengthening.  This type of activity can help maintain muscle and strengthen bones.  Includes climbing stairs, using resistance bands, and lifting or carrying heavy loads.  Aim for at least twice a week.  It can help protect the knees and other joints.  Stretching.  Stretching gives you better range of motion in joints and muscles.  Includes upper arm stretches, calf stretches, and gentle yoga.  Aim for at least twice a week, preferably after your muscles are warmed up from other activities.  It can help you function better in daily life.  Balancing.  This helps you stay coordinated and have good posture.  Includes heel-to-toe walking, tai chi, and certain types of yoga.  Aim for at least 3 days a week.  It can reduce your risk of falling.  Even if you have a hard time meeting the recommendations, it's better to be more active than less active. All activity done in each category counts toward your weekly total. You'd be surprised how daily things like carrying groceries, keeping up with grandchildren, and taking the stairs can add up.  What keeps you from being active?  If you've had a hard time being more active, you're not alone. Maybe you remember being able to do more. Or maybe you've never thought of yourself as being active. It's frustrating when you can't do the things you want. Being more active can help. What's holding you back?  Getting started.  Have a goal, but break it into easy tasks. Small steps build into big accomplishments.  Staying  motivated.  If you feel like skipping your activity, remember your goal. Maybe you want to move better and stay independent. Every activity gets you one step closer.  Not feeling your best.  Start with 5 minutes of an activity you enjoy. Prove to yourself you can do it. As you get comfortable, increase your time.  You may not be where you want to be. But you're in the process of getting there. Everyone starts somewhere.  How can you find safe ways to stay active?  Talk with your doctor about any physical challenges you're facing. Make a plan with your doctor if you have a health problem or aren't sure how to get started with activity.  If you're already active, ask your doctor if there is anything you should change to stay safe as your body and health change.  If you tend to feel dizzy after you take medicine, avoid activity at that time. Try being active before you take your medicine. This will reduce your risk of falls.  If you plan to be active at home, make sure to clear your space before you get started. Remove things like TV cords, coffee tables, and throw rugs. It's safest to have plenty of space to move freely.  The key to getting more active is to take  it slow and steady. Try to improve only a little bit at a time. Pick just one area to improve on at first. And if an activity hurts, stop and talk to your doctor.  Where can you learn more?  Go to [employer] and enter P600 to learn more about Learning About Being Active as an Older Adult.  Current as of: March 22, 2023  Content Version: 14.5   [address] [place].   Care instructions adapted under license by [facility]. If you have questions about a medical condition or this instruction, always ask your healthcare professional. Romayne Alderman, [facility], disclaims any warranty or liability for your use of this information.         Hearing Loss: Care Instructions  Overview     Hearing loss is a sudden or slow decrease in  how well you hear. It can range from slight to profound. Permanent hearing loss can occur with aging. It also can happen when you are exposed long-term to loud noise. Examples include listening to loud music, riding motorcycles, or being around other loud machines.  Hearing loss can affect your work and home life. It can make you feel lonely or depressed. You may feel that you have lost your independence. But hearing aids and other devices can help you hear better and feel connected to others.  Follow-up care is a key part of your treatment and safety. Be sure to make and go to all appointments, and call your doctor if you are having problems. It's also a good idea to know your test results and keep a list of the medicines you take.  How can you care for yourself at home?  Avoid loud noises whenever possible. This helps keep your hearing from getting worse.  Always wear hearing protection around loud noises.  Wear a hearing aid as directed.  A professional can help you pick a hearing aid that will work best for you.  You can also get hearing aids over the counter for mild to moderate hearing loss.  Have hearing tests as your doctor suggests. They can show whether your hearing has changed. Your hearing aid may need to be adjusted.  Use other devices as needed. These may include:  Telephone amplifiers and hearing aids that can connect to a television, stereo, radio, or microphone.  Devices that use lights or vibrations. These alert you to the doorbell, a ringing telephone, or a baby monitor.  Television closed-captioning. This shows the words at the bottom of the screen. Most new TVs can do this.  TTY (text telephone). This lets you type messages back and forth on the telephone instead of talking or listening. These devices are also called TDD. When messages are typed on the keyboard, they are sent over the phone line to a receiving TTY. The message is shown on a monitor.  Use text messaging, social media, and email  if it is hard for you to communicate by telephone.  Try to learn a listening technique called speechreading. It is not lipreading. You pay attention to people's gestures, expressions, posture, and tone of voice. These clues can help you understand what a person is saying. Face the person you are talking to, and have them face you. Make sure the lighting is good. You need to see the other person's face clearly.  Think about counseling if you need help to adjust to your hearing loss.  When should you call for help?  Watch closely for changes in your health,  and be sure to contact your doctor if:    You think your hearing is getting worse.     You have new symptoms, such as dizziness or nausea.   Where can you learn more?  Go to [employer] and enter R798 to learn more about Hearing Loss: Care Instructions.  Current as of: June 18, 2023  Content Version: 14.5   [address], [place].   Care instructions adapted under license by [facility]. If you have questions about a medical condition or this instruction, always ask your healthcare professional. Romayne Alderman, [facility], disclaims any warranty or liability for your use of this information.         Learning About Activities of Daily Living  What are activities of daily living?     Activities of daily living (ADLs) are the basic self-care tasks you do every day. These include eating, bathing, dressing, and moving around.  As you age, and if you have health problems, you may find that it's harder to do some of these tasks. If so, your doctor can suggest ideas that may help.  To measure what kind of help you may need, your doctor will ask how well you are able to do ADLs. Let your doctor know if there are any tasks that you are having trouble doing. This is an important first step to getting help. And when you have the help you need, you can stay as independent as possible.  How will a doctor assess your ADLs?  Asking about ADLs  is part of a routine health checkup your doctor will likely do as you age. Your health check might be done in a doctor's office, in your home, or at a hospital. The goal is to find out if you are having any problems that could make it hard to care for yourself or that make it unsafe for you to be on your own.  To measure your ADLs, your doctor will ask how hard it is for you to do routine tasks. Your doctor may also want to know if you have changed the way you do a task because of a health problem. Your doctor may watch how you:  Walk back and forth.  Keep your balance while you stand or walk.  Move from sitting to standing or from a bed to a chair.  Button or unbutton a [employer] fast streamer.  Remove and put on your shoes.  It's common to feel a little worried or anxious if you find you can't do all the things you used to be able to do. Talking with your doctor about ADLs is a way to make sure you're as safe as possible and able to care for yourself as well as you can. You may want to bring a caregiver, friend, or family member to your checkup. They can help you talk to your doctor.  Follow-up care is a key part of your treatment and safety. Be sure to make and go to all appointments, and call your doctor if you are having problems. It's also a good idea to know your test results and keep a list of the medicines you take.  Current as of: June 15, 2023  Content Version: 14.5   [address] [place].   Care instructions adapted under license by [facility] [place]. If you have questions about a medical condition or this instruction, always ask your healthcare professional. Romayne Alderman, [facility], disclaims any warranty or liability for your use of this information.  A Healthy Heart: Care Instructions  Overview     Coronary artery disease, also called heart disease, occurs when a substance called plaque builds up in the vessels that supply oxygen-rich blood to your heart muscle. This can narrow the blood  vessels and reduce blood flow. A heart attack happens when blood flow is completely blocked. A high-fat diet, smoking, and other factors increase the risk of heart disease.  Your doctor has found that you have a chance of having heart disease. A heart-healthy lifestyle can help keep your heart healthy and prevent heart disease. This lifestyle includes eating healthy, being active, staying at a weight that's healthy for you, and not smoking or using tobacco. It also includes taking medicines as directed, managing other health conditions, and trying to get a healthy amount of sleep.  Follow-up care is a key part of your treatment and safety. Be sure to make and go to all appointments, and call your doctor if you are having problems. It's also a good idea to know your test results and keep a list of the medicines you take.  How can you care for yourself at home?  Diet    Use less salt when you cook and eat. This helps lower your blood pressure. Taste food before salting. Add only a little salt when you think you need it. With time, your taste buds will adjust to less salt.     Eat fewer snack items, fast foods, canned soups, and other high-salt, high-fat, processed foods.     Read food labels and try to avoid saturated and trans fats. They increase your risk of heart disease by raising cholesterol levels.     Limit the amount of solid fat--butter, margarine, and shortening--you eat. Use olive, peanut, or canola oil when you cook. Bake, broil, and steam foods instead of frying them.     Eat a variety of fruit and vegetables every day. Dark green, deep orange, red, or yellow fruits and vegetables are especially good for you. Examples include spinach, carrots, peaches, and berries.     Foods high in fiber can reduce your cholesterol and provide important vitamins and minerals. High-fiber foods include whole-grain cereals and breads, oatmeal, beans, brown rice, citrus fruits, and apples.     Eat lean proteins.  Heart-healthy proteins include seafood, lean meats and poultry, eggs, beans, peas, nuts, seeds, and soy products.     Limit drinks and foods with added sugar. These include candy, desserts, and soda pop.   Heart-healthy lifestyle    If your doctor recommends it, get more exercise. For many people, walking is a good choice. Or you may want to swim, bike, or do other activities. Bit by bit, increase the time you're active every day. Try for at least 30 minutes on most days of the week.     Try to quit or cut back on using tobacco and other nicotine products. This includes smoking and vaping. If you need help quitting, talk to your doctor about stop-smoking programs and medicines. These can increase your chances of quitting for good. Quitting is one of the most important things you can do to protect your heart. It is never too late to quit. Try to avoid secondhand smoke too.     Stay at a weight that's healthy for you. Talk to your doctor if you need help losing weight.     Try to get 7 to 9 hours of sleep each night.     Limit alcohol to  2 drinks a day for men and 1 drink a day for women. Too much alcohol can cause health problems.     Manage other health problems such as diabetes, high blood pressure, and high cholesterol. If you think you may have a problem with alcohol or drug use, talk to your doctor.   Medicines    Take your medicines exactly as prescribed. Call your doctor if you think you are having a problem with your medicine.     If your doctor recommends aspirin, take the amount directed each day. Make sure you take aspirin and not another kind of pain reliever, such as acetaminophen (Tylenol).   When should you call for help?   Call 911 if you have symptoms of a heart attack. These may include:    Chest pain or pressure, or a strange feeling in the chest.     Sweating.     Shortness of breath.     Pain, pressure, or a strange feeling in the back, neck, jaw, or upper belly or in one or both shoulders or  arms.     Lightheadedness or sudden weakness.     A fast or irregular heartbeat.   After you call 911, the operator may tell you to chew 1 adult-strength or 2 to 4 low-dose aspirin. Wait for an ambulance. Do not try to drive yourself.  Watch closely for changes in your health, and be sure to contact your doctor if you have any problems.  Where can you learn more?  Go to [employer] and enter F075 to learn more about A Healthy Heart: Care Instructions.  Current as of: March 22, 2023  Content Version: 14.5   [address], [place].   Care instructions adapted under license by [facility]. If you have questions about a medical condition or this instruction, always ask your healthcare professional. Romayne Alderman, [facility], disclaims any warranty or liability for your use of this information.    Personalized Preventive Plan for NITA WHITMIRE - 03/15/2024  Medicare offers a range of preventive health benefits. Some of the tests and screenings are paid in full while other may be subject to a deductible, co-insurance, and/or copay.  Some of these benefits include a comprehensive review of your medical history including lifestyle, illnesses that may run in your family, and various assessments and screenings as appropriate.  After reviewing your medical record and screening and assessments performed today your provider may have ordered immunizations, labs, imaging, and/or referrals for you.  A list of these orders (if applicable) as well as your Preventive Care list are included within your After Visit Summary for your review.

## 2024-03-26 DIAGNOSIS — S92324A Nondisplaced fracture of second metatarsal bone, right foot, initial encounter for closed fracture: Secondary | ICD-10-CM | POA: Diagnosis not present

## 2024-04-19 DIAGNOSIS — S92351D Displaced fracture of fifth metatarsal bone, right foot, subsequent encounter for fracture with routine healing: Secondary | ICD-10-CM | POA: Diagnosis not present

## 2024-05-24 ENCOUNTER — Inpatient Hospital Stay
Admit: 2024-05-24 | Discharge: 2024-05-24 | Disposition: A | Discharge status: Home / Self Care | Payer: MEDICARE | Arrived: WI | Attending: Emergency Medicine

## 2024-05-24 ENCOUNTER — Emergency Department: Admit: 2024-05-24 | Payer: MEDICARE | Primary: Internal Medicine

## 2024-05-24 DIAGNOSIS — J189 Pneumonia, unspecified organism: Secondary | ICD-10-CM

## 2024-05-24 DIAGNOSIS — R600 Localized edema: Principal | ICD-10-CM

## 2024-05-24 LAB — COMPREHENSIVE METABOLIC PANEL
ALT: 34 U/L (ref 0–42)
AST: 36 U/L (ref 0–46)
Albumin/Globulin Ratio: 1.6 (ref 1.00–2.70)
Albumin: 4.1 g/dL (ref 3.5–5.2)
Alk Phosphatase: 78 U/L (ref 35–117)
Anion Gap: 11 mmol/L (ref 2–17)
BUN: 26 mg/dL — ABNORMAL HIGH (ref 8–23)
CO2: 26 mmol/L (ref 22–29)
Calcium: 9.1 mg/dL (ref 8.5–10.7)
Chloride: 103 mmol/L (ref 98–107)
Creatinine: 0.7 mg/dL (ref 0.5–1.0)
Est, Glom Filt Rate: 88 mL/min/1.73m (ref >=60)
Globulin: 2.6 g/dL (ref 1.9–4.4)
Glucose: 110 mg/dL — ABNORMAL HIGH (ref 70–99)
Osmolaliy Calculated: 285 mosm/kg (ref 270–287)
Potassium: 4 mmol/L (ref 3.5–5.3)
Sodium: 140 mmol/L (ref 135–145)
Total Bilirubin: 0.26 mg/dL (ref 0.00–1.20)
Total Protein: 6.7 g/dL (ref 5.7–8.3)

## 2024-05-24 LAB — CBC WITH AUTO DIFFERENTIAL
Basophils %: 0.5 % (ref 0.0–2.0)
Basophils Absolute: 0 x10e3/mcL (ref 0.0–0.2)
Eosinophils %: 1.7 % (ref 0.0–7.0)
Eosinophils Absolute: 0.1 x10e3/mcL (ref 0.0–0.5)
Hematocrit: 34.1 % (ref 34.0–47.0)
Hemoglobin: 11 g/dL — ABNORMAL LOW (ref 11.5–15.7)
Immature Grans (Abs): 0.01 x10e3/mcL (ref 0.00–0.06)
Immature Granulocytes %: 0.2 % (ref 0.0–0.6)
Lymphocytes Absolute: 2 x10e3/mcL (ref 1.0–3.2)
Lymphocytes: 30.8 % (ref 15.0–45.0)
MCH: 30.3 pg (ref 27.0–34.5)
MCHC: 32.3 g/dL (ref 30.0–36.0)
MCV: 93.9 fL (ref 81.0–99.0)
MPV: 8.9 fL (ref 7.0–12.2)
Monocytes %: 12.5 % — ABNORMAL HIGH (ref 4.0–12.0)
Monocytes Absolute: 0.8 x10e3/mcL (ref 0.3–1.0)
NRBC Absolute: 0 x10e3/mcL (ref 0.000–0.012)
NRBC Automated: 0 % (ref 0.0–0.2)
Neutrophils %: 54.3 % (ref 42.0–74.0)
Neutrophils Absolute: 3.5 x10e3/mcL (ref 1.6–7.3)
Platelets: 270 x10e3/mcL (ref 140–440)
RBC: 3.63 x10e6/mcL (ref 3.60–5.20)
RDW: 13.8 % (ref 10.0–17.0)
WBC: 6.5 x10e3/mcL (ref 3.8–10.6)

## 2024-05-24 LAB — BRAIN NATRIURETIC PEPTIDE: NT Pro-BNP: 183 pg/mL (ref 0–450)

## 2024-05-24 LAB — D-DIMER, QUANTITATIVE: D-Dimer, Quant: 0.37 CD:295178345 (ref 0.19–0.50)

## 2024-05-24 MED ORDER — AMOXICILLIN-POT CLAVULANATE 875-125 MG PO TABS
875-125 | Freq: Once | ORAL | Status: AC
Start: 2024-05-24 — End: 2024-05-24
  Administered 2024-05-24: 1 via ORAL

## 2024-05-24 MED ORDER — AMOXICILLIN-POT CLAVULANATE 875-125 MG PO TABS
875-125 | ORAL_TABLET | Freq: Two times a day (BID) | ORAL | 0 refills | 10.00000 days | Status: AC
Start: 2024-05-24 — End: 2024-06-03

## 2024-05-24 MED FILL — AMOXICILLIN-POT CLAVULANATE 875-125 MG PO TABS: 875-125 mg | ORAL | Qty: 1 | Fill #0

## 2024-05-24 NOTE — Discharge Instructions (Signed)
"  Please wear compression socks and keep your legs elevated when possible.  Follow-up with your primary care physician for further evaluation of your leg swelling.  "

## 2024-05-24 NOTE — ED Provider Notes (Signed)
 "[facility]  EMERGENCY DEPARTMENT ENCOUNTER      Pt Name: Diana [name]  MRN: [id number]  Birthdate March 09, 1946  Date of evaluation: 05/24/2024  Provider evaluation time: 05/24/24 1640  Provider: Richerd CHRISTELLA Shy, PA-C    CHIEF COMPLAINT       Chief Complaint   Patient presents with    Leg Swelling     Pt c/o bilateral leg swelling x1 month. Denies any SOB or CP but does have a cough. States feels very tired all the time. Denies CHF history         HISTORY OF PRESENT ILLNESS    HPI   78 y.o. female with PMH HLD, hypothyroidism, GERD/PUD presents to ED c/o bilateral lower leg and ankle swelling that has been present for the past month that has been gradually worsening.  She also reports that she has had a persistent cough that started around the same time but denies shortness of breath or chest pain.  She does report some general fatigue.  She denies any prior history of heart failure.  She is not on any diuretics.  She reports the swelling in the left lower leg has been worse than the right recently.  Denies any extremity pain or discoloration.  Denies extremity nausea tingling or weakness.  Denies any open wounds.  Denies any fevers or chills.      REVIEW OF SYSTEMS       Review of Systems   All other systems reviewed and are negative.      PAST MEDICAL HISTORY     Past Medical History:   Diagnosis Date    Anemia     Arthritis     Bleeding ulcer     DDD (degenerative disc disease), lumbar     History of blood transfusion     Hypertension     Hypothyroidism     Osteoporosis     Restless legs syndrome     Many years ago    Scoliosis        SURGICAL HISTORY       Past Surgical History:   Procedure Laterality Date    JOINT REPLACEMENT Left 08/2014    KNEE ARTHROSCOPY Left 2010    NOSE SURGERY      RESET AFTER BEING BROKEN    TOTAL HIP ARTHROPLASTY  08/23/2015    TOTAL KNEE ARTHROPLASTY Right 04/2018    TUBAL LIGATION         CURRENT MEDICATIONS       Current Discharge Medication List        CONTINUE these  medications which have NOT CHANGED    Details   pramipexole  (MIRAPEX ) 1 MG tablet Take 1.5 tablets by mouth every evening 1.5 TABLETS AT BEDTIME ORALLY 90 DAYS  Qty: 135 tablet, Refills: 3    Associated Diagnoses: Restless leg syndrome      levothyroxine  (SYNTHROID ) 50 MCG tablet Take 1 tablet by mouth daily  Qty: 90 tablet, Refills: 3    Associated Diagnoses: Acquired hypothyroidism      DULoxetine  (CYMBALTA ) 60 MG extended release capsule Take 1 capsule by mouth daily      Multiple Vitamin (MULTIVITAMIN ADULT PO)       vitamin B-6 (PYRIDOXINE) 100 MG tablet Take 1 tablet by mouth daily      vitamin B-12 (CYANOCOBALAMIN) 1000 MCG tablet Take 1 tablet by mouth daily      magnesium oxide (MAG-OX) 140 MG CAPS 500 mg      acetaminophen (  TYLENOL) 650 MG extended release tablet       traZODone (DESYREL) 100 MG tablet TAKE 2 TABLETS BY MOUTH EVERY DAY AT BEDTIME             ALLERGIES     2,4-d dimethylamine; Amitriptyline hcl; Aspirin; Codeine; Pseudoeph-bromphen-cod; and Pseudoephedrine    FAMILY HISTORY       Family History   Problem Relation Age of Onset    Diabetes Mother     Stroke Mother     Rheum Arthritis Mother     Coronary Art Dis Mother     Arthritis Mother     Rheumatologic Disease Mother     Heart Disease Father     Alzheimer's Disease Father     Coronary Art Dis Father     No Known Problems Brother     No Known Problems Daughter     Atrial Fibrillation Brother         SOCIAL HISTORY       Social History     Socioeconomic History    Marital status: Married   Tobacco Use    Smoking status: Never    Smokeless tobacco: Never   Vaping Use    Vaping status: Never Used   Substance and Sexual Activity    Alcohol use: Yes     Alcohol/week: 3.0 standard drinks of alcohol     Types: 3 Glasses of wine per week    Drug use: Never    Sexual activity: Yes     Partners: Male     Social Drivers of Health     Physical Activity: Inactive (03/14/2024)    Exercise Vital Sign     Days of Exercise per Week: 0 days     Minutes of  Exercise per Session: 10 min       PHYSICAL EXAM     Vitals:    Vitals:    05/24/24 1516   BP: (!) 142/67   Pulse: 84   Resp: 18   Temp: 98.1 F (36.7 C)   TempSrc: Oral   SpO2: 99%   Weight: 59 kg (130 lb)   Height: 1.626 m (5' 4)       Physical Exam  Vitals and nursing note reviewed.   Constitutional:       General: She is not in acute distress.     Appearance: Normal appearance. She is not ill-appearing, toxic-appearing or diaphoretic.   HENT:      Head: Normocephalic and atraumatic.      Mouth/Throat:      Mouth: Mucous membranes are moist.   Eyes:      Extraocular Movements: Extraocular movements intact.      Pupils: Pupils are equal, round, and reactive to light.   Cardiovascular:      Rate and Rhythm: Normal rate. Rhythm irregular.      Pulses: Normal pulses.   Pulmonary:      Effort: Pulmonary effort is normal. No respiratory distress.      Breath sounds: Normal breath sounds.   Abdominal:      General: Bowel sounds are normal. There is no distension.      Palpations: Abdomen is soft.      Tenderness: There is no abdominal tenderness.   Musculoskeletal:      Right lower leg: No tenderness. Edema present.      Left lower leg: No tenderness. Edema present.      Comments: Bilateral lower leg edema extending to ankles, L>R.  No tenderness.  NVI distally.  No signs of infection.   Skin:     General: Skin is warm and dry.      Capillary Refill: Capillary refill takes less than 2 seconds.   Neurological:      General: No focal deficit present.      Mental Status: She is alert and oriented to person, place, and time.         PROCEDURES:  Procedures    DIAGNOSTIC RESULTS       RADIOLOGY:   Vascular duplex lower extremity venous left         XR CHEST PORTABLE   Final Result   Subtle left basilar groundglass opacity which may represent atelectasis versus    pneumonitis.             LABS:  Labs Reviewed   CBC WITH AUTO DIFFERENTIAL - Abnormal; Notable for the following components:       Result Value    Hemoglobin 11.0  (*)     Monocytes % 12.5 (*)     All other components within normal limits   COMPREHENSIVE METABOLIC PANEL - Abnormal; Notable for the following components:    Glucose 110 (*)     BUN 26 (*)     All other components within normal limits   D-DIMER, QUANTITATIVE   BRAIN NATRIURETIC PEPTIDE       All other labs were within normal range or not returned as of this dictation.    EMERGENCY DEPARTMENT COURSE/MDM:   MDM     Amount and/or Complexity of Data Reviewed  Clinical lab tests: reviewed  Tests in the radiology section of CPT: reviewed  Tests in the medicine section of CPT: reviewed    Patient is overall very well-appearing in NAD and is hemodynamically stable.  On exam there is bilateral lower leg edema extending to ankles, L>R.  No tenderness.  NVI distally.  No signs of infection.  Breath sounds clear.  Will obtain CBC and chemistries, BNP and CXR to evaluate for signs of heart failure, and also obtain left lower extremity vascular ultrasound to rule out DVT due to obviously increased left leg swelling over right on exam.     CBC is unremarkable.  CMP, BNP, and D-dimer are in process and are pending results.  CXR obtained and pending results. I discussed with vascular US  tech regarding preliminary results of vascular US . US  is negative for DVT. There is a very small fleck of a superficial thrombophlebitis of saphenous vein on the left but otherwise negative study.    Patient signed out to ED attending physician pending completion of workup.     ED Course as of 05/24/24 1928   Fri May 24, 2024   1801 I reviewed the patient's medical history, the PA/NP's findings on physical examination, the patient's diagnoses, and treatment plan as documented in the PA/NP note.  I concur with the treatment plan as documented.  Additional suggestions noted.  Assumed primary care of the patient at the end of the PA shift to follow-up the remainder of the labs and reviewed the chest x-ray.  PA spoke with the vascular tech and the  lower extremity Doppler was negative for DVT. [[place]]   1803 D-Dimer, Quant: 0.37 [[place]]   1803 WBC: 6.5 [KY]   1803 Hemoglobin Quant(!): 11.0 [[place]]   1857 NT Pro-BNP: 183 [[place]]   1857 Sodium: 140 [KY]   1857 Potassium: 4.0 [KY]   1857 Chloride: 103 [KY]  1857 BUN,BUNPL(!): 26 [[place]]   1857 Creatinine: 0.7 [KY]   1857 Total Bilirubin: 0.26 [KY]   1857 AST: 36 [[place]]   1857 ALT: 34 [KY]   1925 Patient was updated on the negative vascular ultrasound for DVT.  Normal labs.  Pneumonitis on chest x-ray.  Will start Augmentin  and plan for discharge.  Patient was encouraged to use compression socks and to keep her feet elevated when possible.  She will follow-up with her primary care [[place]]      ED Course User Index  [[place]] Neysa Andrez Jansky, DO       CONSULTS:  None      FINAL IMPRESSION      1. Lower leg edema    2. Acute cough    3. Bilateral lower extremity edema    4. Pneumonia of left lower lobe due to infectious organism          DISPOSITION/PLAN   DISPOSITION Decision To Discharge 05/24/2024 07:26:45 PM   DISPOSITION CONDITION Stable           PATIENT REFERRED TO:  Alfreida Pulling, MD  [address]  [address]  [place] [address]  [phone]    Schedule an appointment as soon as possible for a visit       [facility]  [address]  [place]  [address]  [phone]    As needed, If symptoms worsen      DISCHARGE MEDICATIONS:  Current Discharge Medication List        START taking these medications    Details   amoxicillin -clavulanate (AUGMENTIN ) 875-125 MG per tablet Take 1 tablet by mouth 2 times daily for 10 days  Qty: 20 tablet, Refills: 0               (Please note that portions of this note were completed with a voice recognition program.  Efforts were made to edit the dictations but occasionally words are mis-transcribed.)    Andrez Jansky Neysa, DO (electronically signed)             Reuben Richerd HERO, PA-C  05/24/24 1801       Reuben Richerd HERO, PA-C  05/24/24 1802       Neysa Andrez Jansky, DO  05/24/24 1928    "

## 2024-05-25 LAB — EKG 12-LEAD
P Axis: 102 degrees
P-R Interval: 144 ms
Q-T Interval: 408 ms
QRS Duration: 88 ms
QTc Calculation (Bazett): 413 ms
R Axis: 23 degrees
T Axis: 45 degrees
Ventricular Rate: 62 {beats}/min

## 2024-05-25 LAB — VAS DUP LOWER EXTREMITY VENOUS LEFT: Body Surface Area: 1.63 m2

## 2024-05-28 ENCOUNTER — Ambulatory Visit: Admit: 2024-05-28 | Discharge: 2024-05-28 | Payer: MEDICARE | Attending: Internal Medicine | Primary: Internal Medicine

## 2024-05-28 VITALS — BP 120/70 | HR 70 | Ht 64.0 in | Wt 134.6 lb

## 2024-05-28 DIAGNOSIS — F32A Depression, unspecified: Principal | ICD-10-CM

## 2024-05-28 MED ORDER — DULOXETINE HCL 60 MG PO CPEP
60 | ORAL_CAPSULE | Freq: Every day | ORAL | 1 refills | Status: AC
Start: 2024-05-28 — End: ?

## 2024-05-28 NOTE — Progress Notes (Signed)
 "CHIEF COMPLAINT:  Chief Complaint   Patient presents with    Follow-Up from [facility] ER for both legs and ankles and feet are swollen as well as painful     Cough     Cough started several week and have calm down since she was on antibiotic .        HISTORY OF PRESENT ILLNESS:  [name] is a 78 y.o. female    History of Present Illness  The patient is a 78 year old woman who presents for emergency room follow-up. She presented to the [facility] emergency department on 05/24/2024 for bilateral leg swelling that had been gradually worsening over a month. She also noted a persistent cough and fatigue.    The ankle swelling has been present for a month and has been gradually worsening. Her ankles are less swollen today, with the right ankle being more affected than the left. The swelling extends up her legs and is accompanied by pain. She sought medical attention primarily to rule out a blood clot. 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 She finds it challenging to wear narrow-leg pants due to the swelling.    She also reports a worsening dry cough at night, which was one of the reasons for her visit to the emergency department. She has been experiencing fatigue and difficulty typing on the computer when seated. She does not recall having a fever but mentions feeling feverish. She has been resting and driving her husband to his physical therapy sessions, during which she takes a nap in the car. She tends to fall asleep and wakes up early in the morning, sometimes returning to bed if she feels unable to function. If she feels alert, she gets dressed and then lies down. She also reports a runny nose but does not feel congested. She is currently on Augmentin  and reports feeling slightly better.    She expresses concern about weight gain, which she first noticed about 2 months ago, even  though she has not made any lifestyle changes. She reports normal bowel movements.    Sleep: Reports difficulty sleeping at night, waking up early in the morning, and sometimes returning to bed if unable to function.       PHQ:      03/15/2024     2:43 PM   PHQ-9    Little interest or pleasure in doing things 0   Feeling down, depressed, or hopeless 0   Trouble falling or staying asleep, or sleeping too much 0   Feeling tired or having little energy 0   Poor appetite or overeating 0   Feeling bad about yourself - or that you are a failure or have let yourself or your family down 0   Trouble concentrating on things, such as reading the newspaper or watching television 0   Moving or speaking so slowly that other people could have noticed. Or the opposite - being so fidgety or restless that you have been moving around a lot more than usual 0   Thoughts that you would be better off dead, or of hurting yourself in some way 0   PHQ-2 Score 0   PHQ-9 Total Score 0   If you checked off any problems, how difficult have these problems made it for you to do your work, take care of things  at home, or get along with other people? 0       Past Medical History:   Diagnosis Date    Anemia     Arthritis     Bleeding ulcer     DDD (degenerative disc disease), lumbar     History of blood transfusion     Hypertension     Hypothyroidism     Osteoporosis     Restless legs syndrome     Many years ago    Scoliosis       Past Surgical History:   Procedure Laterality Date    JOINT REPLACEMENT Left 08/2014    KNEE ARTHROSCOPY Left 2010    NOSE SURGERY      RESET AFTER BEING BROKEN    TOTAL HIP ARTHROPLASTY  08/23/2015    TOTAL KNEE ARTHROPLASTY Right 04/2018    TUBAL LIGATION        Social History     Socioeconomic History    Marital status: Married     Spouse name: None    Number of children: None    Years of education: None    Highest education level: None   Tobacco Use    Smoking status: Never    Smokeless tobacco: Never   Vaping Use     Vaping status: Never Used   Substance and Sexual Activity    Alcohol use: Yes     Alcohol/week: 3.0 standard drinks of alcohol     Types: 3 Glasses of wine per week    Drug use: Never    Sexual activity: Yes     Partners: Male     Social Drivers of Health     Physical Activity: Inactive (03/14/2024)    Exercise Vital Sign     Days of Exercise per Week: 0 days     Minutes of Exercise per Session: 10 min       Current Outpatient Medications   Medication Instructions    acetaminophen (TYLENOL) 650 MG extended release tablet No dose, route, or frequency recorded.    amoxicillin -clavulanate (AUGMENTIN ) 875-125 MG per tablet 1 tablet, Oral, 2 TIMES DAILY    DULoxetine  (CYMBALTA ) 60 mg, Oral, DAILY    levothyroxine  (SYNTHROID ) 50 mcg, Oral, DAILY    magnesium oxide (MAG-OX) 500 mg    Multiple Vitamin (MULTIVITAMIN ADULT PO)     pramipexole  (MIRAPEX ) 1.5 mg, Oral, EVERY EVENING, 1.5 TABLETS AT BEDTIME ORALLY 90 DAYS    traZODone (DESYREL) 100 MG tablet TAKE 2 TABLETS BY MOUTH EVERY DAY AT BEDTIME    vitamin B-12 (CYANOCOBALAMIN) 1,000 mcg, DAILY    vitamin B-6 (PYRIDOXINE) 100 mg, DAILY             Review of Systems     BP 120/70   Pulse 70   Ht 1.626 m (5' 4)   Wt 61.1 kg (134 lb 9.6 oz)   SpO2 97%   BMI 23.10 kg/m     Objective   Physical Exam      Physical Exam  Respiratory: Clear to auscultation, no wheezing, rales or rhonchi  Gastrointestinal: Soft, no tenderness, no distention, no masses. Good bowel sounds. Mild tenderness upon palpation.  Extremities: Left leg is slightly larger than the right.       Lab Results   Component Value Date    WBC 6.5 05/24/2024    HGB 11.0 (L) 05/24/2024    HCT 34.1 05/24/2024    MCV 93.9 05/24/2024    PLT 270 05/24/2024  Lab Results   Component Value Date/Time    NA 140 05/24/2024 05:35 PM    K 4.0 05/24/2024 05:35 PM    CL 103 05/24/2024 05:35 PM    CO2 26 05/24/2024 05:35 PM    BUN 26 05/24/2024 05:35 PM    CREATININE 0.7 05/24/2024 05:35 PM    GLUCOSE 110 05/24/2024 05:35 PM     CALCIUM 9.1 05/24/2024 05:35 PM      Lab Results   Component Value Date    CHOL 212 (H) 02/02/2024    CHOL 199 08/31/2023    CHOL 200 02/24/2023     Lab Results   Component Value Date    TRIG 59 02/02/2024    TRIG 57 08/31/2023    TRIG 57 02/24/2023     Lab Results   Component Value Date    HDL 85 02/02/2024    HDL 89 08/31/2023    HDL 67 02/24/2023     No components found for: LDLCHOLESTEROL, LDLCALC  Lab Results   Component Value Date    VLDL 11.8 02/02/2024    VLDL 11.4 08/31/2023    VLDL 11.4 02/24/2023     Lab Results   Component Value Date    CHOLHDLRATIO 2.5 02/02/2024    CHOLHDLRATIO 2.2 08/31/2023    CHOLHDLRATIO 3.0 02/24/2023     No results found for: PSA  Lab Results   Component Value Date    TSH 9.870 (H) 02/02/2024       IMPRESSION/PLAN  1. Depressive disorder  -     DULoxetine  (CYMBALTA ) 60 MG extended release capsule; Take 1 capsule by mouth daily, Disp-90 capsule, R-1Normal  2. Leg swelling  3. Acute cough  4. Other fatigue    Assessment & Plan  1. Bilateral leg swelling:  - The patient presented to the emergency department on 05/24/2024 for bilateral leg swelling persisting for a month. The swelling has improved, with the left leg still slightly larger than the right.  - An ultrasound ruled out DVT but showed a small fleck of superficial thrombophlebitis.  - The patient is advised to continue monitoring the swelling and report any significant changes.    2. Cough:  - The patient reported a persistent dry cough, which worsens at night.  - A chest x-ray indicated a subtle left basilar ground glass opacity, suggesting possible atelectasis or pneumonitis.  - The patient is currently on Augmentin  and reports feeling slightly better.  - The patient is advised to complete the antibiotic course and rest as needed. If symptoms worsen or do not improve, further evaluation will be necessary.    3. Fatigue:  - The patient reports feeling very tired and has difficulty staying awake during the day.  -  The patient is advised to take afternoon naps and rest as needed.  - If fatigue persists, further evaluation may be necessary.    4. Weight gain:  - The patient has experienced unexplained weight gain over the past two months.  - The patient is advised to monitor her weight and report any significant changes.    Follow-up: The patient will follow up in January 2026 or sooner if necessary.      Follow up and Dispositions:  No follow-ups on file.     The patient (or guardian, if applicable) and other individuals in attendance with the patient were advised that Artificial Intelligence will be utilized during this visit to record, process the conversation to generate a clinical note, and support improvement of  the AI technology. The patient (or guardian, if applicable) and other individuals in attendance at the appointment consented to the use of AI, including the recording.         Devere Leghorn, MD     An electronic signature was used to authenticate this note.  "

## 2024-07-02 ENCOUNTER — Other Ambulatory Visit: Payer: Self-pay | Admitting: Internal Medicine

## 2024-07-02 DIAGNOSIS — Z1231 Encounter for screening mammogram for malignant neoplasm of breast: Secondary | ICD-10-CM

## 2024-07-26 ENCOUNTER — Ambulatory Visit
Admission: RE | Admit: 2024-07-26 | Discharge: 2024-07-26 | Disposition: A | Source: Ambulatory Visit | Attending: Internal Medicine | Admitting: Internal Medicine

## 2024-07-26 DIAGNOSIS — Z1231 Encounter for screening mammogram for malignant neoplasm of breast: Secondary | ICD-10-CM

## 2024-09-14 ENCOUNTER — Inpatient Hospital Stay: Admit: 2024-09-14 | Payer: MEDICARE | Primary: Internal Medicine

## 2024-09-14 DIAGNOSIS — E039 Hypothyroidism, unspecified: Secondary | ICD-10-CM

## 2024-09-14 LAB — CBC WITH AUTO DIFFERENTIAL
Basophils %: 0.4 % (ref 0.0–2.0)
Basophils Absolute: 0 10*3/uL (ref 0.0–0.2)
Eosinophils %: 1.6 % (ref 0.0–7.0)
Eosinophils Absolute: 0.1 10*3/uL (ref 0.0–0.5)
Hematocrit: 37.1 % (ref 34.0–47.0)
Hemoglobin: 12.1 g/dL (ref 11.5–15.7)
Immature Grans (Abs): 0.01 10*3/uL (ref 0.00–0.06)
Immature Granulocytes %: 0.2 % (ref 0.0–0.6)
Lymphocytes Absolute: 1.6 10*3/uL (ref 1.0–3.2)
Lymphocytes: 28.6 % (ref 15.0–45.0)
MCH: 30.9 pg (ref 27.0–34.5)
MCHC: 32.6 g/dL (ref 30.0–36.0)
MCV: 94.6 fL (ref 81.0–99.0)
MPV: 9.2 fL (ref 7.0–12.2)
Monocytes %: 10.7 % (ref 4.0–12.0)
Monocytes Absolute: 0.6 10*3/uL (ref 0.3–1.0)
Neutrophils %: 58.5 % (ref 42.0–74.0)
Neutrophils Absolute: 3.2 10*3/uL (ref 1.6–7.3)
Platelets: 295 10*3/uL (ref 140–440)
RBC: 3.92 x10e6/mcL (ref 3.60–5.20)
RDW: 13.9 % (ref 10.0–17.0)
WBC: 5.5 10*3/uL (ref 3.8–10.6)

## 2024-09-14 LAB — COMPREHENSIVE METABOLIC PANEL
ALT: 27 U/L (ref 0–42)
AST: 33 U/L (ref 0–46)
Albumin/Globulin Ratio: 2 (ref 1.00–2.70)
Albumin: 4.4 g/dL (ref 3.5–5.2)
Alk Phosphatase: 86 U/L (ref 35–117)
Anion Gap: 10 mmol/L (ref 2–17)
BUN: 20 mg/dL (ref 8–23)
CALCIUM,CORRECTED,CCA: 9.6 mg/dL (ref 8.5–10.7)
CO2: 28 mmol/L (ref 22–29)
Calcium: 9.9 mg/dL (ref 8.5–10.7)
Chloride: 103 mmol/L (ref 98–107)
Creatinine: 0.7 mg/dL (ref 0.5–1.0)
Est, Glom Filt Rate: 88 mL/min/{1.73_m2} (ref >=60)
Globulin: 2.2 g/dL (ref 1.9–4.4)
Glucose: 86 mg/dL (ref 70–99)
Osmolaliy Calculated: 283 mosm/kg (ref 270–287)
Potassium: 4.6 mmol/L (ref 3.5–5.3)
Sodium: 141 mmol/L (ref 135–145)
Total Bilirubin: 0.4 mg/dL (ref 0.00–1.20)
Total Protein: 6.6 g/dL (ref 5.7–8.3)

## 2024-09-14 LAB — TSH REFLEX TO FT4: TSH: 10.9 u[IU]/mL — ABNORMAL HIGH (ref 0.358–3.740)

## 2024-09-14 LAB — T4, FREE: T4 Free: 1.26 ng/dL (ref 0.82–1.70)

## 2024-09-16 ENCOUNTER — Ambulatory Visit: Admit: 2024-09-16 | Discharge: 2024-09-16 | Payer: MEDICARE | Attending: Internal Medicine | Primary: Internal Medicine

## 2024-09-16 VITALS — BP 138/74 | HR 72 | Wt 136.4 lb

## 2024-09-16 DIAGNOSIS — E673 Hypervitaminosis D: Principal | ICD-10-CM

## 2024-09-16 MED ORDER — LEVOTHYROXINE SODIUM 75 MCG PO TABS
75 | ORAL_TABLET | Freq: Every day | ORAL | 3 refills | Status: AC
Start: 2024-09-16 — End: ?

## 2024-09-16 NOTE — Progress Notes (Signed)
 CHIEF COMPLAINT:  Chief Complaint   Patient presents with    6 Month Follow-Up     Patient has complaints of low energy.  Patient has trouble staying asleep  Patient would like for Dr. Alfreida to manage her trazodone and Duloxetine         HISTORY OF PRESENT ILLNESS:  Diana [name] is a 79 y.o. female    History of Present Illness  The patient is a 79 year old female who presents for evaluation of sleep issues, thyroid management, and shin pain. She is accompanied by her husband.    She reports experiencing sleep disturbances, characterized by waking up in the middle of the night and engaging in activities such as dressing and preparing for work, only to realize upon checking the time that it is still early morning. She then returns to bed and resumes sleep. Her total sleep duration per night is approximately 4 to 5 hours. She also mentions instances of daytime sleepiness.     She has been adhering to her prescribed levothyroxine  regimen and takes B12 supplements. She maintains a healthy diet, inclusive of vegetables and proteins. She has not experienced any stroke or heart attack and does not have diabetes.    She experiences pain in her legs, specifically from the knees down, which is more pronounced in the left leg. This pain is most severe in the mornings. She also reports symptoms of restless leg syndrome. In spite of performing stretches, the pain persists, although she is able to bear weight on her leg.    Diet: Healthy diet inclusive of vegetables and proteins  Sleep: Approximately 4 to 5 hours per night, instances of daytime sleepiness       PHQ:      09/15/2024    10:14 AM   PHQ-9    Little interest or pleasure in doing things 0   Feeling down, depressed, or hopeless 1   Trouble falling or staying asleep, or sleeping too much 0   Feeling tired or having little energy 3   Poor appetite or overeating 1   Feeling bad about yourself - or that you are a failure or have let yourself or your family down 0   Trouble  concentrating on things, such as reading the newspaper or watching television 2   Moving or speaking so slowly that other people could have noticed. Or the opposite - being so fidgety or restless that you have been moving around a lot more than usual 0   Thoughts that you would be better off dead, or of hurting yourself in some way 0   PHQ-2 Score 1    PHQ-9 Total Score 7    If you checked off any problems, how difficult have these problems made it for you to do your work, take care of things at home, or get along with other people? 1       Patient-reported       Past Medical History:   Diagnosis Date    Anemia     Anxiety     Arthritis     Bleeding ulcer     DDD (degenerative disc disease), lumbar     Depression     History of blood transfusion     Hypertension     Hypothyroidism     Neuropathy     Osteoarthritis     Osteoporosis     Restless legs syndrome     Many years ago    Scoliosis  Past Surgical History:   Procedure Laterality Date    COLONOSCOPY      JOINT REPLACEMENT Left 08/2014    KNEE ARTHROSCOPY Left 2010    NOSE SURGERY      RESET AFTER BEING BROKEN    TOTAL HIP ARTHROPLASTY  08/23/2015    TOTAL KNEE ARTHROPLASTY Right 04/2018    TUBAL LIGATION        Social History     Socioeconomic History    Marital status: Married     Spouse name: None    Number of children: None    Years of education: None    Highest education level: None   Tobacco Use    Smoking status: Never    Smokeless tobacco: Never   Vaping Use    Vaping status: Never Used   Substance and Sexual Activity    Alcohol use: Yes     Alcohol/week: 3.0 standard drinks of alcohol     Types: 3 Glasses of wine per week    Drug use: Never    Sexual activity: Yes     Partners: Male     Social Drivers of Health     Physical Activity: Inactive (03/14/2024)    Exercise Vital Sign     Days of Exercise per Week: 0 days     Minutes of Exercise per Session: 10 min       Current Outpatient Medications   Medication Instructions    acetaminophen (TYLENOL)  650 MG extended release tablet No dose, route, or frequency recorded.    DULoxetine  (CYMBALTA ) 60 mg, Oral, DAILY    levothyroxine  (SYNTHROID ) 75 mcg, Oral, DAILY    magnesium oxide (MAG-OX) 500 mg    Multiple Vitamin (MULTIVITAMIN ADULT PO)     pramipexole  (MIRAPEX ) 1.5 mg, Oral, EVERY EVENING, 1.5 TABLETS AT BEDTIME ORALLY 90 DAYS    traZODone (DESYREL) 100 MG tablet TAKE 2 TABLETS BY MOUTH EVERY DAY AT BEDTIME    vitamin B-12 (CYANOCOBALAMIN) 1,000 mcg, DAILY    vitamin B-6 (PYRIDOXINE) 100 mg, DAILY             Review of Systems     BP 138/74   Pulse 72   Wt 61.9 kg (136 lb 6.4 oz)   SpO2 97%   BMI 23.41 kg/m     Objective   Physical Exam      Physical Exam  Neck: No thyroid enlargement  Respiratory: Clear to auscultation, no wheezing, rales or rhonchi  Extremities: Left leg slightly more swollen than the right, no pitting edema       Lab Results   Component Value Date    WBC 5.5 09/14/2024    HGB 12.1 09/14/2024    HCT 37.1 09/14/2024    MCV 94.6 09/14/2024    PLT 295 09/14/2024     Lab Results   Component Value Date/Time    NA 141 09/14/2024 09:04 AM    K 4.6 09/14/2024 09:04 AM    CL 103 09/14/2024 09:04 AM    CO2 28 09/14/2024 09:04 AM    BUN 20 09/14/2024 09:04 AM    CREATININE 0.7 09/14/2024 09:04 AM    GLUCOSE 86 09/14/2024 09:04 AM    CALCIUM 9.9 09/14/2024 09:04 AM      Lab Results   Component Value Date    CHOL 212 (H) 02/02/2024    CHOL 199 08/31/2023    CHOL 200 02/24/2023     Lab Results   Component Value Date  TRIG 59 02/02/2024    TRIG 57 08/31/2023    TRIG 57 02/24/2023     Lab Results   Component Value Date    HDL 85 02/02/2024    HDL 89 08/31/2023    HDL 67 02/24/2023     No components found for: LDLCHOLESTEROL, [facility]  Lab Results   Component Value Date    VLDL 11.8 02/02/2024    VLDL 11.4 08/31/2023    VLDL 11.4 02/24/2023     Lab Results   Component Value Date    CHOLHDLRATIO 2.5 02/02/2024    CHOLHDLRATIO 2.2 08/31/2023    CHOLHDLRATIO 3.0 02/24/2023     No results found for:  PSA  Lab Results   Component Value Date    TSH 10.900 (H) 09/14/2024       IMPRESSION/PLAN  1. Hypervitaminosis D  -     Vitamin D 25 Hydroxy; Future  2. Acquired hypothyroidism  Comments:  Chronic, controlled, continue current medication  Orders:  -     levothyroxine  (SYNTHROID ) 75 MCG tablet; Take 1 tablet by mouth daily, Disp-90 tablet, R-3Normal  -     TSH reflex to FT4; Future  3. 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 Moderate major depression, single episode (HCC)  5. Dyslipidemia  -     Comprehensive Metabolic Panel; Future  -     Lipid Panel; Future    Assessment & Plan  1. Sleep issues:  - TSH levels are elevated. B12 levels are normal. Previous anemia has resolved. Liver function, kidney function, and blood glucose levels are normal.  - The dosage of levothyroxine  will be increased from 50 mcg to 75 mcg. A new prescription for Synthroid  75 mcg will be sent to the pharmacy. She can take 1.5 tablets of the 50 mcg if she can cut them, otherwise, she should get the 75 mcg tablets.    2. Shin pain:  - She was advised to perform leg stretches during the day and upon waking up. The use of a heating pad was also recommended.    Follow-up: The patient will follow up in 6 months for a wellness visit and lab recheck.      Follow up and Dispositions:  Return in about 6 months (around 03/16/2025) for SAWV.     The patient (or guardian, if applicable) and other individuals in attendance with the patient were advised that Artificial Intelligence will be utilized during this visit to record, process the conversation to generate a clinical note, and support improvement of the AI technology. The patient (or guardian, if applicable) and other individuals in attendance at the appointment consented to the use of AI, including the recording.         Devere Leghorn, MD     An electronic signature was used to authenticate  this note.

## 2024-09-26 ENCOUNTER — Encounter
